# Patient Record
Sex: Male | Born: 1950 | Race: Black or African American | Hispanic: No | Marital: Married | Smoking: Former smoker
Health system: Southern US, Community
[De-identification: ages and names within clinical notes are randomized; demographics above are authoritative.]

## PROBLEM LIST (undated history)

## (undated) DIAGNOSIS — R519 Headache, unspecified: Secondary | ICD-10-CM

## (undated) DIAGNOSIS — J189 Pneumonia, unspecified organism: Secondary | ICD-10-CM

## (undated) DIAGNOSIS — G4730 Sleep apnea, unspecified: Secondary | ICD-10-CM

## (undated) DIAGNOSIS — R972 Elevated prostate specific antigen [PSA]: Secondary | ICD-10-CM

## (undated) DIAGNOSIS — N529 Male erectile dysfunction, unspecified: Secondary | ICD-10-CM

## (undated) DIAGNOSIS — I10 Essential (primary) hypertension: Secondary | ICD-10-CM

## (undated) DIAGNOSIS — N400 Enlarged prostate without lower urinary tract symptoms: Secondary | ICD-10-CM

## (undated) DIAGNOSIS — J181 Lobar pneumonia, unspecified organism: Secondary | ICD-10-CM

## (undated) DIAGNOSIS — R51 Headache: Secondary | ICD-10-CM

## (undated) DIAGNOSIS — Z862 Personal history of diseases of the blood and blood-forming organs and certain disorders involving the immune mechanism: Secondary | ICD-10-CM

## (undated) DIAGNOSIS — T4145XA Adverse effect of unspecified anesthetic, initial encounter: Secondary | ICD-10-CM

## (undated) DIAGNOSIS — E119 Type 2 diabetes mellitus without complications: Secondary | ICD-10-CM

## (undated) DIAGNOSIS — R9389 Abnormal findings on diagnostic imaging of other specified body structures: Principal | ICD-10-CM

## (undated) DIAGNOSIS — T8859XA Other complications of anesthesia, initial encounter: Secondary | ICD-10-CM

## (undated) DIAGNOSIS — K219 Gastro-esophageal reflux disease without esophagitis: Secondary | ICD-10-CM

## (undated) HISTORY — PX: EYE SURGERY: SHX253

## (undated) HISTORY — PX: SHOULDER ARTHROSCOPY: SHX128

## (undated) HISTORY — PX: COLONOSCOPY: SHX174

## (undated) HISTORY — PX: ELBOW SURGERY: SHX618

## (undated) HISTORY — PX: ORTHOPEDIC SURGERY: SHX850

## (undated) HISTORY — PX: PROSTATE BIOPSY: SHX241

## (undated) HISTORY — DX: Pneumonia, unspecified organism: J18.9

## (undated) HISTORY — PX: TONSILLECTOMY: SUR1361

## (undated) HISTORY — DX: Male erectile dysfunction, unspecified: N52.9

## (undated) HISTORY — DX: Lobar pneumonia, unspecified organism: J18.1

## (undated) HISTORY — DX: Abnormal findings on diagnostic imaging of other specified body structures: R93.89

## (undated) HISTORY — PX: WRIST SURGERY: SHX841

## (undated) HISTORY — DX: Personal history of diseases of the blood and blood-forming organs and certain disorders involving the immune mechanism: Z86.2

---

## 2000-11-27 ENCOUNTER — Ambulatory Visit (HOSPITAL_COMMUNITY): Admission: RE | Admit: 2000-11-27 | Discharge: 2000-11-27 | Payer: Self-pay | Admitting: Family Medicine

## 2000-11-27 ENCOUNTER — Encounter: Payer: Self-pay | Admitting: Family Medicine

## 2003-06-06 ENCOUNTER — Ambulatory Visit (HOSPITAL_COMMUNITY): Admission: RE | Admit: 2003-06-06 | Discharge: 2003-06-06 | Payer: Self-pay | Admitting: Orthopedic Surgery

## 2003-06-06 ENCOUNTER — Ambulatory Visit (HOSPITAL_BASED_OUTPATIENT_CLINIC_OR_DEPARTMENT_OTHER): Admission: RE | Admit: 2003-06-06 | Discharge: 2003-06-06 | Payer: Self-pay | Admitting: Orthopedic Surgery

## 2004-05-11 ENCOUNTER — Ambulatory Visit (HOSPITAL_COMMUNITY): Admission: RE | Admit: 2004-05-11 | Discharge: 2004-05-11 | Payer: Self-pay | Admitting: Family Medicine

## 2004-08-27 ENCOUNTER — Ambulatory Visit (HOSPITAL_COMMUNITY): Admission: RE | Admit: 2004-08-27 | Discharge: 2004-08-27 | Payer: Self-pay | Admitting: General Surgery

## 2008-03-20 ENCOUNTER — Ambulatory Visit (HOSPITAL_COMMUNITY): Admission: RE | Admit: 2008-03-20 | Discharge: 2008-03-20 | Payer: Self-pay | Admitting: Orthopedic Surgery

## 2008-03-22 ENCOUNTER — Emergency Department (HOSPITAL_COMMUNITY): Admission: EM | Admit: 2008-03-22 | Discharge: 2008-03-22 | Payer: Self-pay | Admitting: Emergency Medicine

## 2010-06-21 NOTE — Op Note (Signed)
NAME:  Thomas Malone, Thomas Malone                          ACCOUNT NO.:  [id number]   MEDICAL RECORD NO.:  [id number]                   PATIENT TYPE:  AMB   LOCATION:  DSC                                  FACILITY:  [facility]   PHYSICIAN:  Leonides Grills, M.D.                  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   OPERATION PERFORMED:  1. Left subtalar distraction bone block fusion.  2. Left iliac crest bone graft.  3. Left sural nerve neurolysis.  4. Left ankle stress x-rays.   SURGEON:  Leonides Grills, M.D.   ASSISTANT:  Lianne Cure, P.A.   ANESTHESIA:  General endotracheal tube with popliteal block.   ESTIMATED BLOOD LOSS:  Minimal.   TOURNIQUET TIME:  Approximately 1-1/2 hours.   COMPLICATIONS:  None.   DISPOSITION:  Stable to PR.   INDICATIONS FOR PROCEDURE:  The patient is a 59 year old male who sustained  a calcaneus fracture which was treated conservatively.  It went on to  collapse with subtalar joint arthritis with horizontal talus and anterior  ankle impingement.  The patient  has consented for the above procedure.  All  risks which include infection, neurovascular injury, nonunion, malunion,  hardware irritation, hardware failure, persistent pain, worsening pain,  stiffness, arthritis in neighboring joints were all explained, questions  were encouraged and answered.   DESCRIPTION OF PROCEDURE:  The patient was brought to the operating room and  placed in supine position after adequate general endotracheal tube  anesthesia was administered as well as Ancef 1 g IV piggyback.  The left  lower extremity as well as left iliac crest bone graft sites were  prepped  and draped in sterile manner over a proximally placed thigh tourniquet.  The  limb was not gravity exsanguinated.  We initially made a longitudinal  incision over the left iliac crest.  Dissection was carried down through  skin.  Hemostasis was obtained.  Soft tissue was elevated on the inner and  outer table of the graft site.  Two tricortical bone blocks were then  harvested.  Cancellous bone was also harvested as well. Bone wax was then  applied to either side of the graft site.  Hemostasis was obtained.  Deep  tissues were closed with 0 Vicryl.  Subcu was closed with 2-0 Vicryl.  The  skin was closed with 4-0 Monocryl subcuticular stitch.  Steri-Strips were  applied.  The left lower extremity was then gravity exsanguinated.  Tourniquet was elevated to 290 mmHg.  Longitudinal incision midline between  the posterior aspect of the peroneal tendons and the anterolateral aspect of  the Achilles tendon  was then made. Dissection was carried down through skin  and hemostasis was obtained.  Sural nerve was identified and formal  neurolysis was then performed.  This was then retracted out of harm's way  posteriorly.  Hemostasis was obtained.  Posterior capsule was then released  and soft tissue was elevated off the calcaneal tuber on both medial and  lateral aspects of the superior aspect of the calcaneal tuber entering the  subtalar joint.  This was verified under C-arm guidance in the lateral  plane.  Stress x-rays were obtained and showed that we were able to  adequately distract through the subtalar joint using a laminar spreader.  Preoperatively, the patient had calcaneal valgus of about 5, 6 degrees and  it was elected that we would not put a ________ X-fix on.  Once this was  distracted, the subtalar joint was then prepared with a curet and curved  quarter inch Osteotome with a rongeur.  Once this was meticulously debrided  of scar tissue and remaining cartilage, multiple 2 mm  drill holes were  placed on either side of the subtalar joint.  Once this was done, the  cancellous bone was then packed into place.  We then followed this with the  tricortical bone block.  We were able to put one large tricortical bone  block in and we did not place the second block.  Once this was done and  tamped into place, it was relatively tight.  Hindfoot alignment stayed in 5  degrees of valgus.  We then placed two 16 mm threaded 6.5 mm fully threaded  cancellous screws.  This had excellent compression, crossed the arthrodesis  site.  The posterior most screw was in the body, the anterior more screw was  more in the neck of the talus.  The wound was copiously irrigated with  normal saline.  Tourniquet was deflated, hemostasis was obtained.  Screws  were put in through stab wound on the heel as well that was longitudinally  midline at the glabral skin border.  Wounds were closed with 4-0 nylon  suture after the wounds were copiously irrigated with normal saline and  hemostasis was obtained.  Sterile dressing was applied.  Modified Jones  dressing was applied.  The patient was stable to the PR.                                               Leonides Grills, M.D.    PB/MEDQ  D:  06/06/2003  T:  06/07/2003  Job:  [id number]

## 2010-06-21 NOTE — H&P (Signed)
NAMECASMIR, Thomas Malone                ACCOUNT NO.:  [id number]   MEDICAL RECORD NO.:  [id number]          PATIENT TYPE:  AMB   LOCATION:                                FACILITY:  [facility]   PHYSICIAN:  Dalia Heading, M.D.  DATE OF BIRTH:  Oct 19, 1950   DATE OF ADMISSION:  08/27/2004  DATE OF DISCHARGE:  LH                                HISTORY & PHYSICAL   CHIEF COMPLAINT:  Family history of colon carcinoma.   HISTORY OF PRESENT ILLNESS:  The patient is a 60 year old, African-American  male who is referred for endoscopic evaluation.  He needs a colonoscopy for  screening purposes and for family history of colon carcinoma.  Sister was  diagnosed with colon cancer.  No abdominal pain, weight loss, nausea,  vomiting, diarrhea, constipation, melena or hematochezia have been noted.  He had never had a colonoscopy.  There is no history of hemorrhoidal  disease.   PAST MEDICAL HISTORY:  Unremarkable.   PAST SURGICAL HISTORY:  Ankle surgery.   CURRENT MEDICATIONS:  None.   ALLERGIES:  No known drug allergies.   REVIEW OF SYSTEMS:  Noncontributory.   PHYSICAL EXAMINATION:  GENERAL:  Well-developed, well-nourished, black male  in no acute distress.  VITAL SIGNS:  Afebrile, vital signs stable.  LUNGS:  Clear to auscultation with equal breath sounds bilaterally.  HEART:  Regular rate and rhythm without S3, S4 or murmurs.  ABDOMEN:  Soft, nontender, nondistended.  No hepatosplenomegaly or masses  are noted.  RECTAL:  Deferred to the procedure.   IMPRESSION:  Family history of colon carcinoma.   PLAN:  The patient is scheduled for a colonoscopy on August 27, 2004.  The  risks and benefits of the procedure including bleeding and perforation were  fully explained to the patient gaining informed consent.       MAJ/MEDQ  D:  08/20/2004  T:  08/20/2004  Job:  [id number]   cc:   Mila Homer. Sudie Bailey, M.D.  [address] [place], [place] [address]  Fax: [phone]

## 2012-04-16 ENCOUNTER — Emergency Department (HOSPITAL_COMMUNITY)
Admission: EM | Admit: 2012-04-16 | Discharge: 2012-04-16 | Disposition: A | Discharge status: Home / Self Care | Payer: Self-pay | Attending: Emergency Medicine | Admitting: Emergency Medicine

## 2012-04-16 ENCOUNTER — Encounter (HOSPITAL_COMMUNITY): Payer: Self-pay

## 2012-04-16 DIAGNOSIS — R110 Nausea: Secondary | ICD-10-CM | POA: Insufficient documentation

## 2012-04-16 DIAGNOSIS — E869 Volume depletion, unspecified: Secondary | ICD-10-CM | POA: Insufficient documentation

## 2012-04-16 DIAGNOSIS — Z7982 Long term (current) use of aspirin: Secondary | ICD-10-CM | POA: Insufficient documentation

## 2012-04-16 DIAGNOSIS — R197 Diarrhea, unspecified: Secondary | ICD-10-CM | POA: Insufficient documentation

## 2012-04-16 DIAGNOSIS — I10 Essential (primary) hypertension: Secondary | ICD-10-CM | POA: Insufficient documentation

## 2012-04-16 DIAGNOSIS — R42 Dizziness and giddiness: Secondary | ICD-10-CM | POA: Insufficient documentation

## 2012-04-16 DIAGNOSIS — Z79899 Other long term (current) drug therapy: Secondary | ICD-10-CM | POA: Insufficient documentation

## 2012-04-16 DIAGNOSIS — K219 Gastro-esophageal reflux disease without esophagitis: Secondary | ICD-10-CM | POA: Insufficient documentation

## 2012-04-16 HISTORY — DX: Gastro-esophageal reflux disease without esophagitis: K21.9

## 2012-04-16 HISTORY — DX: Essential (primary) hypertension: I10

## 2012-04-16 LAB — CBC
MCH: 27.8 pg (ref 26.0–34.0)
MCV: 79.6 fL (ref 78.0–100.0)
Platelets: 208 10*3/uL (ref 150–400)
RBC: 5.11 MIL/uL (ref 4.22–5.81)

## 2012-04-16 LAB — COMPREHENSIVE METABOLIC PANEL
AST: 20 U/L (ref 0–37)
BUN: 18 mg/dL (ref 6–23)
CO2: 30 mEq/L (ref 19–32)
Calcium: 9.3 mg/dL (ref 8.4–10.5)
Creatinine, Ser: 1.43 mg/dL — ABNORMAL HIGH (ref 0.50–1.35)
GFR calc Af Amer: 60 mL/min — ABNORMAL LOW (ref >90)
GFR calc non Af Amer: 52 mL/min — ABNORMAL LOW (ref >90)
Glucose, Bld: 149 mg/dL — ABNORMAL HIGH (ref 70–99)

## 2012-04-16 LAB — TROPONIN I: Troponin I: <0.3 ng/mL (ref <0.30)

## 2012-04-16 MED ORDER — ONDANSETRON 8 MG PO TBDP: 8.0000 mg | ORAL_TABLET | Freq: Three times a day (TID) | ORAL | Status: DC | PRN

## 2012-04-16 MED ORDER — SODIUM CHLORIDE 0.9 % IV SOLN
1000.0000 mL | INTRAVENOUS | Status: DC
  Administered 2012-04-16: 1000 mL via INTRAVENOUS

## 2012-04-16 MED ORDER — SODIUM CHLORIDE 0.9 % IV SOLN
1000.0000 mL | Freq: Once | INTRAVENOUS | Status: AC
  Administered 2012-04-16: 1000 mL via INTRAVENOUS

## 2012-04-16 MED ORDER — ONDANSETRON HCL 4 MG/2ML IJ SOLN
4.0000 mg | Freq: Once | INTRAMUSCULAR | Status: AC
  Administered 2012-04-16: 4 mg via INTRAVENOUS
  Filled 2012-04-16: qty 2

## 2012-04-16 NOTE — ED Notes (Signed)
Patient walk to restroom with assist, return to room rechecked vitals b/p 145/109. Nurse notified

## 2012-04-16 NOTE — ED Notes (Signed)
Patient lying in bed. No distress. 500 mls remain to infuse from bolus. Will reassess when fluids are completed.

## 2012-04-16 NOTE — ED Notes (Signed)
Pt got up to go to the bathroom and felt dizzy and weak, vomited x 1, states he is feeling better but still feels weak, denies pain

## 2012-04-16 NOTE — ED Notes (Signed)
Patient with no complaints at this time. Respirations even and unlabored. Skin warm/dry. Discharge instructions reviewed with patient at this time. Patient given opportunity to voice concerns/ask questions. IV removed per policy and band-aid applied to site. Patient discharged at this time and left Emergency Department via wheelchair.  

## 2012-04-16 NOTE — ED Provider Notes (Signed)
History     CSN: [id number]  Arrival date & time 04/16/12  0230   First MD Initiated Contact with Patient 04/16/12 0230      Chief Complaint  Patient presents with  . Nausea  . Dizziness    (Consider location/radiation/quality/duration/timing/severity/associated sxs/prior treatment) The history is provided by the patient.   the patient's had nausea and diarrhea for several days.  His had decreased oral intake.  Tonight he went to the bathroom and vomited once and stated this made her feel somewhat better.  He then felt very weak and his wife took his blood pressure and notices systolic blood pressure be 56.  She brought in the emergency room for evaluation.  He denies melena and hematochezia.  No hematemesis.  Chills without documented fever.  Myalgias.  Denies chest pain shortness of breath.  No abdominal pain or tenderness.  Is not on blood thinners.  His symptoms are mild to moderate in severity.  Nothing worsens or improves his symptoms.  He stating he feels somewhat better at this time.  Week ago he had some generalized weakness associated with his nausea.  Never with chest pain or chest pressure.  He was recently started on hydrochlorothiazide one week ago.  His urination has been normal for him  Past Medical History  Diagnosis Date  . Hypertension   . Gastroesophageal reflux     Past Surgical History  Procedure Laterality Date  . Orthopedic surgery      No family history on file.  History  Substance Use Topics  . Smoking status: Never Smoker   . Smokeless tobacco: Not on file  . Alcohol Use: No      Review of Systems  All other systems reviewed and are negative.    Allergies  Voltaren  Home Medications   Current Outpatient Rx  Name  Route  Sig  Dispense  Refill  . aspirin 81 MG tablet   Oral   Take 81 mg by mouth daily.         . benazepril (LOTENSIN) 20 MG tablet   Oral   Take 20 mg by mouth daily.         Marland Kitchen diltiazem (CARDIZEM) 120 MG tablet   Oral   Take 120 mg by mouth 4 (four) times daily.         . hydrochlorothiazide (HYDRODIURIL) 25 MG tablet   Oral   Take 25 mg by mouth daily.         . ranitidine (ZANTAC) 300 MG capsule   Oral   Take 300 mg by mouth every evening.           BP 124/85  Pulse 78  Temp(Src) 98.2 F (36.8 C) (Oral)  Resp 20  Ht 5\' 9"  (1.753 m)  Wt 230 lb (104.327 kg)  BMI 33.95 kg/m2  SpO2 94%  Physical Exam  Nursing note and vitals reviewed. Constitutional: He is oriented to person, place, and time. He appears well-developed and well-nourished.  HENT:  Head: Normocephalic and atraumatic.  Eyes: EOM are normal.  Neck: Normal range of motion.  Cardiovascular: Normal rate, regular rhythm, normal heart sounds and intact distal pulses.   Pulmonary/Chest: Effort normal and breath sounds normal. No respiratory distress.  Abdominal: Soft. He exhibits no distension. There is no tenderness.  Musculoskeletal: Normal range of motion.  Neurological: He is alert and oriented to person, place, and time.  Skin: Skin is warm and dry.  Psychiatric: He has a normal mood and affect.  Judgment normal.    ED Course  Procedures (including critical care time)   Date: 04/16/2012  Rate: 73  Rhythm: normal sinus rhythm  QRS Axis: normal  Intervals: normal  ST/T Wave abnormalities: normal  Conduction Disutrbances: none  Narrative Interpretation:   Old EKG Reviewed: No significant changes noted     Labs Reviewed  COMPREHENSIVE METABOLIC PANEL - Abnormal; Notable for the following:    Glucose, Bld 149 (*)    Creatinine, Ser 1.43 (*)    GFR calc non Af Amer 52 (*)    GFR calc Af Amer 60 (*)    All other components within normal limits  CBC  TROPONIN I   No results found.   1. Nausea   2. Diarrhea   3. Volume depletion       MDM  5:19 AM The patient is feeling better at this time.  Later in the emergency department by difficulty.  Likely volume sedation.  Abdominal exam benign.   Nausea vomiting and diarrhea.  Until the patient's symptoms resolve I will have him hold his hydrochlorothiazide until he is eating and drinking more normally.        Lyanne Co, MD 04/16/12 [phone]

## 2012-04-16 NOTE — ED Notes (Signed)
Patient continues to c/o nausea after he ambulated around department.  Patient is receiving 2nd liter of NS.  Dr. Patria Mane notified and stated for patient to remain for 3rd liter of NS and then reassess for discharge.

## 2012-04-16 NOTE — ED Notes (Signed)
Patient ambulated in hallway with steady gait and without difficulty. Stating he feels better.

## 2014-01-17 ENCOUNTER — Other Ambulatory Visit (HOSPITAL_COMMUNITY): Payer: Self-pay | Admitting: Preventative Medicine

## 2014-01-17 DIAGNOSIS — R9389 Abnormal findings on diagnostic imaging of other specified body structures: Secondary | ICD-10-CM

## 2014-01-23 ENCOUNTER — Ambulatory Visit (HOSPITAL_COMMUNITY)
Admission: RE | Admit: 2014-01-23 | Discharge: 2014-01-23 | Disposition: A | Discharge status: Home / Self Care | Payer: BC Managed Care – PPO | Source: Ambulatory Visit | Attending: Preventative Medicine | Admitting: Preventative Medicine

## 2014-01-23 DIAGNOSIS — R590 Localized enlarged lymph nodes: Secondary | ICD-10-CM | POA: Insufficient documentation

## 2014-01-23 DIAGNOSIS — J189 Pneumonia, unspecified organism: Secondary | ICD-10-CM | POA: Insufficient documentation

## 2014-01-23 DIAGNOSIS — R9389 Abnormal findings on diagnostic imaging of other specified body structures: Secondary | ICD-10-CM

## 2014-01-23 DIAGNOSIS — R918 Other nonspecific abnormal finding of lung field: Secondary | ICD-10-CM | POA: Diagnosis present

## 2014-01-23 HISTORY — DX: Abnormal findings on diagnostic imaging of other specified body structures: R93.89

## 2014-01-23 LAB — POCT I-STAT CREATININE: Creatinine, Ser: 1.5 mg/dL — ABNORMAL HIGH (ref 0.50–1.35)

## 2014-01-23 MED ORDER — IOHEXOL 300 MG/ML  SOLN
80.0000 mL | Freq: Once | INTRAMUSCULAR | Status: AC | PRN
  Administered 2014-01-23: 80 mL via INTRAVENOUS

## 2014-02-09 ENCOUNTER — Encounter: Payer: Self-pay | Admitting: *Deleted

## 2014-02-09 DIAGNOSIS — I10 Essential (primary) hypertension: Secondary | ICD-10-CM | POA: Insufficient documentation

## 2014-02-09 DIAGNOSIS — K219 Gastro-esophageal reflux disease without esophagitis: Secondary | ICD-10-CM | POA: Insufficient documentation

## 2014-02-09 DIAGNOSIS — J181 Lobar pneumonia, unspecified organism: Secondary | ICD-10-CM

## 2014-02-09 DIAGNOSIS — Z862 Personal history of diseases of the blood and blood-forming organs and certain disorders involving the immune mechanism: Secondary | ICD-10-CM | POA: Insufficient documentation

## 2014-02-09 DIAGNOSIS — J189 Pneumonia, unspecified organism: Secondary | ICD-10-CM

## 2014-02-09 DIAGNOSIS — N529 Male erectile dysfunction, unspecified: Secondary | ICD-10-CM | POA: Insufficient documentation

## 2014-02-09 DIAGNOSIS — R9389 Abnormal findings on diagnostic imaging of other specified body structures: Secondary | ICD-10-CM

## 2014-02-10 ENCOUNTER — Encounter: Payer: Self-pay | Admitting: Thoracic Surgery (Cardiothoracic Vascular Surgery)

## 2014-02-10 ENCOUNTER — Institutional Professional Consult (permissible substitution) (INDEPENDENT_AMBULATORY_CARE_PROVIDER_SITE_OTHER): Payer: 59 | Admitting: Thoracic Surgery (Cardiothoracic Vascular Surgery)

## 2014-02-10 VITALS — BP 127/86 | HR 70 | Resp 20 | Ht 69.0 in | Wt 235.0 lb

## 2014-02-10 DIAGNOSIS — R599 Enlarged lymph nodes, unspecified: Secondary | ICD-10-CM

## 2014-02-10 DIAGNOSIS — R590 Localized enlarged lymph nodes: Secondary | ICD-10-CM

## 2014-02-10 NOTE — Progress Notes (Signed)
PCP is Robert Bellow, MD Referring Provider is Alonza Bogus, MD  Chief Complaint  Patient presents with  . Lymphadenopathy    Surgical eval,bilateral hilar and mediastinal lymphadenopathy, Chest CT 01/23/14    HPI: 64 yo man sent for consultation regarding mediastinal and hilar adenopathy.  Thomas Malone is a 64 year old gentleman who is a lifelong nonsmoker. His past medical history is significant for hypertension, sarcoidosis, and a left upper lobe pneumonia in November 2015.  He was diagnosed with sarcoidosis about 25 years ago. He said this was diagnosed bronchoscopically. He was treated with steroids for about 3 months and did not have any further problems with it.  He had about a 3 month history of cough leading up to eating diagnosed with pneumonia in November 2015. When he finally was diagnosed he was having fevers, chills, and night sweats. He was treated with antibiotics and steroids and had rapid improvement. A chest x-ray initially showed left upper lobe pneumonia. He was followed with x-rays in the left upper lobe pneumonia was resolving but on his chest x-ray and early December there was a question of a right hilar mass. He had a CT on 01/23/2014 which showed mediastinal and bilateral hilar adenopathy. There was a resolving left upper lobe pneumonia but no lung mass.  He says he still has an occasional nonproductive cough. He has long-standing wheezing, which has not changed recently. He is not having any fevers chills or night sweats. He lost 25 pounds when he was ill with pneumonia, but has gained most of that back.   Past Medical History  Diagnosis Date  . Erectile dysfunction   . H/O sarcoidosis   . Gastroesophageal reflux   . Hypertension   . Abnormal CT of the chest 01/23/14  . Pneumonia of upper lobe of lung 12/1213    Past Surgical History  Procedure Laterality Date  . Orthopedic surgery      heel surgery  . Tonsillectomy      Family History  Problem  Relation Age of Onset  . Cancer Mother   . Heart disease Father   . Hyperlipidemia Sister   . Diabetes Sister   . Heart disease Sister   . Hyperlipidemia Brother   . Diabetes Brother     Social History History  Substance Use Topics  . Smoking status: Never Smoker   . Smokeless tobacco: Not on file  . Alcohol Use: No    Current Outpatient Prescriptions  Medication Sig Dispense Refill  . aspirin 81 MG tablet Take 81 mg by mouth daily.    . benazepril (LOTENSIN) 20 MG tablet Take 20 mg by mouth daily.    . hydrochlorothiazide (HYDRODIURIL) 25 MG tablet Take 25 mg by mouth daily. TAKES 1/2 TABLET PER DAY (12.5 mg)    . HYDROcodone-acetaminophen (NORCO) 7.5-325 MG per tablet Take 1 tablet by mouth every 6 (six) hours as needed for moderate pain.    . ranitidine (ZANTAC) 300 MG capsule Take 300 mg by mouth every evening.    . tadalafil (CIALIS) 20 MG tablet Take 20 mg by mouth daily as needed for erectile dysfunction.     No current facility-administered medications for this visit.    Allergies  Allergen Reactions  . Voltaren [Diclofenac Sodium]     Topical gel    Review of Systems  Constitutional: Positive for unexpected weight change (lost 25 pounds with pneumonia, has gained most of it back). Negative for fever, chills, diaphoresis, activity change and appetite change.  Respiratory:  Positive for cough (nonproductive) and wheezing. Negative for shortness of breath.   Cardiovascular: Negative for chest pain.  Gastrointestinal:       Reflux  Genitourinary: Positive for frequency.  Musculoskeletal: Positive for arthralgias.  All other systems reviewed and are negative.   BP 127/86 mmHg  Pulse 70  Resp 20  Ht 5\' 9"  (1.753 m)  Wt 235 lb (106.595 kg)  BMI 34.69 kg/m2  SpO2 98% Physical Exam  Constitutional: He is oriented to person, place, and time. He appears well-developed and well-nourished. No distress.  HENT:  Head: Normocephalic and atraumatic.  Eyes: EOM are  normal. Pupils are equal, round, and reactive to light.  Neck: Neck supple. No thyromegaly present.  Cardiovascular: Normal rate, regular rhythm, normal heart sounds and intact distal pulses.  Exam reveals no gallop and no friction rub.   No murmur heard. Pulmonary/Chest: Effort normal and breath sounds normal. He has no wheezes. He has no rales.  Abdominal: Soft. There is no tenderness.  Musculoskeletal: He exhibits no edema.  Lymphadenopathy:    He has no cervical adenopathy.  Neurological: He is alert and oriented to person, place, and time. No cranial nerve deficit.  Skin: Skin is warm and dry.  Vitals reviewed.    Diagnostic Tests: CT CHEST WITH CONTRAST  TECHNIQUE: Multidetector CT imaging of the chest was performed during intravenous contrast administration.  CONTRAST: 160 cc OMNIPAQUE IOHEXOL 300 MG/ML SOLN  COMPARISON: Chest radiography 01/13/2014  FINDINGS: The initial scan showed contrast within the left circulation. There was insufficient contrast within the pulmonary vasculature to accurately evaluate the right hilum. For that reason, the patient was returned and re-scanned with better bolus timing.  There is some persistent infiltrate in the left upper lobe consistent with resolving pneumonia. No evidence of complicating feature. The lungs are otherwise clear. There is no evidence of nodule or mass and any other location. Specifically, no right lower lung lesion.  The patient does have bilateral hilar and mediastinal lymphadenopathy. There are multiple lymph nodes within the right hilum. At the inferior hilum, there is a 16 x 26 mm node. In the superior right hilum, there is a 27 x 22 mm node. Other abnormal nodal tissue is present throughout the hilar regions bilaterally, though less easily measured. Aorta pulmonary window nodes are present, the largest measuring 23 x 13 mm. No enlarged axillary nodes are seen.  No pleural or pericardial fluid.  Scans in the upper abdomen show cyst in the upper pole the right kidney but no other finding of note.  IMPRESSION: Resolving left upper lobe pneumonia. Some infiltrate does persist in that region.  Abnormal nodal tissue in both hilar regions and in the mediastinum, most prominent in the hilum on the right. These nodes could be reactive to inflammatory disease, but the diffuse pattern does raise at least the possibility of lymphoma. One might consider re-evaluation after further treatment of the resolving left upper lobe pneumonia.   Electronically Signed  By: Nelson Chimes M.D.  On: 01/23/2014 14:32   Impression: 64 year old man with a history of sarcoidosis many years ago, who had a recent left upper lobe pneumonia. He has bilateral hilar and mediastinal adenopathy on CT. The hilar adenopathy is a more impressive than the mediastinal adenopathy.  The differential diagnosis includes reactive adenopathy secondary to his recent pneumonia, sarcoidosis, and lymphoma. I long discussion with Thomas Malone and reviewed the films with them. We discussed the differential diagnosis as well as various ways of making a  definitive diagnosis. Most of his adenopathy is hilar. He does have some mediastinal adenopathy but it is not as prominent. Unfortunately, endobronchial ultrasound is very poor at making the diagnosis of sarcoidosis and cannot make a diagnosis of lymphoma. It would be difficult, if not impossible, to reach the majority of his nodes with mediastinoscopy. There also is a possibility that some of this adenopathy is chronic due to his sarcoidosis. A thoracoscopic approach is always possible to biopsy the lymph nodes but is a bigger operation.  My recommendation was that we re-scan his lymph nodes. It's been about 3 weeks since his previous scan. I think a PET/CT would be most helpful as that could tell us if there is an active process in the nodes and also help direct [place] as to  where to biopsy should that be necessary. If there is any reactive component to the adenopathy at should be resolved by that time. Thomas Malone are in agreement with that plan  Plan:  Return in 3 weeks with PET CT

## 2014-02-13 ENCOUNTER — Other Ambulatory Visit: Payer: Self-pay | Admitting: *Deleted

## 2014-02-13 DIAGNOSIS — R590 Localized enlarged lymph nodes: Secondary | ICD-10-CM

## 2014-03-07 ENCOUNTER — Encounter (HOSPITAL_COMMUNITY)
Admission: RE | Admit: 2014-03-07 | Discharge: 2014-03-07 | Disposition: A | Discharge status: Home / Self Care | Payer: 59 | Source: Ambulatory Visit | Attending: Thoracic Surgery (Cardiothoracic Vascular Surgery) | Admitting: Thoracic Surgery (Cardiothoracic Vascular Surgery)

## 2014-03-07 ENCOUNTER — Encounter (HOSPITAL_COMMUNITY): Payer: Self-pay

## 2014-03-07 ENCOUNTER — Ambulatory Visit (INDEPENDENT_AMBULATORY_CARE_PROVIDER_SITE_OTHER): Payer: 59 | Admitting: Thoracic Surgery (Cardiothoracic Vascular Surgery)

## 2014-03-07 ENCOUNTER — Encounter: Payer: Self-pay | Admitting: Thoracic Surgery (Cardiothoracic Vascular Surgery)

## 2014-03-07 VITALS — BP 120/80 | HR 65 | Resp 18 | Ht 69.0 in | Wt 235.0 lb

## 2014-03-07 DIAGNOSIS — R590 Localized enlarged lymph nodes: Secondary | ICD-10-CM

## 2014-03-07 DIAGNOSIS — R599 Enlarged lymph nodes, unspecified: Secondary | ICD-10-CM | POA: Diagnosis not present

## 2014-03-07 LAB — GLUCOSE, CAPILLARY: GLUCOSE-CAPILLARY: 122 mg/dL — AB (ref 70–99)

## 2014-03-07 MED ORDER — FLUDEOXYGLUCOSE F - 18 (FDG) INJECTION
12.4000 | Freq: Once | INTRAVENOUS | Status: AC | PRN
  Administered 2014-03-07: 12.4 via INTRAVENOUS

## 2014-03-07 NOTE — Progress Notes (Signed)
HPI:  64 yo man sent for consultation regarding mediastinal and hilar adenopathy.  Thomas Malone is a 64 year old gentleman who is a lifelong nonsmoker. His past medical history is significant for hypertension, sarcoidosis, and a left upper lobe pneumonia in November 2015.  He was diagnosed with sarcoidosis about 25 years ago. He was treated with steroids for about 3 months and did not have any further issues.  He had a 3 month history of cough beginning in August 2015. He then began having fevers, chills, and night sweats. He was diagnosed with pneumonia in November 2015. He was treated with antibiotics and steroids and had rapid symptomatic improvement. A chest x-ray initially showed left upper lobe pneumonia. He was followed with x-rays and the left upper lobe pneumonia was resolving, but on his chest x-ray in early December there was a question of a right hilar mass. He had a CT on 01/23/2014 which showed mediastinal and bilateral hilar adenopathy. There was a resolving left upper lobe pneumonia but no lung mass.  Since his last visit he has felt fine. He denies, fevers, chills, sweats, shortness of breath, weight loss, change in appetite, and chest pain. He had a PET/CT today.  Past Medical History  Diagnosis Date  . Erectile dysfunction   . H/O sarcoidosis   . Gastroesophageal reflux   . Hypertension   . Abnormal CT of the chest 01/23/14  . Pneumonia of upper lobe of lung 12/1213   Past Surgical History  Procedure Laterality Date  . Orthopedic surgery      heel surgery  . Tonsillectomy      Current Outpatient Prescriptions  Medication Sig Dispense Refill  . aspirin 81 MG tablet Take 81 mg by mouth daily.    . benazepril (LOTENSIN) 20 MG tablet Take 20 mg by mouth daily.    . hydrochlorothiazide (HYDRODIURIL) 25 MG tablet Take 25 mg by mouth daily. TAKES 1/2 TABLET PER DAY (12.5 mg)    . HYDROcodone-acetaminophen (NORCO) 7.5-325 MG per tablet Take 1 tablet by mouth every 6 (six)  hours as needed for moderate pain.    . pantoprazole (PROTONIX) 40 MG tablet Take 40 mg by mouth daily.    . tadalafil (CIALIS) 20 MG tablet Take 20 mg by mouth daily as needed for erectile dysfunction.     No current facility-administered medications for this visit.    Physical Exam BP 120/80 mmHg  Pulse 65  Resp 18  Ht 5\' 9"  (1.753 m)  Wt 235 lb (106.595 kg)  BMI 34.69 kg/m2  SpO51 99%  64 year old man in no acute distress  well-developed well-nourished Alert and oriented 3 with no focal neurologic deficits No cervical or subclavicular adenopathy  lungs clear with equal breath sounds bilaterally  cardiac regular rate and rhythm normal S1 and S2  Diagnostic Tests: CLINICAL DATA: Initial encounter for treatment strategy for staging of lymphoma. Mediastinal adenopathy. History of sarcoidosis.  EXAM: NUCLEAR MEDICINE PET SKULL BASE TO THIGH  TECHNIQUE: 12.4 mCi F-18 FDG was injected intravenously. Full-ring PET imaging was performed from the skull base to thigh after the radiotracer. CT data was obtained and used for attenuation correction and anatomic localization.  FASTING BLOOD GLUCOSE: Value: 120 mg/dl  COMPARISON: 01/23/2014 chest CT  FINDINGS: NECK  No areas of abnormal hypermetabolism.  CHEST  Low level hypermetabolism corresponds to persistent posterior left upper lobe architectural distortion and peribronchovascular nodularity. This measures a S.U.V. max of 1.9, including on image 22 of series 9.  Hypermetabolic thoracic adenopathy. A  left suprahilar hypermetabolic node measures 1.1 cm on the prior diagnostic CT and a S.U.V. max of 5.1 on image 75.  Right infrahilar node measures 1.2 cm and a S.U.V. max of 6.0 on image 85.  Medial AP window node measures 1.0 cm and a S.U.V. max of 5.6 on image 71.  ABDOMEN/PELVIS  No areas of abnormal hypermetabolism.  SKELETON  No abnormal marrow activity.  CT IMAGES PERFORMED FOR  ATTENUATION CORRECTION  Left maxillary sinus mucous retention cyst or polyp. Left and probable right sided mastoid effusion. No cervical adenopathy.  Other chest findings deferred to recent diagnostic CT.  Interpolar right renal cyst. No abdominopelvic adenopathy. Moderate prostatomegaly.  IMPRESSION: 1. Combination of findings, including hypermetabolic persistent left upper lobe architectural distortion and thoracic adenopathy. Favored to represent sarcoidosis. Lymphoma cannot be entirely excluded but is felt less likely. 2. No evidence of extrathoracic hypermetabolism. 3. Incidental findings, including left larger than right mastoid effusions.   Electronically Signed  By: Abigail Miyamoto M.D.  On: 03/07/2014 09:40   Impression:  64 year old gentleman with a pneumonia about 3 months ago. He also has a history of sarcoidosis diagnosed about 25 years ago. He had some mild hilar and mediastinal adenopathy on CT in the setting of a resolving pneumonia. Now 6 weeks later he had a PET/CT for comparison purposes.   I reviewed the CT and PET/CT personally and also reviewed the films with Mr. and Mrs. Malone.  I agree with the radiologist's interpretation.    the PET CT shows continued slow resolution of the left upper lobe infiltrate. There is some mild hypermetabolic activity consistent with residual inflammation in the area. His lymph nodes are essentially unchanged in that interval. There is some hypermetabolism , that is favored to represent sarcoidosis. I think lymphoma is unlikely.   I reviewed the findings and discussed their implications with Thomas Malone.  They do understand that we cannot completely rule out the possibility of lymphoma based on the current scans.   The lymph nodes are not easily accessible. The hilar nodes could be accessed with endobronchial ultrasound, but that is very unlikely to diagnose either sarcoidosis or lymphoma.  In all likelihood he would  require a VATS with a hospital stay and significant recovery period.  Given that he has known documented sarcoidosis, I do not think the risk-benefit profile favors biopsy at this point as there is only a small possibility that this represents lymphoma.  My recommendation to Thomas Malone that we  Repeat his CT scan in about 3 months to see if there is any progression of the lymph nodes which would warrant an intervention. He is agreeable to that plan.  Plan: Return in 3 months with CT of chest   Montez Morita.D.

## 2014-05-29 ENCOUNTER — Other Ambulatory Visit: Payer: Self-pay | Admitting: *Deleted

## 2014-05-29 DIAGNOSIS — R590 Localized enlarged lymph nodes: Secondary | ICD-10-CM

## 2014-05-29 NOTE — H&P (Signed)
  NTS SOAP Note  Vital Signs:  Vitals as of: 4/97/0263: Systolic 785: Diastolic 92: Heart Rate 72: Temp 97.27F: Height 90ft 9.5in: Weight 232Lbs 0 Ounces: BMI 33.77  BMI : 33.77 kg/m2  Subjective: This 64 year old male presents for of need for screening TCS.  Last had a colonoscopy over 10 years ago.  No gi complaints.  No family h/o colon cancer.  Review of Symptoms:  Constitutional:unremarkable   Head:unremarkable Eyes:unremarkable   Nose/Mouth/Throat:unremarkable Cardiovascular:  unremarkable Respiratory:unremarkable Gastrointestinal:  unremarkable   Genitourinary:unremarkable   Musculoskeletal:unremarkable Skin:unremarkable Hematolgic/Lymphatic:unremarkable   hay fever   Past Medical History:  Reviewed  Past Medical History  Medical Problems: HTN Allergies: voltaren Medications: benazepril,  HCTZ,  baby asa   Social History:Reviewed  Social History  Preferred Language: English Race:  Black or African American Ethnicity: Not Hispanic / Latino Age: 64 year Marital Status:  M   Smoking Status: Never smoker reviewed on 05/23/2014 Functional Status reviewed on 05/23/2014 ------------------------------------------------ Bathing: Normal Cooking: Normal Dressing: Normal Driving: Normal Eating: Normal Managing Meds: Normal Oral Care: Normal Shopping: Normal Toileting: Normal Transferring: Normal Walking: Normal Cognitive Status reviewed on 05/23/2014 ------------------------------------------------ Attention: Normal Decision Making: Normal Language: Normal Memory: Normal Motor: Normal Perception: Normal Problem Solving: Normal Visual and Spatial: Normal   Family History:Reviewed  Family Health History Mother, Deceased; Lymphoma;  Father, Deceased; Heart attack (myocardial infarction);     Objective Information: General:Well appearing, well nourished in no distress. Heart:RRR, no murmur Lungs:  CTA bilaterally, no  wheezes, rhonchi, rales.  Breathing unlabored. Abdomen:Soft, NT/ND, no HSM, no masses. deferred to procedure  Assessment:Need for screening TCS  Diagnoses: V76.51  Z12.11 Screening for malignant neoplasm of colon (Encounter for screening for malignant neoplasm of colon)  Procedures: 88502 - OFFICE OUTPATIENT NEW 20 MINUTES    Plan:  Scheduled for TCS on 06/23/14.   Patient Education:Alternative treatments to surgery were discussed with patient (and family).  Risks and benefits  of procedure including bleeding and perforation were fully explained to the patient (and family) who gave informed consent. Patient/family questions were addressed.  Follow-up:Pending Surgery

## 2014-06-13 ENCOUNTER — Ambulatory Visit (INDEPENDENT_AMBULATORY_CARE_PROVIDER_SITE_OTHER): Payer: 59 | Admitting: Thoracic Surgery (Cardiothoracic Vascular Surgery)

## 2014-06-13 ENCOUNTER — Encounter: Payer: Self-pay | Admitting: Thoracic Surgery (Cardiothoracic Vascular Surgery)

## 2014-06-13 ENCOUNTER — Ambulatory Visit
Admission: RE | Admit: 2014-06-13 | Discharge: 2014-06-13 | Disposition: A | Discharge status: Home / Self Care | Payer: 59 | Source: Ambulatory Visit | Attending: Thoracic Surgery (Cardiothoracic Vascular Surgery) | Admitting: Thoracic Surgery (Cardiothoracic Vascular Surgery)

## 2014-06-13 DIAGNOSIS — R599 Enlarged lymph nodes, unspecified: Secondary | ICD-10-CM

## 2014-06-13 DIAGNOSIS — R590 Localized enlarged lymph nodes: Secondary | ICD-10-CM

## 2014-06-13 NOTE — Progress Notes (Signed)
[address]       [place] [address]             [phone]       HPI: Thomas Malone returns today for a scheduled 3 month follow-up visit.  He is a 64 year old gentleman. His past medical history is significant for hypertension, sarcoidosis, and a left upper lobe pneumonia in November 2015. He is a lifelong nonsmoker.  He was diagnosed with sarcoidosis about 25 years ago. He was treated with steroids for about 3 months and did not have any further issues.  He had a 3 month history of cough beginning in August 2015. He then began having fevers, chills, and night sweats. He was diagnosed with pneumonia in November 2015. He was treated with antibiotics and steroids and had rapid symptomatic improvement. A chest x-ray initially showed left upper lobe pneumonia. He was followed with x-rays and the left upper lobe pneumonia was resolving, but on his chest x-ray in early December there was a question of a right hilar mass. He had a CT on 01/23/2014 which showed mediastinal and bilateral hilar adenopathy. There was a resolving left upper lobe pneumonia but no lung mass.  He had a PET/CT in February which showed persistent hilar and mediastinal adenopathy. The nodes were hypermetabolic. The left upper lobe infiltrate was resolving but did still have some metabolic activity. There were no easily accessible nodes for mediastinal endoscopy. I discussed possible VATS for node biopsy, but he was not interested in pursuing that. Ultimately we decided to see him back with a repeat scan in 3 months.  He says that since his last visit he's been feeling well. He denies cough, wheezing, shortness of breath, chest pain, fever, chills, night sweats, change in appetite or weight loss.  Past Medical History  Diagnosis Date  . Erectile dysfunction   . H/O sarcoidosis   . Gastroesophageal reflux   . Hypertension   . Abnormal CT of the chest 01/23/14  . Pneumonia of upper lobe of lung 12/1213        Current Outpatient Prescriptions  Medication Sig Dispense Refill  . aspirin 81 MG tablet Take 81 mg by mouth daily.    . benazepril (LOTENSIN) 20 MG tablet Take 20 mg by mouth daily.    Marland Kitchen glucosamine-chondroitin 500-400 MG tablet Take 2 tablets by mouth daily.    . hydrochlorothiazide (HYDRODIURIL) 25 MG tablet Take 25 mg by mouth daily. TAKES 1/2 TABLET PER DAY (12.5 mg)    . omeprazole (PRILOSEC) 40 MG capsule Take 40 mg by mouth daily.    . tadalafil (CIALIS) 20 MG tablet Take 20 mg by mouth daily as needed for erectile dysfunction.     No current facility-administered medications for this visit.    Physical Exam BP 133/88 mmHg  Pulse 73  Resp 16  Ht 5\' 9"  (1.753 m)  Wt 232 lb (105.235 kg)  BMI 34.24 kg/m2  SpO70 76% 64 year old male in no acute distress Alert and oriented 3 with no focal deficits No cervical or subclavicular adenopathy Lungs clear with equal breath sounds bilaterally, no wheezing Cardiac regular rate and rhythm normal S1 and S2 No peripheral edema  Diagnostic Tests: CT CHEST WITHOUT CONTRAST  TECHNIQUE: Multidetector CT imaging of the chest was performed following the standard protocol without IV contrast.  COMPARISON: CT 01/23/2014, PET-CT 03/07/2014.  FINDINGS: Mediastinum/Nodes: No axillary or supraclavicular lymphadenopathy. There is mild perihilar adenopathy which is difficult to assess on this  noncontrast exam but does not appear changed from comparison. Example 18 mm right infrahilar node not changed from 18 mm (image 30 series 3). Small paratracheal lymph nodes are again noted. No pericardial fluid. Esophagus normal.  Lungs/Pleura: Interval improvement in the bronchiectasis and peripheral reticulation and atelectasis in the left upper lobe (images 18-25 of series 4).  Small subpleural nodule along the left oblique fissure measuring 2 mm (image 30, series 4). Potential small 3 mm subpleural nodule left lower lobe on 43.  Left upper lobe nodule measuring 3 mm on image 25, series 4 is unchanged  Upper abdomen: Limited view of the liver, kidneys, pancreas are unremarkable. Normal adrenal glands.  Musculoskeletal: No aggressive osseous. Degenerate spurring spine.  IMPRESSION: 1. No change in mild bilateral hilar lymphadenopathy. 2. Interval improvement in atelectasis and reticulation in left upper lobe. There is mild band of atelectasis and mild bronchiectasis remaining. 3. Scattered small sub 5 mm parenchymal and subpleural nodules are unchanged and likely related to sarcoidosis.   Electronically Signed  By: Suzy Bouchard M.D.  On: 06/13/2014 11:56   Impression: 64 year old man with a remote history of sarcoidosis who has bilateral hilar adenopathy. He had pneumonia last winter. He had a left upper lobe infiltrate and bilateral hilar adenopathy. There also was some AP window adenopathy. He improved clinically with antibiotics, but had persistent radiographic findings. A PET CT in February showed the adenopathy was unchanged. It was hypermetabolic. There also was some residual metabolic activity in the left upper lobe region as well.  I reviewed his scan today. I also reviewed his side-by-side with the scan from December as well as the PET CT. I reviewed the scans with Mr. and Thomas Malone as well. His lymphadenopathy is unchanged. There has been continued improvement in nearly complete resolution of his left upper lobe infiltrate.  I suspect that his adenopathy is due to persistent subclinical sarcoidosis. Given the absence of progression in 6 months I think lymphoma is very unlikely. I do think we should repeat his scan in about 6 months to make sure that there is no progression.  He does know to call if he develops fever, chills, night sweats, cough, wheezing, or shortness of breath.  Plan:  Return in 6 months with CT of chest  [name], MD [facility] [phone]

## 2014-06-23 ENCOUNTER — Encounter (HOSPITAL_COMMUNITY): Payer: Self-pay | Admitting: *Deleted

## 2014-06-23 ENCOUNTER — Encounter (HOSPITAL_COMMUNITY)
Admission: RE | Disposition: A | Discharge status: Home / Self Care | Payer: Self-pay | Source: Ambulatory Visit | Attending: General Surgery

## 2014-06-23 ENCOUNTER — Ambulatory Visit (HOSPITAL_COMMUNITY)
Admission: RE | Admit: 2014-06-23 | Discharge: 2014-06-23 | Disposition: A | Discharge status: Home / Self Care | Payer: 59 | Source: Ambulatory Visit | Attending: General Surgery | Admitting: General Surgery

## 2014-06-23 DIAGNOSIS — Z7982 Long term (current) use of aspirin: Secondary | ICD-10-CM | POA: Insufficient documentation

## 2014-06-23 DIAGNOSIS — Z1211 Encounter for screening for malignant neoplasm of colon: Secondary | ICD-10-CM | POA: Diagnosis present

## 2014-06-23 DIAGNOSIS — Z79899 Other long term (current) drug therapy: Secondary | ICD-10-CM | POA: Insufficient documentation

## 2014-06-23 DIAGNOSIS — I10 Essential (primary) hypertension: Secondary | ICD-10-CM | POA: Diagnosis not present

## 2014-06-23 DIAGNOSIS — K648 Other hemorrhoids: Secondary | ICD-10-CM | POA: Insufficient documentation

## 2014-06-23 DIAGNOSIS — K644 Residual hemorrhoidal skin tags: Secondary | ICD-10-CM | POA: Diagnosis not present

## 2014-06-23 HISTORY — PX: COLONOSCOPY: SHX5424

## 2014-06-23 SURGERY — COLONOSCOPY
Anesthesia: Moderate Sedation

## 2014-06-23 MED ORDER — MIDAZOLAM HCL 5 MG/5ML IJ SOLN
INTRAMUSCULAR | Status: DC | PRN
  Administered 2014-06-23: 3 mg via INTRAVENOUS

## 2014-06-23 MED ORDER — MEPERIDINE HCL 100 MG/ML IJ SOLN
INTRAMUSCULAR | Status: AC
  Filled 2014-06-23: qty 1

## 2014-06-23 MED ORDER — SODIUM CHLORIDE 0.9 % IV SOLN
INTRAVENOUS | Status: DC
  Administered 2014-06-23: 07:00:00 via INTRAVENOUS

## 2014-06-23 MED ORDER — MIDAZOLAM HCL 5 MG/5ML IJ SOLN
INTRAMUSCULAR | Status: AC
  Filled 2014-06-23: qty 5

## 2014-06-23 MED ORDER — MEPERIDINE HCL 50 MG/ML IJ SOLN
INTRAMUSCULAR | Status: DC | PRN
  Administered 2014-06-23: 50 mg

## 2014-06-23 NOTE — Op Note (Signed)
[facility] [address] [place], [address]   COLONOSCOPY PROCEDURE REPORT     EXAM DATE: Jul 10, 2014  PATIENT NAME:      Thomas Malone, Thomas Malone           MR #:      [id number]  BIRTHDATE:       1950-02-11      VISIT #:     [phone]  ATTENDING:     Aviva Signs, MD     STATUS:     outpatient ASSISTANT:  INDICATIONS:  The patient is a 64 yr old male here for a colonoscopy due to average risk patient for colon cancer. PROCEDURE PERFORMED:     Colonoscopy, screening MEDICATIONS:     Demerol 50 mg IV and Versed 3 mg IV ESTIMATED BLOOD LOSS:     None  CONSENT: The patient understands the risks and benefits of the procedure and understands that these risks include, but are not limited to: sedation, allergic reaction, infection, perforation and/or bleeding. Alternative means of evaluation and treatment include, among others: physical exam, x-rays, and/or surgical intervention. The patient elects to proceed with this endoscopic procedure.  DESCRIPTION OF PROCEDURE: During intra-op preparation period all mechanical & medical equipment was checked for proper function. Hand hygiene and appropriate measures for infection prevention was taken. After the risks, benefits and alternatives of the procedure were thoroughly explained, Informed consent was verified, confirmed and timeout was successfully executed by the treatment team. A digital exam revealed external hemorrhoids. The [id number] ([id number]) endoscope was introduced through the anus and advanced to the cecum, which was identified by both the appendix and ileocecal valve. adequate (Trilyte was used) The instrument was then slowly withdrawn as the colon was fully examined.Estimated blood loss is zero unless otherwise noted in this procedure report.   COLON FINDINGS: A normal appearing cecum, ileocecal valve, and appendiceal orifice were identified.  The ascending, transverse, descending, sigmoid colon, and  rectum appeared unremarkable. Retroflexed views revealed internal hemorrhoids. The scope was then completely withdrawn from the patient and the procedure terminated.  SCOPE WITHDRAWAL TIME: 6    ADVERSE EVENTS:      There were no immediate complications.  IMPRESSIONS:     Normal colonoscopy  RECOMMENDATIONS:     Repeat Colonscopy in 10 years. RECALL:  _____________________________ Aviva Signs, MD eSigned:  Aviva Signs, MD 2014/07/10 7:49 AM   cc:   CPT CODES: ICD CODES:  The ICD and CPT codes recommended by this software are interpretations from the data that the clinical staff has captured with the software.  The verification of the translation of this report to the ICD and CPT codes and modifiers is the sole responsibility of the health care institution and practicing physician where this report was generated.  [employer] will not be held responsible for the validity of the ICD and CPT codes included on this report.  AMA assumes no liability for data contained or not contained herein. [employer] is a Designer, television/film set of the [employer].

## 2014-06-23 NOTE — Interval H&P Note (Signed)
History and Physical Interval Note:  06/23/2014 7:27 AM  Thomas Malone  has presented today for surgery, with the diagnosis of screening  The various methods of treatment have been discussed with the patient and family. After consideration of risks, benefits and other options for treatment, the patient has consented to  Procedure(s): COLONOSCOPY (N/A) as a surgical intervention .  The patient's history has been reviewed, patient examined, no change in status, stable for surgery.  I have reviewed the patient's chart and labs.  Questions were answered to the patient's satisfaction.     Aviva Signs A

## 2014-06-23 NOTE — Discharge Instructions (Signed)

## 2014-06-27 ENCOUNTER — Encounter (HOSPITAL_COMMUNITY): Payer: Self-pay | Admitting: General Surgery

## 2014-11-30 ENCOUNTER — Other Ambulatory Visit: Payer: Self-pay | Admitting: Thoracic Surgery (Cardiothoracic Vascular Surgery)

## 2014-11-30 DIAGNOSIS — R590 Localized enlarged lymph nodes: Secondary | ICD-10-CM

## 2014-12-18 ENCOUNTER — Other Ambulatory Visit: Payer: Self-pay | Admitting: Thoracic Surgery (Cardiothoracic Vascular Surgery)

## 2014-12-18 DIAGNOSIS — R590 Localized enlarged lymph nodes: Secondary | ICD-10-CM

## 2015-01-02 ENCOUNTER — Ambulatory Visit: Payer: 59 | Admitting: Thoracic Surgery (Cardiothoracic Vascular Surgery)

## 2015-01-02 ENCOUNTER — Other Ambulatory Visit: Payer: 59

## 2015-01-25 ENCOUNTER — Other Ambulatory Visit: Payer: Self-pay

## 2015-01-25 ENCOUNTER — Other Ambulatory Visit: Payer: Self-pay | Admitting: Thoracic Surgery (Cardiothoracic Vascular Surgery)

## 2015-01-25 DIAGNOSIS — R590 Localized enlarged lymph nodes: Secondary | ICD-10-CM

## 2015-01-25 LAB — CREATININE, SERUM: CREATININE: 1.61 mg/dL — AB (ref 0.70–1.25)

## 2015-01-30 ENCOUNTER — Ambulatory Visit (INDEPENDENT_AMBULATORY_CARE_PROVIDER_SITE_OTHER): Payer: 59 | Admitting: Thoracic Surgery (Cardiothoracic Vascular Surgery)

## 2015-01-30 ENCOUNTER — Encounter: Payer: Self-pay | Admitting: Thoracic Surgery (Cardiothoracic Vascular Surgery)

## 2015-01-30 ENCOUNTER — Ambulatory Visit
Admission: RE | Admit: 2015-01-30 | Discharge: 2015-01-30 | Disposition: A | Discharge status: Home / Self Care | Payer: 59 | Source: Ambulatory Visit | Attending: Thoracic Surgery (Cardiothoracic Vascular Surgery) | Admitting: Thoracic Surgery (Cardiothoracic Vascular Surgery)

## 2015-01-30 VITALS — BP 130/85 | HR 67 | Resp 16 | Ht 70.0 in | Wt 230.0 lb

## 2015-01-30 DIAGNOSIS — R590 Localized enlarged lymph nodes: Secondary | ICD-10-CM

## 2015-01-30 DIAGNOSIS — R599 Enlarged lymph nodes, unspecified: Secondary | ICD-10-CM

## 2015-01-30 NOTE — Progress Notes (Signed)
[address]       [place] [address]             [phone]       HPI: [name] returns for a 1 year follow-up of his bilateral hilar adenopathy.  He is a 64 year old gentleman with a history of hypertension, sarcoidosis, and a left upper lobe pneumonia in November 2015. He never smoked cigarettes but will occasionally smoke a cigar with his friends. He was never a regular smoker.  He was diagnosed with sarcoidosis about 25 years ago. He was treated with steroids for about 3 months and did not have any further issues.  He had a 3 month history of cough beginning in August 2015. He then began having fevers, chills, and night sweats. He was diagnosed with pneumonia in November 2015. He was treated with antibiotics and steroids and had rapid symptomatic improvement. A chest x-ray initially showed left upper lobe pneumonia. He was followed with x-rays and the left upper lobe pneumonia was resolving, but on his chest x-ray in early December 2015 there was a question of a right hilar mass. He had a CT on 01/23/2014 which showed mediastinal and bilateral hilar adenopathy. There was a resolving left upper lobe pneumonia but no lung mass.  He had a PET/CT in February 2016 which showed persistent hilar and mediastinal adenopathy. The nodes were hypermetabolic. The left upper lobe infiltrate was resolving but did still have some metabolic activity. There were no easily accessible nodes for mediastinoscopy. I discussed possible VATS for node biopsy, but he was not interested in pursuing that. We have been following him with CT scans since then.  I saw him in May and his scan was unchanged and he was feeling well at that time.  He has not had any issues in the interim since his last visit. He denies cough, wheezing, shortness of breath, chest pain, fever, chills, night sweats, change in appetite. He has lost about 5 pounds by changing his diet.  Past Medical History  Diagnosis  Date  . Erectile dysfunction   . H/O sarcoidosis   . Gastroesophageal reflux   . Hypertension   . Abnormal CT of the chest 01/23/14  . Pneumonia of upper lobe of lung 12/1213     Current Outpatient Prescriptions  Medication Sig Dispense Refill  . aspirin EC 81 MG tablet Take 81 mg by mouth daily.    . benazepril (LOTENSIN) 20 MG tablet Take 20 mg by mouth daily.    Marland Kitchen glucosamine-chondroitin 500-400 MG tablet Take 2 tablets by mouth daily.    . hydrochlorothiazide (HYDRODIURIL) 25 MG tablet Take 12.5 mg by mouth daily.      No current facility-administered medications for this visit.    Physical Exam BP 130/85 mmHg  Pulse 67  Resp 16  Ht 5\' 10"  (1.778 m)  Wt 230 lb (104.327 kg)  BMI 33.00 kg/m2  SpO39 3% 64 year old man in no acute distress Alert and oriented 3 with no focal deficits No cervical or supraclavicular adenopathy Cardiac regular rate and rhythm normal S1 and S2 Lungs clear with equal breath sounds bilaterally  Diagnostic Tests: CT CHEST WITHOUT CONTRAST  TECHNIQUE: Multidetector CT imaging of the chest was performed following the standard protocol without IV contrast.  COMPARISON: 06/13/2014, 01/23/2014  FINDINGS: The central airways are patent. There are 2 left upper lobe pulmonary nodules measuring 3 mm each unchanged from the prior exams. The lungs are otherwise clear. There is  no pleural effusion or pneumothorax.  There is a stable enlarged right hilar lymph node measuring 2.1 cm in short axis. There is prominent left hilar nodal soft tissue which is difficult to accurately measure secondary to lack of intravenous contrast. No mediastinal or axillary lymphadenopathy.  Bilateral retroareolar fibroglandular tissue consistent with mild gynecomastia.  The heart size is normal. There is no pericardial effusion. The thoracic aorta is normal in caliber.  Review of bone windows demonstrates no focal lytic or sclerotic lesions.  Limited  non-contrast images of the upper abdomen were obtained. The adrenal glands appear normal. The remainder of the visualized abdominal organs are unremarkable.  IMPRESSION: 1. No acute cardiopulmonary disease. 2. Mild bilateral hilar lymphadenopathy similar in appearance to the prior examinations. Evaluation is somewhat limited secondary to lack of intravenous contrast. 3. Two stable 3 mm left upper lobe pulmonary nodules. If the patient is at high risk for bronchogenic carcinoma, follow-up chest CT at 1 year is recommended. If the patient is at low risk, no follow-up is needed. This recommendation follows the consensus statement: Guidelines for Management of Small Pulmonary Nodules Detected on CT Scans: A Statement from the [place] as published in Radiology 2005; 237:395-400.   Electronically Signed  By: Kathreen Devoid  On: 01/30/2015 13:24  I personally reviewed the CT and concur with the findings noted above  Impression: [name] is a 64 year old gentleman with a history of sarcoidosis who has bilateral hilar lymphadenopathy. This has been stable over the past year. He also has 2 small left upper lobe nodules that are stable as well.  Once again discussed the differential diagnosis. I doubt this is cancer as it has been stable for a year now. By far the most likely etiology is sarcoidosis. He remains asymptomatic from a pulmonary standpoint.  I recommended that we continue to follow him until at least 2 years from her first visit. I recommended we repeat a CT scan in 6 months.  Plan: Return in 6 months with CT chest  I spent 10 minutes with [name] during this visit, greater than 50% was spent in counseling  [name], MD [facility] [phone]

## 2015-05-04 ENCOUNTER — Emergency Department (HOSPITAL_COMMUNITY)
Admission: EM | Admit: 2015-05-04 | Discharge: 2015-05-04 | Disposition: A | Discharge status: Home / Self Care | Payer: BLUE CROSS/BLUE SHIELD | Attending: Emergency Medicine | Admitting: Emergency Medicine

## 2015-05-04 ENCOUNTER — Emergency Department (HOSPITAL_COMMUNITY): Payer: BLUE CROSS/BLUE SHIELD

## 2015-05-04 ENCOUNTER — Encounter (HOSPITAL_COMMUNITY): Payer: Self-pay | Admitting: Vascular Surgery

## 2015-05-04 DIAGNOSIS — Z87891 Personal history of nicotine dependence: Secondary | ICD-10-CM | POA: Insufficient documentation

## 2015-05-04 DIAGNOSIS — R079 Chest pain, unspecified: Secondary | ICD-10-CM | POA: Diagnosis present

## 2015-05-04 DIAGNOSIS — Z87438 Personal history of other diseases of male genital organs: Secondary | ICD-10-CM | POA: Diagnosis not present

## 2015-05-04 DIAGNOSIS — Z79899 Other long term (current) drug therapy: Secondary | ICD-10-CM | POA: Diagnosis not present

## 2015-05-04 DIAGNOSIS — I10 Essential (primary) hypertension: Secondary | ICD-10-CM | POA: Insufficient documentation

## 2015-05-04 DIAGNOSIS — K219 Gastro-esophageal reflux disease without esophagitis: Secondary | ICD-10-CM

## 2015-05-04 DIAGNOSIS — Z7982 Long term (current) use of aspirin: Secondary | ICD-10-CM | POA: Diagnosis not present

## 2015-05-04 DIAGNOSIS — Z8701 Personal history of pneumonia (recurrent): Secondary | ICD-10-CM | POA: Insufficient documentation

## 2015-05-04 HISTORY — DX: Elevated prostate specific antigen (PSA): R97.20

## 2015-05-04 LAB — I-STAT TROPONIN, ED
Troponin i, poc: 0 ng/mL (ref 0.00–0.08)
Troponin i, poc: 0.01 ng/mL (ref 0.00–0.08)

## 2015-05-04 LAB — CBC
HCT: 40.7 % (ref 39.0–52.0)
Hemoglobin: 13.9 g/dL (ref 13.0–17.0)
MCH: 27.5 pg (ref 26.0–34.0)
MCHC: 34.2 g/dL (ref 30.0–36.0)
MCV: 80.6 fL (ref 78.0–100.0)
Platelets: 208 10*3/uL (ref 150–400)
RBC: 5.05 MIL/uL (ref 4.22–5.81)
RDW: 12.9 % (ref 11.5–15.5)
WBC: 9.8 10*3/uL (ref 4.0–10.5)

## 2015-05-04 LAB — BASIC METABOLIC PANEL
Anion gap: 10 (ref 5–15)
BUN: 14 mg/dL (ref 6–20)
CALCIUM: 9.1 mg/dL (ref 8.9–10.3)
CO2: 23 mmol/L (ref 22–32)
CREATININE: 1.64 mg/dL — AB (ref 0.61–1.24)
Chloride: 107 mmol/L (ref 101–111)
GFR calc Af Amer: 50 mL/min — ABNORMAL LOW (ref >60)
GFR, EST NON AFRICAN AMERICAN: 43 mL/min — AB (ref >60)
GLUCOSE: 180 mg/dL — AB (ref 65–99)
Potassium: 3.8 mmol/L (ref 3.5–5.1)
Sodium: 140 mmol/L (ref 135–145)

## 2015-05-04 MED ORDER — HYDROMORPHONE HCL 1 MG/ML IJ SOLN
1.0000 mg | Freq: Once | INTRAMUSCULAR | Status: AC
  Administered 2015-05-04: 1 mg via INTRAVENOUS
  Filled 2015-05-04: qty 1

## 2015-05-04 MED ORDER — ONDANSETRON HCL 4 MG/2ML IJ SOLN
4.0000 mg | Freq: Once | INTRAMUSCULAR | Status: AC
  Administered 2015-05-04: 4 mg via INTRAVENOUS
  Filled 2015-05-04: qty 2

## 2015-05-04 MED ORDER — OMEPRAZOLE 20 MG PO CPDR: 20.0000 mg | DELAYED_RELEASE_CAPSULE | Freq: Every day | ORAL | Status: DC

## 2015-05-04 NOTE — ED Provider Notes (Signed)
CSN: [id number]     Arrival date & time 05/04/15  1139 History   First MD Initiated Contact with Patient 05/04/15 1157     Chief Complaint  Patient presents with  . Chest Pain      HPI Pt here via EMS from urology office- Pt was having a prostate biopsy. When he was laid flat, he began experiencing CP, SOB, and nausea that subsided when he sat up. Pt also had syncopal episode in the ambulance, EMS suspects vagal because his heart rate also dropped, Past Medical History  Diagnosis Date  . Erectile dysfunction   . H/O sarcoidosis   . Gastroesophageal reflux   . Hypertension   . Abnormal CT of the chest 01/23/14  . Pneumonia of upper lobe of lung 12/1213  . Elevated PSA    Past Surgical History  Procedure Laterality Date  . Orthopedic surgery      heel surgery  . Tonsillectomy    . Colonoscopy    . Colonoscopy N/A 06/23/2014    Procedure: COLONOSCOPY;  Surgeon: Aviva Signs Md, MD;  Location: [facility];  Service: Gastroenterology;  Laterality: N/A;  . Prostate biopsy     Family History  Problem Relation Age of Onset  . Cancer Mother   . Heart disease Father   . Hyperlipidemia Sister   . Diabetes Sister   . Heart disease Sister   . Hyperlipidemia Brother   . Diabetes Brother    Social History  Substance Use Topics  . Smoking status: Former Smoker    Types: Cigars  . Smokeless tobacco: None  . Alcohol Use: No    Review of Systems  All other systems reviewed and are negative.     Allergies  Voltaren  Home Medications   Prior to Admission medications   Medication Sig Start Date End Date Taking? Authorizing Provider  aspirin EC 81 MG tablet Take 81 mg by mouth daily.    Historical Provider, MD  benazepril (LOTENSIN) 20 MG tablet Take 20 mg by mouth daily.    Historical Provider, MD  glucosamine-chondroitin 500-400 MG tablet Take 2 tablets by mouth daily.    Historical Provider, MD  hydrochlorothiazide (HYDRODIURIL) 25 MG tablet Take 12.5 mg by mouth daily.      Historical Provider, MD  omeprazole (PRILOSEC) 20 MG capsule Take 1 capsule (20 mg total) by mouth daily. 05/04/15   Leonard Schwartz, MD   BP 116/73 mmHg  Pulse 67  Temp(Src) 97.8 F (36.6 C) (Oral)  Resp 17  SpO2 97% Physical Exam Physical Exam  Nursing note and vitals reviewed. Constitutional: He is oriented to person, place, and time. He appears well-developed and well-nourished. No distress.  HENT:  Head: Normocephalic and atraumatic.  Eyes: Pupils are equal, round, and reactive to light.  Neck: Normal range of motion.  Cardiovascular: Normal rate and intact distal pulses.   Pulmonary/Chest: No respiratory distress.  Abdominal: Normal appearance. He exhibits no distension.  Musculoskeletal: Normal range of motion.  Neurological: He is alert and oriented to person, place, and time. No cranial nerve deficit.  Skin: Skin is warm and dry. No rash noted.  Psychiatric: He has a normal mood and affect. His behavior is normal.   ED Course  Procedures (including critical care time) Labs Review Labs Reviewed  BASIC METABOLIC PANEL - Abnormal; Notable for the following:    Glucose, Bld 180 (*)    Creatinine, Ser 1.64 (*)    GFR calc non Af Amer 43 (*)  GFR calc Af Amer 50 (*)    All other components within normal limits  CBC  I-STAT TROPOININ, ED  I-STAT TROPOININ, ED    Imaging Review Dg Chest 2 View  05/04/2015  CLINICAL DATA:  Chest pain EXAM: CHEST  2 VIEW COMPARISON:  Chest CT 01/30/2015 FINDINGS: Heart and mediastinal contours are within normal limits. No focal opacities or effusions. No acute bony abnormality. Degenerative changes in the thoracic spine and shoulders. IMPRESSION: No active cardiopulmonary disease. Electronically Signed   By: Rolm Baptise M.D.   On: 05/04/2015 12:42   I have personally reviewed and evaluated these images and lab results as part of my medical decision-making.   EKG Interpretation   Date/Time:  Friday May 04 2015 11:45:36  EDT Ventricular Rate:  60 PR Interval:  176 QRS Duration: 100 QT Interval:  412 QTC Calculation: 412 R Axis:   10 Text Interpretation:  Sinus rhythm No significant change since last  tracing Confirmed by Anais Koenen  MD, Raenell Mensing ([id number]) on 05/04/2015 12:22:14 PM      MDM   Final diagnoses:  Gastroesophageal reflux disease without esophagitis        Leonard Schwartz, MD 05/04/15 1547

## 2015-05-04 NOTE — ED Notes (Signed)
Pt here via EMS from urology office- Pt was having a prostate biopsy. When he was laid flat, he began experiencing CP, SOB, and nausea that subsided when he sat up. Pt also had syncopal episode in the ambulance, EMS suspects vagal because his heart rate also dropped, Pt receivec 324 ASA and 4mg  zofran.

## 2015-05-04 NOTE — Discharge Instructions (Signed)
Gastroesophageal Reflux Disease, Adult Normally, food travels down the esophagus and stays in the stomach to be digested. If a person has gastroesophageal reflux disease (GERD), food and stomach acid move back up into the esophagus. When this happens, the esophagus becomes sore and swollen (inflamed). Over time, GERD can make small holes (ulcers) in the lining of the esophagus. HOME CARE Diet  Follow a diet as told by your doctor. You may need to avoid foods and drinks such as:  Coffee and tea (with or without caffeine).  Drinks that contain alcohol.  Energy drinks and sports drinks.  Carbonated drinks or sodas.  Chocolate and cocoa.  Peppermint and mint flavorings.  Garlic and onions.  Horseradish.  Spicy and acidic foods, such as peppers, chili powder, curry powder, vinegar, hot sauces, and BBQ sauce.  Citrus fruit juices and citrus fruits, such as oranges, lemons, and limes.  Tomato-based foods, such as red sauce, chili, salsa, and pizza with red sauce.  Fried and fatty foods, such as donuts, french fries, potato chips, and high-fat dressings.  High-fat meats, such as hot dogs, rib eye steak, sausage, ham, and bacon.  High-fat dairy items, such as whole milk, butter, and cream cheese.  Eat small meals often. Avoid eating large meals.  Avoid drinking large amounts of liquid with your meals.  Avoid eating meals during the 2-3 hours before bedtime.  Avoid lying down right after you eat.  Do not exercise right after you eat. General Instructions  Pay attention to any changes in your symptoms.  Take over-the-counter and prescription medicines only as told by your doctor. Do not take aspirin, ibuprofen, or other NSAIDs unless your doctor says it is okay.  Do not use any tobacco products, including cigarettes, chewing tobacco, and e-cigarettes. If you need help quitting, ask your doctor.  Wear loose clothes. Do not wear anything tight around your waist.  Raise  (elevate) the head of your bed about 6 inches (15 cm).  Try to lower your stress. If you need help doing this, ask your doctor.  If you are overweight, lose an amount of weight that is healthy for you. Ask your doctor about a safe weight loss goal.  Keep all follow-up visits as told by your doctor. This is important. GET HELP IF:  You have new symptoms.  You lose weight and you do not know why it is happening.  You have trouble swallowing, or it hurts to swallow.  You have wheezing or a cough that keeps happening.  Your symptoms do not get better with treatment.  You have a hoarse voice. GET HELP RIGHT AWAY IF:  You have pain in your arms, neck, jaw, teeth, or back.  You feel sweaty, dizzy, or light-headed.  You have chest pain or shortness of breath.  You throw up (vomit) and your throw up looks like blood or coffee grounds.  You pass out (faint).  Your poop (stool) is bloody or black.  You cannot swallow, drink, or eat.   This information is not intended to replace advice given to you by your health care provider. Make sure you discuss any questions you have with your health care provider.   Document Released: 07/09/2007 Document Revised: 10/11/2014 Document Reviewed: 05/17/2014 Elsevier Interactive Patient Education 2016 [place] for Gastroesophageal Reflux Disease, Adult When you have gastroesophageal reflux disease (GERD), the foods you eat and your eating habits are very important. Choosing the right foods can help ease your discomfort.  WHAT GUIDELINES  DO I NEED TO FOLLOW?   Choose fruits, vegetables, whole grains, and low-fat dairy products.   Choose low-fat meat, fish, and poultry.  Limit fats such as oils, salad dressings, butter, nuts, and avocado.   Keep a food diary. This helps you identify foods that cause symptoms.   Avoid foods that cause symptoms. These may be different for everyone.   Eat small meals often instead of 3  large meals a day.   Eat your meals slowly, in a place where you are relaxed.   Limit fried foods.   Cook foods using methods other than frying.   Avoid drinking alcohol.   Avoid drinking large amounts of liquids with your meals.   Avoid bending over or lying down until 2-3 hours after eating.  WHAT FOODS ARE NOT RECOMMENDED?  These are some foods and drinks that may make your symptoms worse: Vegetables Tomatoes. Tomato juice. Tomato and spaghetti sauce. Chili peppers. Onion and garlic. Horseradish. Fruits Oranges, grapefruit, and lemon (fruit and juice). Meats High-fat meats, fish, and poultry. This includes hot dogs, ribs, ham, sausage, salami, and bacon. Dairy Whole milk and chocolate milk. Sour cream. Cream. Butter. Ice cream. Cream cheese.  Drinks Coffee and tea. Bubbly (carbonated) drinks or energy drinks. Condiments Hot sauce. Barbecue sauce.  Sweets/Desserts Chocolate and cocoa. Donuts. Peppermint and spearmint. Fats and Oils High-fat foods. This includes [place] fries and potato chips. Other Vinegar. Strong spices. This includes black pepper, white pepper, red pepper, cayenne, curry powder, cloves, ginger, and chili powder. The items listed above may not be a complete list of foods and drinks to avoid. Contact your dietitian for more information.   This information is not intended to replace advice given to you by your health care provider. Make sure you discuss any questions you have with your health care provider.   Document Released: 07/22/2011 Document Revised: 02/10/2014 Document Reviewed: 11/24/2012 Elsevier Interactive Patient Education [employer].

## 2015-07-05 ENCOUNTER — Other Ambulatory Visit: Payer: Self-pay | Admitting: Thoracic Surgery (Cardiothoracic Vascular Surgery)

## 2015-07-05 DIAGNOSIS — R591 Generalized enlarged lymph nodes: Secondary | ICD-10-CM

## 2015-07-05 DIAGNOSIS — R599 Enlarged lymph nodes, unspecified: Secondary | ICD-10-CM

## 2015-07-23 ENCOUNTER — Ambulatory Visit
Admission: RE | Admit: 2015-07-23 | Discharge: 2015-07-23 | Disposition: A | Discharge status: Home / Self Care | Payer: BLUE CROSS/BLUE SHIELD | Source: Ambulatory Visit | Attending: Thoracic Surgery (Cardiothoracic Vascular Surgery) | Admitting: Thoracic Surgery (Cardiothoracic Vascular Surgery)

## 2015-07-23 ENCOUNTER — Encounter: Payer: Self-pay | Admitting: Thoracic Surgery (Cardiothoracic Vascular Surgery)

## 2015-07-23 ENCOUNTER — Ambulatory Visit (INDEPENDENT_AMBULATORY_CARE_PROVIDER_SITE_OTHER): Payer: BLUE CROSS/BLUE SHIELD | Admitting: Thoracic Surgery (Cardiothoracic Vascular Surgery)

## 2015-07-23 VITALS — BP 139/92 | HR 64 | Resp 20 | Ht 70.0 in | Wt 246.0 lb

## 2015-07-23 DIAGNOSIS — R599 Enlarged lymph nodes, unspecified: Secondary | ICD-10-CM

## 2015-07-23 DIAGNOSIS — R590 Localized enlarged lymph nodes: Secondary | ICD-10-CM

## 2015-07-23 DIAGNOSIS — R591 Generalized enlarged lymph nodes: Secondary | ICD-10-CM

## 2015-07-23 NOTE — Progress Notes (Signed)
[address]       [place] [address]             [phone]       HPI: Thomas Malone returns today for a scheduled 6 month follow up visit.  He is a 65 year old gentleman with a history of hypertension, sarcoidosis, and a left upper lobe pneumonia in November 2015. He never smoked cigarettes but will occasionally smoke a cigar with his friends. He was never a regular smoker.  He was diagnosed with sarcoidosis about 25 years ago. He was treated with steroids for about 3 months and did not have any further issues.  He presented with cough, fevers, chills, and night sweats and was diagnosed with pneumonia in November 2015. He was treated with antibiotics and steroids and had rapid symptomatic improvement. A chest x-ray initially showed left upper lobe pneumonia. He was followed with x-rays and the left upper lobe pneumonia was resolving, but on his chest x-ray in early December 2015 there was a question of a right hilar mass. He had a CT on 01/23/2014 which showed mediastinal and bilateral hilar adenopathy. There was a resolving left upper lobe pneumonia but no lung mass.  He had a PET/CT in February 2016 which showed persistent hilar and mediastinal adenopathy. The nodes were hypermetabolic. The left upper lobe infiltrate was resolving but did still have some metabolic activity. There were no easily accessible nodes for mediastinoscopy. I discussed possible VATS for node biopsy, but he was not interested in pursuing that. We have been following him with CT scans since then.  Most recently I saw him in the office in December 2016 for a 1 year follow-up.   He has been feeling well. He has no regular cough. He denies hemoptysis. He denies fevers, chills, sweats, wheezing, and weight loss. He does get short of breath with heavy exertion, but that has not changed over time.  Past Medical History  Diagnosis Date  . Erectile dysfunction   . H/O sarcoidosis   . Gastroesophageal  reflux   . Hypertension   . Abnormal CT of the chest 01/23/14  . Pneumonia of upper lobe of lung 12/1213  . Elevated PSA       Current Outpatient Prescriptions  Medication Sig Dispense Refill  . aspirin EC 81 MG tablet Take 81 mg by mouth daily.    . benazepril (LOTENSIN) 20 MG tablet Take 20 mg by mouth daily.    Marland Kitchen glucosamine-chondroitin 500-400 MG tablet Take 2 tablets by mouth daily.    . hydrochlorothiazide (HYDRODIURIL) 25 MG tablet Take 12.5 mg by mouth daily.     Marland Kitchen omeprazole (PRILOSEC) 20 MG capsule Take 1 capsule (20 mg total) by mouth daily. 30 capsule 0   No current facility-administered medications for this visit.    Physical Exam BP 139/92 mmHg  Pulse 64  Resp 20  Ht 5\' 10"  (1.778 m)  Wt 246 lb (111.585 kg)  BMI 35.30 kg/m2  SpO66 43% 65 year old man in no acute distress Alert and oriented 3 with no focal deficits No cervical or supraclavicular adenopathy Lungs clear with equal breath sounds bilaterally, no rales or wheezing Cardiac regular rate and rhythm normal S1 and S2 no rubs or gallops  Diagnostic Tests: CT CHEST WITHOUT CONTRAST  TECHNIQUE: Multidetector CT imaging of the chest was performed following the standard protocol without IV contrast.  COMPARISON: Chest x-ray dated 05/04/2015 and chest CT scans dated 01/30/2015 and 01/23/2014  FINDINGS: Mediastinum/Lymph  Nodes: The slightly prominent mediastinal and right hilar lymph node mass are unchanged since 2015. No new adenopathy. Heart size is normal. Moderate calcification in the proximal left anterior descending and circumflex coronary arteries, progressed since 2015.  Lungs/Pleura: The tiny 3 mm nodules in the left upper lobe was well is a tiny area of pleural scarring and a 3 mm nodule peripherally in the right upper lobe on image 31 of series 4,all unchanged since 2015. Slight scarring at the left upper lobe is stable. No new pulmonary nodules.  Upper abdomen: No acute findings.  Slight enlargement of right upper pole renal cyst measuring 4.7 cm.  Musculoskeletal: No significant abnormality.  IMPRESSION: No significant abnormalities. Stable hilar and mediastinal adenopathy since 2015. Stable small pulmonary nodules. No further followup is recommended.   Electronically Signed  By: [name] M.D.  On: 07/23/2015 15:16       I personally reviewed the CT and compared with previous. No interval change  Impression: 65 year old man with a past history of sarcoidosis who was found to have bilateral hilar adenopathy back in December 2015. The nodes were hypermetabolic on PET. He refused biopsy at that time and we have followed him radiographically since then. Those nodes remain stable at 6 months one year and now 18 months.  He has not had any new respiratory symptoms in the interim since his last visit.  His blood pressure is on the high side today. He is on medication for that. He will follow-up with his primary.  Plan:  Return in 6 months with CT chest for his 2 year follow-up visit.  [name], MD   [facility] [phone]

## 2015-12-21 ENCOUNTER — Other Ambulatory Visit: Payer: Self-pay | Admitting: Thoracic Surgery (Cardiothoracic Vascular Surgery)

## 2015-12-21 DIAGNOSIS — R9389 Abnormal findings on diagnostic imaging of other specified body structures: Secondary | ICD-10-CM

## 2016-01-24 ENCOUNTER — Other Ambulatory Visit: Payer: Self-pay | Admitting: Thoracic Surgery (Cardiothoracic Vascular Surgery)

## 2016-01-29 ENCOUNTER — Encounter: Payer: Self-pay | Admitting: Thoracic Surgery (Cardiothoracic Vascular Surgery)

## 2016-01-29 ENCOUNTER — Ambulatory Visit (INDEPENDENT_AMBULATORY_CARE_PROVIDER_SITE_OTHER): Payer: BLUE CROSS/BLUE SHIELD | Admitting: Thoracic Surgery (Cardiothoracic Vascular Surgery)

## 2016-01-29 ENCOUNTER — Ambulatory Visit
Admission: RE | Admit: 2016-01-29 | Discharge: 2016-01-29 | Disposition: A | Discharge status: Home / Self Care | Payer: BLUE CROSS/BLUE SHIELD | Source: Ambulatory Visit | Attending: Thoracic Surgery (Cardiothoracic Vascular Surgery) | Admitting: Thoracic Surgery (Cardiothoracic Vascular Surgery)

## 2016-01-29 VITALS — BP 123/87 | HR 67 | Resp 20 | Ht 70.0 in | Wt 246.0 lb

## 2016-01-29 DIAGNOSIS — R9389 Abnormal findings on diagnostic imaging of other specified body structures: Secondary | ICD-10-CM

## 2016-01-29 DIAGNOSIS — R590 Localized enlarged lymph nodes: Secondary | ICD-10-CM | POA: Diagnosis not present

## 2016-01-29 NOTE — Progress Notes (Signed)
Thomas Malone is a 65 y.o. male patient with a past medical history of hypertension, sarcoidosis, and left upper lobe pneumonia in 2015.   1. Hilar adenopathy   2. Mediastinal lymphadenopathy    Past Medical History:  Diagnosis Date  . Abnormal CT of the chest 01/23/14  . Elevated PSA   . Erectile dysfunction   . Gastroesophageal reflux   . H/O sarcoidosis   . Hypertension   . Pneumonia of upper lobe of lung ([name]) 12/1213     Allergies  Allergen Reactions  . Levaquin [Levofloxacin In D5w] Anaphylaxis  . Lidocaine Anaphylaxis  . Voltaren [Diclofenac Sodium]     Topical gel    Blood pressure 123/87, pulse 67, resp. rate 20, height 5\' 10"  (1.778 m), weight 246 lb (111.6 kg), SpO2 97 %.  Subjective   Objective  Gen: Cor: Pulm: Abd: Ext:  CT scan of the chest w/o contrast  CLINICAL DATA:  Follow up pulmonary nodules and lymphadenopathy. History of sarcoidosis.  EXAM: CT CHEST WITHOUT CONTRAST  TECHNIQUE: Multidetector CT imaging of the chest was performed following the standard protocol without IV contrast.  COMPARISON:  CT 01/30/2015 and 07/23/2015.  FINDINGS: Cardiovascular: Left anterior descending coronary artery calcifications. No acute vascular findings are seen on noncontrast imaging. The heart size is normal. There is no pericardial effusion.  Mediastinum/Nodes: Asymmetric prominence the right hilum and small mediastinal lymph nodes appear unchanged. Hilar assessment is limited by the lack of intravenous contrast, although the hilar contours appear unchanged.There is no axillary lymphadenopathy. The thyroid gland, trachea and esophagus demonstrate no significant findings.  Lungs/Pleura: There is no pleural effusion. Stable linear scarring in the left upper lobe. There are scattered small pulmonary nodules which are unchanged, including 3 mm left upper lobe nodules (images 19, 42 and 47) and a 3 mm right middle lobe nodule (image 72). No new  or enlarging nodules identified.  Upper abdomen: The visualized upper abdomen appears stable. There is a cyst in the upper pole the right kidney and tiny nonobstructing right renal calculi.  Musculoskeletal/Chest wall: There is no chest wall mass or suspicious osseous finding.  IMPRESSION: 1. Stable chest CT demonstrating no acute or worrisome findings. 2. Asymmetric prominence of the right hilum and small mediastinal lymph nodes are stable. 3. Stable small pulmonary nodules bilaterally. 4. Coronary artery atherosclerosis.   Electronically Signed   By: [name] M.D.   On: 01/29/2016 14:41   Assessment & Plan  Dr. Roxan Hockey saw this patient. Please refer to his note.   Thomas Malone 01/29/2016

## 2016-01-29 NOTE — Progress Notes (Signed)
[address]       [place] [address]             [phone]    HPI: Thomas Malone returns for his 2 year follow-up visit  Mr. Mahood is a 65 year old gentleman with a history of sarcoidosis. He was diagnosed with sarcoidosis about 25 years ago. He was treated with steroids for about 3 months and did not have any further issues.  He presented with cough, fevers, chills, and night sweats and was diagnosed with pneumonia in November 2015. He was treated with antibiotics and steroids and had rapid symptomatic improvement. A chest x-ray initially showed left upper lobe pneumonia. He was followed with x-rays and the left upper lobe pneumonia was resolving, but on his chest x-ray in early December 2015 there was a question of a right hilar mass. He had a CT on 01/23/2014 which showed mediastinal and bilateral hilar adenopathy. There was a resolving left upper lobe pneumonia but no lung mass.  He had a PET/CT in February 2016 which showed persistent hilar and mediastinal adenopathy. The nodes were hypermetabolic. The left upper lobe infiltrate was resolving but did still have some metabolic activity. There were no easily accessible nodes for mediastinoscopy. I discussed possible VATS for node biopsy, but he was not interested in pursuing that. We have been following him with CT scans since then.  I saw him again in December 2016 and the CT findings were stable. His most recent office visit was in June 2017. Again the CT scan showed no change.  He feels well currently. He says about a month ago he was feeling very poorly. He felt very fatigued and weak. He had a spider bite on his face that became infected. He was diagnosed with diabetes. He was started on metformin 500 mg daily. Since then his sugars have been markedly better and he feels much better as well. He denies any cough or shortness of breath. He also denies wheezing. He has lost about 40 pounds.  Past Medical History:    Diagnosis Date  . Abnormal CT of the chest 01/23/14  . Elevated PSA   . Erectile dysfunction   . Gastroesophageal reflux   . H/O sarcoidosis   . Hypertension   . Pneumonia of upper lobe of lung ([facility]) 12/1213    Current Outpatient Prescriptions  Medication Sig Dispense Refill  . aspirin EC 81 MG tablet Take 81 mg by mouth daily.    . benazepril (LOTENSIN) 20 MG tablet Take 20 mg by mouth daily.    Marland Kitchen glucosamine-chondroitin 500-400 MG tablet Take 2 tablets by mouth daily.    . hydrochlorothiazide (HYDRODIURIL) 25 MG tablet Take 12.5 mg by mouth daily.     . metFORMIN (GLUCOPHAGE-XR) 500 MG 24 hr tablet Take 500 mg by mouth daily with breakfast.   1  . omeprazole (PRILOSEC) 20 MG capsule Take 1 capsule (20 mg total) by mouth daily. 30 capsule 0   No current facility-administered medications for this visit.     Physical Exam BP 123/87   Pulse 67   Resp 20   Ht 5\' 10"  (1.778 m)   Wt 246 lb (111.6 kg)   SpO2 97% Comment: RA  BMI 35.104 kg/m  65 year old man in no acute distress Alert and oriented 3 with no focal deficits No cervical or supraclavicular adenopathy Cardiac regular rate and rhythm normal S1 and S2 Lungs clear with equal breath sounds bilaterally  Diagnostic Tests: CT  CHEST WITHOUT CONTRAST  TECHNIQUE: Multidetector CT imaging of the chest was performed following the standard protocol without IV contrast.  COMPARISON:  CT 01/30/2015 and 07/23/2015.  FINDINGS: Cardiovascular: Left anterior descending coronary artery calcifications. No acute vascular findings are seen on noncontrast imaging. The heart size is normal. There is no pericardial effusion.  Mediastinum/Nodes: Asymmetric prominence the right hilum and small mediastinal lymph nodes appear unchanged. Hilar assessment is limited by the lack of intravenous contrast, although the hilar contours appear unchanged.There is no axillary lymphadenopathy. The thyroid gland, trachea and esophagus  demonstrate no significant findings.  Lungs/Pleura: There is no pleural effusion. Stable linear scarring in the left upper lobe. There are scattered small pulmonary nodules which are unchanged, including 3 mm left upper lobe nodules (images 19, 42 and 47) and a 3 mm right middle lobe nodule (image 72). No new or enlarging nodules identified.  Upper abdomen: The visualized upper abdomen appears stable. There is a cyst in the upper pole the right kidney and tiny nonobstructing right renal calculi.  Musculoskeletal/Chest wall: There is no chest wall mass or suspicious osseous finding.  IMPRESSION: 1. Stable chest CT demonstrating no acute or worrisome findings. 2. Asymmetric prominence of the right hilum and small mediastinal lymph nodes are stable. 3. Stable small pulmonary nodules bilaterally. 4. Coronary artery atherosclerosis.   Electronically Signed   By: [name] M.D.   On: 01/29/2016 14:41 I personally reviewed the CT chest and concur with the findings noted above.  Impression: 65 year old man with a history of sarcoidosis who has bilateral hilar adenopathy right greater than left. This was first noted 2 years ago on a CT scan after he was being treated for pneumonia. His CT today is unchanged and there is been no significant change over a two-year period, so I think a malignancy can be safely ruled out. This almost certainly is a manifestation of his sarcoidosis. He currently is asymptomatic from a respiratory standpoint, so there is no indication for steroids. This particular gentleman light of his recent diagnosis of diabetes. Just be completely safe to have him return in 1 year with a repeat CT of the chest. He knows to call if he develops chest pain or shortness of breath.  Plan: Return in 1 year with CT chest  [name], MD [facility] [phone]

## 2016-08-08 DIAGNOSIS — S61411A Laceration without foreign body of right hand, initial encounter: Secondary | ICD-10-CM | POA: Diagnosis not present

## 2016-08-08 DIAGNOSIS — E119 Type 2 diabetes mellitus without complications: Secondary | ICD-10-CM | POA: Diagnosis not present

## 2016-09-19 DIAGNOSIS — E1165 Type 2 diabetes mellitus with hyperglycemia: Secondary | ICD-10-CM | POA: Diagnosis not present

## 2016-09-19 DIAGNOSIS — R972 Elevated prostate specific antigen [PSA]: Secondary | ICD-10-CM | POA: Diagnosis not present

## 2016-09-19 DIAGNOSIS — N183 Chronic kidney disease, stage 3 (moderate): Secondary | ICD-10-CM | POA: Diagnosis not present

## 2016-09-19 DIAGNOSIS — I10 Essential (primary) hypertension: Secondary | ICD-10-CM | POA: Diagnosis not present

## 2016-11-15 ENCOUNTER — Emergency Department (HOSPITAL_COMMUNITY): Payer: Worker's Compensation

## 2016-11-15 ENCOUNTER — Encounter (HOSPITAL_COMMUNITY): Payer: Self-pay | Admitting: Emergency Medicine

## 2016-11-15 ENCOUNTER — Emergency Department (HOSPITAL_COMMUNITY)
Admission: EM | Admit: 2016-11-15 | Discharge: 2016-11-15 | Disposition: A | Discharge status: Home / Self Care | Payer: Worker's Compensation | Attending: Emergency Medicine | Admitting: Emergency Medicine

## 2016-11-15 DIAGNOSIS — W208XXA Other cause of strike by thrown, projected or falling object, initial encounter: Secondary | ICD-10-CM | POA: Insufficient documentation

## 2016-11-15 DIAGNOSIS — Z7984 Long term (current) use of oral hypoglycemic drugs: Secondary | ICD-10-CM | POA: Insufficient documentation

## 2016-11-15 DIAGNOSIS — Y9289 Other specified places as the place of occurrence of the external cause: Secondary | ICD-10-CM | POA: Insufficient documentation

## 2016-11-15 DIAGNOSIS — I10 Essential (primary) hypertension: Secondary | ICD-10-CM | POA: Diagnosis not present

## 2016-11-15 DIAGNOSIS — S20221A Contusion of right back wall of thorax, initial encounter: Secondary | ICD-10-CM | POA: Insufficient documentation

## 2016-11-15 DIAGNOSIS — R202 Paresthesia of skin: Secondary | ICD-10-CM | POA: Diagnosis not present

## 2016-11-15 DIAGNOSIS — E119 Type 2 diabetes mellitus without complications: Secondary | ICD-10-CM | POA: Diagnosis not present

## 2016-11-15 DIAGNOSIS — Y990 Civilian activity done for income or pay: Secondary | ICD-10-CM | POA: Diagnosis not present

## 2016-11-15 DIAGNOSIS — Y9389 Activity, other specified: Secondary | ICD-10-CM | POA: Diagnosis not present

## 2016-11-15 DIAGNOSIS — Z87891 Personal history of nicotine dependence: Secondary | ICD-10-CM | POA: Insufficient documentation

## 2016-11-15 DIAGNOSIS — S3992XA Unspecified injury of lower back, initial encounter: Secondary | ICD-10-CM | POA: Diagnosis present

## 2016-11-15 DIAGNOSIS — Z7982 Long term (current) use of aspirin: Secondary | ICD-10-CM | POA: Diagnosis not present

## 2016-11-15 DIAGNOSIS — Z79899 Other long term (current) drug therapy: Secondary | ICD-10-CM | POA: Insufficient documentation

## 2016-11-15 HISTORY — DX: Type 2 diabetes mellitus without complications: E11.9

## 2016-11-15 LAB — URINALYSIS, ROUTINE W REFLEX MICROSCOPIC
Bilirubin Urine: NEGATIVE
Glucose, UA: NEGATIVE mg/dL
Hgb urine dipstick: NEGATIVE
Ketones, ur: NEGATIVE mg/dL
Leukocytes, UA: NEGATIVE
Nitrite: NEGATIVE
PROTEIN: NEGATIVE mg/dL
Specific Gravity, Urine: 1.009 (ref 1.005–1.030)
pH: 7 (ref 5.0–8.0)

## 2016-11-15 MED ORDER — PREDNISONE 20 MG PO TABS: 40.0000 mg | ORAL_TABLET | Freq: Every day | ORAL | 0 refills | Status: DC

## 2016-11-15 NOTE — ED Provider Notes (Signed)
[facility] DEPT Provider Note   CSN: [id number] Arrival date & time: 11/15/16  0524     History   Chief Complaint Chief Complaint  Patient presents with  . Back Pain    HPI Thomas Malone is a 66 y.o. male.  HPI  66 year old male with hypertension diabetes and sarcoidosis, presents approximately 4 days after a 1 pound wing nut fell from approximately 25 feet landing on his right mid back. He states that it knocked the breath out of him, he stumbled out of the area where he was working and collapsed to the ground, it took them approximately 25 minutes to get up and get moving after that point. He denies head injury and initially did not have any arm pain. Over the last several days he has developed a gradual onset of numbness in his right arm, he states that it is the entire right arm but more so around the small finger and less on the thumb. He has no weakness of the arm at all and has no other neurologic symptoms in his face arms or legs. His gait has been normal, he has no shortness of breath but states that when he tries to take a deep breath he has some discomfort in his back. He had initially gone to urgent care, no abnormal findings were found and he was sent home. He wanted to be rechecked because of his ongoing arm symptoms.  Past Medical History:  Diagnosis Date  . Abnormal CT of the chest 01/23/14  . Diabetes mellitus without complication ([place])   . Elevated PSA   . Erectile dysfunction   . Gastroesophageal reflux   . H/O sarcoidosis   . Hypertension   . Pneumonia of upper lobe of lung ([place]) 12/1213    Patient Active Problem List   Diagnosis Date Noted  . Hilar adenopathy 06/13/2014  . H/O sarcoidosis   . Gastroesophageal reflux   . Hypertension   . Erectile dysfunction   . Pneumonia of upper lobe of lung ([place])   . Abnormal CT of the chest 01/23/2014    Past Surgical History:  Procedure Laterality Date  . COLONOSCOPY    . COLONOSCOPY N/A 06/23/2014   Procedure: COLONOSCOPY;  Surgeon: Aviva Signs Md, MD;  Location: [facility];  Service: Gastroenterology;  Laterality: N/A;  . ORTHOPEDIC SURGERY     heel surgery  . PROSTATE BIOPSY    . TONSILLECTOMY         Home Medications    Prior to Admission medications   Medication Sig Start Date End Date Taking? Authorizing Provider  aspirin EC 81 MG tablet Take 81 mg by mouth daily.    [provider]  benazepril (LOTENSIN) 20 MG tablet Take 20 mg by mouth daily.    [provider]  glucosamine-chondroitin 500-400 MG tablet Take 2 tablets by mouth daily.    [provider]  hydrochlorothiazide (HYDRODIURIL) 25 MG tablet Take 12.5 mg by mouth daily.     [provider]  metFORMIN (GLUCOPHAGE-XR) 500 MG 24 hr tablet Take 500 mg by mouth daily with breakfast.  01/26/16   [provider]  omeprazole (PRILOSEC) 20 MG capsule Take 1 capsule (20 mg total) by mouth daily. 05/04/15   Leonard Schwartz, MD  predniSONE (DELTASONE) 20 MG tablet Take 2 tablets (40 mg total) by mouth daily. 11/15/16   Noemi Chapel, MD    Family History Family History  Problem Relation Age of Onset  . Cancer Mother   .  Heart disease Father   . Hyperlipidemia Sister   . Diabetes Sister   . Heart disease Sister   . Hyperlipidemia Brother   . Diabetes Brother     Social History Social History  Substance Use Topics  . Smoking status: Former Smoker    Types: Cigars  . Smokeless tobacco: Never Used  . Alcohol use No     Allergies   Levaquin [levofloxacin in d5w]; Lidocaine; and Voltaren [diclofenac sodium]   Review of Systems Review of Systems  All other systems reviewed and are negative.    Physical Exam Updated Vital Signs BP (!) 162/101 Comment: taken twice for validation  Pulse 63   Temp 97.7 F (36.5 C) (Oral)   Resp 16   Ht 5\' 10"  (1.778 m)   Wt 104.3 kg (230 lb)   SpO2 97%   BMI 33.00 kg/m   Physical Exam  Constitutional: He appears  well-developed and well-nourished. No distress.  HENT:  Head: Normocephalic and atraumatic.  Mouth/Throat: Oropharynx is clear and moist. No oropharyngeal exudate.  Eyes: Pupils are equal, round, and reactive to light. Conjunctivae and EOM are normal. Right eye exhibits no discharge. Left eye exhibits no discharge. No scleral icterus.  Neck: Normal range of motion. Neck supple. No JVD present. No thyromegaly present.  Slightly restricted range of motion of the neck which the patient states is chronic  Cardiovascular: Normal rate, regular rhythm, normal heart sounds and intact distal pulses.  Exam reveals no gallop and no friction rub.   No murmur heard. Pulmonary/Chest: Effort normal and breath sounds normal. No respiratory distress. He has no wheezes. He has no rales.  Abdominal: Soft. Bowel sounds are normal. He exhibits no distension and no mass. There is no tenderness.  Musculoskeletal: Normal range of motion. He exhibits tenderness ( Subtle fading bruise to the right mid back, no other areas of tenderness, no subcutaneous emphysema or crepitance, no tenderness over the spine). He exhibits no edema.  No tenderness over the cervical thoracic or lumbar spines  Lymphadenopathy:    He has no cervical adenopathy.  Neurological: He is alert. Coordination normal.  Normal strength in all 4 extremities at the major muscle groups, normal grips, his sensation is slightly decreased to the right upper extremity over the fourth and fifth fingers, he denies any change in sensation over the thumb second and third fingers, the sensory deficit extends slightly up the forearm on the ulnar surface, there is no decreased sensation above the elbow or over the clavicles. He has normal strength in his lower extremities, cranial nerves III through XII are normal, coordination is normal. Radial median and ulnar nerves abnormal function with strength of the right upper extremity  Skin: Skin is warm and dry. No rash noted.  No erythema.  Psychiatric: He has a normal mood and affect. His behavior is normal.  Nursing note and vitals reviewed.    ED Treatments / Results  Labs (all labs ordered are listed, but only abnormal results are displayed) Labs Reviewed  URINALYSIS, ROUTINE W REFLEX MICROSCOPIC - Abnormal; Notable for the following:       Result Value   Color, Urine STRAW (*)    All other components within normal limits     Radiology Dg Chest 2 View  Result Date: 11/15/2016 CLINICAL DATA:  Back pain. EXAM: CHEST  2 VIEW COMPARISON:  Chest x-ray dated May 04, 2015. FINDINGS: Stable cardiomediastinal silhouette. Normal pulmonary vascularity. Slight prominence of the bilateral hila, corresponding to known hilar  lymphadenopathy, is grossly unchanged. No focal consolidation, pleural effusion, or pneumothorax. No acute osseous abnormality. IMPRESSION: 1.  No active cardiopulmonary disease. 2. Unchanged hilar lymphadenopathy, consistent with history of sarcoidosis. Electronically Signed   By: Titus Dubin M.D.   On: 11/15/2016 07:20   Dg Cervical Spine Complete  Result Date: 11/15/2016 CLINICAL DATA:  Back contusion at work.  Initial encounter. EXAM: CERVICAL SPINE - COMPLETE 4+ VIEW COMPARISON:  None. FINDINGS: No evidence of cervical spine fracture or traumatic malalignment. No prevertebral thickening or opaque foreign body. Incidental ossification along the nuchal ligament. Spondylosis and generalized facet spurring. IMPRESSION: No acute finding. Electronically Signed   By: Monte Fantasia M.D.   On: 11/15/2016 07:18    Procedures Procedures (including critical care time)  Medications Ordered in ED Medications - No data to display   Initial Impression / Assessment and Plan / ED Course  I have reviewed the triage vital signs and the nursing notes.  Pertinent labs & imaging results that were available during my care of the patient were reviewed by me and considered in my medical decision making  (see chart for details).        X-ray of the cervical spine shows no acute findings, urinalysis has been sent to look for hematuria, chest x-ray shows no cardiopulmonary disease, unchanged sarcoid findings in the chest. The patient was given reassurance that his exam is consistent with what looks like a peripheral neuropathy or neuropraxia, he will follow-up with neurology. The patient expresses understanding. He appears well and has no other focal neurologic deficits.  UA without blood  xrays neg.  Final Clinical Impressions(s) / ED Diagnoses   Final diagnoses:  Contusion of right back wall of thorax, initial encounter  Paresthesias in right hand    New Prescriptions New Prescriptions   PREDNISONE (DELTASONE) 20 MG TABLET    Take 2 tablets (40 mg total) by mouth daily.     Noemi Chapel, MD 11/15/16 [phone]

## 2016-11-15 NOTE — ED Triage Notes (Signed)
Pt states that he was at work when someone at work dropped a nut onto his back from about 96ft. Pt stated the nut weighed about 1.5lb.

## 2016-11-15 NOTE — ED Notes (Signed)
Pt states was at work & had a large wing nut fell hitting him in the back. Nut fell about 25'. Pt seen where he was sent. Pt states back not improving & right arm & hand swollen & tingling.

## 2016-11-15 NOTE — ED Notes (Signed)
ED Provider at bedside. 

## 2016-11-15 NOTE — Discharge Instructions (Signed)
Please call the office of Dr. Merlene Laughter for a follow up appointment this week  ER for any weakness of the arms or legs or any increasing numbness, slurred speech or difficulty with vision, or balance

## 2016-11-15 NOTE — ED Provider Notes (Addendum)
MSE was initiated and I personally evaluated the patient and placed orders (if any) at  6:38 AM on November 15, 2016.  Patient presents with right paraspinal thoracic back pain since October 9. 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 He was seen at employee health and told he had a back contusion. He comes in today because he has persistent pain not responsive to Tylenol. Also feels he is having some swelling and numbness in his right arm. Denies any neck or head injury.  Patient is in no distress. No midline C or T spine pain. Patient has equal breath sounds. There is a small 2 cm faint ecchymosis to right paraspinal thoracic musculature Equal radial pulses and grip strengths. Equal upper extremity strength. Minimal appreciable swelling in R hand. Equal gross sensation in bilateral arms Patient seems to favor his right shoulder and keeps it raised without realizing it  We'll obtain urinalysis and x-rays. Patient declines pain medication at this time.  The patient appears stable so that the remainder of the MSE may be completed by another provider.   Ezequiel Essex, MD 11/15/16 [id number]    Ezequiel Essex, MD 11/15/16 [phone]

## 2017-01-02 ENCOUNTER — Other Ambulatory Visit: Payer: Self-pay | Admitting: Thoracic Surgery (Cardiothoracic Vascular Surgery)

## 2017-01-02 DIAGNOSIS — R590 Localized enlarged lymph nodes: Secondary | ICD-10-CM

## 2017-01-20 ENCOUNTER — Inpatient Hospital Stay: Admission: RE | Admit: 2017-01-20 | Payer: Self-pay | Source: Ambulatory Visit

## 2017-01-20 ENCOUNTER — Ambulatory Visit: Payer: Self-pay | Admitting: Thoracic Surgery (Cardiothoracic Vascular Surgery)

## 2017-02-04 ENCOUNTER — Ambulatory Visit
Admission: RE | Admit: 2017-02-04 | Discharge: 2017-02-04 | Disposition: A | Discharge status: Home / Self Care | Payer: PPO | Source: Ambulatory Visit | Attending: Thoracic Surgery (Cardiothoracic Vascular Surgery) | Admitting: Thoracic Surgery (Cardiothoracic Vascular Surgery)

## 2017-02-04 DIAGNOSIS — R590 Localized enlarged lymph nodes: Secondary | ICD-10-CM

## 2017-02-04 DIAGNOSIS — R918 Other nonspecific abnormal finding of lung field: Secondary | ICD-10-CM | POA: Diagnosis not present

## 2017-02-10 ENCOUNTER — Encounter: Payer: Self-pay | Admitting: Thoracic Surgery (Cardiothoracic Vascular Surgery)

## 2017-02-10 ENCOUNTER — Ambulatory Visit: Payer: Self-pay | Admitting: Thoracic Surgery (Cardiothoracic Vascular Surgery)

## 2017-02-10 ENCOUNTER — Ambulatory Visit: Payer: PPO | Admitting: Thoracic Surgery (Cardiothoracic Vascular Surgery)

## 2017-02-10 ENCOUNTER — Other Ambulatory Visit: Payer: Self-pay

## 2017-02-10 VITALS — BP 134/95 | HR 65 | Resp 18 | Ht 70.0 in | Wt 245.0 lb

## 2017-02-10 DIAGNOSIS — R590 Localized enlarged lymph nodes: Secondary | ICD-10-CM | POA: Diagnosis not present

## 2017-02-10 NOTE — Progress Notes (Signed)
[address]       [place] [address]             [phone]    HPI: Mr. No returns for a scheduled follow-up visit  She feels a 67 year old gentleman with a history of sarcoidosis, hypertension, type 2 diabetes without complication, and pneumonia.  He was treated for sarcoidosis about 25 years ago and had been in remission ever since.  In November 2015 he presented with cough, fevers, chills, and night sweats.  He was treated with antibiotics and steroids and his symptoms rapidly improved.  In December 2015 a chest x-ray showed right hilar fullness.  His CT showed mediastinal and bilateral hilar adenopathy.  There were some small lung nodules.  A PET/CT in February 2016 showed persistent adenopathy.  There was metabolic activity in the nodes.  We discussed possible biopsy but he was not interested in pursuing that.  We have been following him with CTs since then.  We followed him every 6 months initially.  Most recently I saw him in December 2017.  The findings were stable.  In the interim since his last visit he has not had any respiratory issues.  He was injured when a large wing nut fell from 30 feet and hit him in the back.  He is having a lot of pain from that.  Denies fevers, chills, night sweats, cough, wheezing, shortness of breath.   Past Medical History:  Diagnosis Date  . Abnormal CT of the chest 01/23/14  . Diabetes mellitus without complication ([place])   . Elevated PSA   . Erectile dysfunction   . Gastroesophageal reflux   . H/O sarcoidosis   . Hypertension   . Pneumonia of upper lobe of lung ([place]) 12/1213    Current Outpatient Medications  Medication Sig Dispense Refill  . aspirin EC 81 MG tablet Take 81 mg by mouth daily.    . benazepril (LOTENSIN) 20 MG tablet Take 20 mg by mouth daily.    Marland Kitchen glucosamine-chondroitin 500-400 MG tablet Take 2 tablets by mouth daily.    . hydrochlorothiazide (HYDRODIURIL) 25 MG tablet Take 12.5 mg by mouth daily.      . metFORMIN (GLUCOPHAGE-XR) 500 MG 24 hr tablet Take 500 mg by mouth daily with breakfast.   1  . omeprazole (PRILOSEC) 20 MG capsule Take 1 capsule (20 mg total) by mouth daily. 30 capsule 0  . predniSONE (DELTASONE) 20 MG tablet Take 2 tablets (40 mg total) by mouth daily. 10 tablet 0   No current facility-administered medications for this visit.     Physical Exam BP (!) 134/95 (BP Location: Right Arm, Patient Position: Sitting, Cuff Size: Large)   Pulse 65   Resp 18   Ht 5\' 10"  (1.778 m)   Wt 245 lb (111.1 kg)   SpO2 98% Comment: RA  BMI 35.15 kg/m  Well-appearing 67 year old man in no acute distress Alert and oriented x3 with no focal deficits No cervical or subclavicular adenopathy Lungs clear with equal breath sounds bilaterally Cardiac regular rate and rhythm normal S1 and S2  Diagnostic Tests: CT CHEST WITHOUT CONTRAST  TECHNIQUE: Multidetector CT imaging of the chest was performed following the standard protocol without IV contrast.  COMPARISON:  01/29/2016 chest CT.  FINDINGS: Cardiovascular: Normal heart size. No significant pericardial fluid/thickening. Three-vessel coronary atherosclerosis. Normal course and caliber of the thoracic aorta. Main pulmonary artery diameter 3.1 cm, stable, top-normal.  Mediastinum/Nodes: No discrete thyroid nodules. Unremarkable esophagus.  No axillary adenopathy. Stable mild right paratracheal adenopathy measuring up to 1.0 cm (series 2/ image 54). Stable mildly enlarged 1.0 cm subcarinal node (series 2/ image 67). Stable mildly enlarged 1.0 cm AP window node (series 2/ image 54). Stable mild-to-moderate bilateral hilar adenopathy, poorly delineated on these noncontrast images.  Lungs/Pleura: No pneumothorax. No pleural effusion. No acute consolidative airspace disease or lung masses. Stable left upper and left lower lobe parenchymal bands compatible with postinfectious/ postinflammatory scarring. Scattered tiny solid  pulmonary nodules in the right middle lobe and bilateral upper lobes, all unchanged. No new significant pulmonary nodules. No significant bronchiectasis.  Upper abdomen: Partially visualized simple appearing 4.8 cm upper right renal cysts. Nonobstructing 2 mm upper right renal stone, stable.  Musculoskeletal: No aggressive appearing focal osseous lesions. Mild thoracic spondylosis.  IMPRESSION: 1. Stable mediastinal and bilateral hilar adenopathy, compatible with sarcoidosis. 2. Stable scattered tiny upper lung predominant pulmonary nodules, considered benign, probably due to sarcoidosis. No new or progressive pulmonary disease. 3. Three-vessel coronary atherosclerosis. 4. Nonobstructing punctate upper right renal stone.   Electronically Signed   By: Ilona Sorrel M.D.   On: 02/04/2017 10:15 I personally reviewed the CT images and concur with the findings noted above  Impression: Mr. Szymborski is a 67 year old man with a history of sarcoidosis who has mediastinal and bilateral hilar adenopathy that has been stable for about 3 years now.  I do not think any further workup is needed at this time.  Lung nodules-multiple scattered tiny pulmonary nodules that are stable over 3 years.  Consistent with benign etiology.  Likely sarcoidosis  Plan: I do not think any further follow-up CT is needed for this issue.  I will be happy to see Mr. Ivie back again anytime in the future if I can be of any further assistance with his care  [name], MD [facility] [phone]

## 2017-02-17 ENCOUNTER — Emergency Department (HOSPITAL_COMMUNITY): Payer: Worker's Compensation

## 2017-02-17 ENCOUNTER — Emergency Department (HOSPITAL_COMMUNITY)
Admission: EM | Admit: 2017-02-17 | Discharge: 2017-02-17 | Disposition: A | Discharge status: Home / Self Care | Payer: Worker's Compensation | Attending: Emergency Medicine | Admitting: Emergency Medicine

## 2017-02-17 ENCOUNTER — Encounter (HOSPITAL_COMMUNITY): Payer: Self-pay | Admitting: *Deleted

## 2017-02-17 ENCOUNTER — Other Ambulatory Visit: Payer: Self-pay

## 2017-02-17 DIAGNOSIS — I10 Essential (primary) hypertension: Secondary | ICD-10-CM | POA: Insufficient documentation

## 2017-02-17 DIAGNOSIS — Z79899 Other long term (current) drug therapy: Secondary | ICD-10-CM | POA: Diagnosis not present

## 2017-02-17 DIAGNOSIS — Z7982 Long term (current) use of aspirin: Secondary | ICD-10-CM | POA: Diagnosis not present

## 2017-02-17 DIAGNOSIS — Z87891 Personal history of nicotine dependence: Secondary | ICD-10-CM | POA: Diagnosis not present

## 2017-02-17 DIAGNOSIS — E119 Type 2 diabetes mellitus without complications: Secondary | ICD-10-CM | POA: Diagnosis not present

## 2017-02-17 DIAGNOSIS — G8929 Other chronic pain: Secondary | ICD-10-CM

## 2017-02-17 DIAGNOSIS — R51 Headache: Secondary | ICD-10-CM | POA: Diagnosis not present

## 2017-02-17 DIAGNOSIS — H9313 Tinnitus, bilateral: Secondary | ICD-10-CM | POA: Insufficient documentation

## 2017-02-17 DIAGNOSIS — Z7984 Long term (current) use of oral hypoglycemic drugs: Secondary | ICD-10-CM | POA: Insufficient documentation

## 2017-02-17 DIAGNOSIS — R531 Weakness: Secondary | ICD-10-CM | POA: Diagnosis not present

## 2017-02-17 DIAGNOSIS — R200 Anesthesia of skin: Secondary | ICD-10-CM | POA: Diagnosis not present

## 2017-02-17 MED ORDER — METOCLOPRAMIDE HCL 5 MG/ML IJ SOLN
10.0000 mg | Freq: Once | INTRAMUSCULAR | Status: AC
  Administered 2017-02-17: 10 mg via INTRAVENOUS
  Filled 2017-02-17: qty 2

## 2017-02-17 MED ORDER — SODIUM CHLORIDE 0.9 % IV BOLUS (SEPSIS)
1000.0000 mL | Freq: Once | INTRAVENOUS | Status: AC
  Administered 2017-02-17: 1000 mL via INTRAVENOUS

## 2017-02-17 MED ORDER — DIPHENHYDRAMINE HCL 50 MG/ML IJ SOLN
25.0000 mg | Freq: Once | INTRAMUSCULAR | Status: AC
  Administered 2017-02-17: 25 mg via INTRAVENOUS
  Filled 2017-02-17: qty 1

## 2017-02-17 NOTE — ED Triage Notes (Signed)
Pt c/o headache and "ringing in the ears" x 1 month and is having some right arm pain

## 2017-02-17 NOTE — Discharge Instructions (Signed)
Call Dr Deeann Saint office to have him evaluate the ringing in your ears. Sometimes "white" noise such as a fan or a radio/TV with the volume on low will help you not notice the ringing and help you sleep. Keep your appointments with Dr Maxie Better. Either see your primary care doctor or Dr Merlene Laughter a neurologist if you continue to have headaches.

## 2017-02-17 NOTE — ED Provider Notes (Signed)
[facility] Provider Note   CSN: [id number] Arrival date & time: 02/17/17  0011  Recheck 05:40 AM   History   Chief Complaint Chief Complaint  Patient presents with  . Headache    HPI Thomas Malone is a 67 y.o. male.  HPI patient reports a 1-1/2 pound weight and not fell about 30 feet and hit him in the back of the neck while at work in October.  He has been treated by Dr. Maxie Better in [place].  He has been getting physical therapy twice a week and is waiting to get approval for getting spinal injections.  He reports that when this happened he did not fall or hit his head however they feel he suffered from whiplash from the injury.  He was started on naproxen and Neurontin and was on them for about 3 weeks when they stopped them about a month ago.  He states he started getting headaches and ringing in his ears which they thought may have been from the medication.  However he states it is not improved and is actually getting worse.  He states he hears a high-pitched ringing in both ears that is constant.  He also states he has a frontal and top of his head headache that he describes as pressure and achy.  However it does get worse at times at which time he states that she has "painful".  He states nothing makes the headache worse, nothing makes it feel better.  He denies nausea or vomiting.  He does complain of some numbness and weakness in his right upper extremity from his shoulder to his hand however that is related to the neck injury that he is already being treated for.  Patient is right-handed.  He states he is noticing he is getting loss of grip strength in his right hand.   PCP Lemmie Evens, MD Orthopedics Dr Maxie Better   Past Medical History:  Diagnosis Date  . Abnormal CT of the chest 01/23/14  . Diabetes mellitus without complication ([place])   . Elevated PSA   . Erectile dysfunction   . Gastroesophageal reflux   . H/O sarcoidosis   . Hypertension   .  Pneumonia of upper lobe of lung ([place]) 12/1213    Patient Active Problem List   Diagnosis Date Noted  . Hilar adenopathy 06/13/2014  . H/O sarcoidosis   . Gastroesophageal reflux   . Hypertension   . Erectile dysfunction   . Pneumonia of upper lobe of lung ([place])   . Abnormal CT of the chest 01/23/2014    Past Surgical History:  Procedure Laterality Date  . COLONOSCOPY    . COLONOSCOPY N/A 06/23/2014   Procedure: COLONOSCOPY;  Surgeon: Aviva Signs Md, MD;  Location: [facility];  Service: Gastroenterology;  Laterality: N/A;  . ORTHOPEDIC SURGERY     heel surgery  . PROSTATE BIOPSY    . TONSILLECTOMY         Home Medications    Prior to Admission medications   Medication Sig Start Date End Date Taking? Authorizing Provider  aspirin EC 81 MG tablet Take 81 mg by mouth daily.    [provider]  benazepril (LOTENSIN) 20 MG tablet Take 20 mg by mouth daily.    [provider]  glucosamine-chondroitin 500-400 MG tablet Take 2 tablets by mouth daily.    [provider]  hydrochlorothiazide (HYDRODIURIL) 25 MG tablet Take 12.5 mg by mouth daily.     [provider]  metFORMIN (GLUCOPHAGE-XR) 500 MG 24 hr tablet Take 500 mg by mouth daily with breakfast.  01/26/16   [provider]  omeprazole (PRILOSEC) 20 MG capsule Take 1 capsule (20 mg total) by mouth daily. 05/04/15   Leonard Schwartz, MD  predniSONE (DELTASONE) 20 MG tablet Take 2 tablets (40 mg total) by mouth daily. 11/15/16   Noemi Chapel, MD    Family History Family History  Problem Relation Age of Onset  . Cancer Mother   . Heart disease Father   . Hyperlipidemia Sister   . Diabetes Sister   . Heart disease Sister   . Hyperlipidemia Brother   . Diabetes Brother     Social History Social History   Tobacco Use  . Smoking status: Former Smoker    Types: Cigars  . Smokeless tobacco: Never Used  Substance Use Topics  . Alcohol use: No  . Drug use: No  lives with  spouse   Allergies   Levaquin [levofloxacin in d5w]; Lidocaine; and Voltaren [diclofenac sodium]   Review of Systems Review of Systems  All other systems reviewed and are negative.    Physical Exam Updated Vital Signs BP (!) 132/92 (BP Location: Left Arm)   Pulse 67   Temp 97.7 F (36.5 C) (Oral)   Resp 18   Ht 5\' 10"  (1.778 m)   Wt 111.1 kg (245 lb)   SpO2 96%   BMI 35.15 kg/m   Physical Exam  Constitutional: He is oriented to person, place, and time. He appears well-developed and well-nourished.  Non-toxic appearance. He does not appear ill. No distress.  HENT:  Head: Normocephalic and atraumatic.  Right Ear: External ear normal.  Left Ear: External ear normal.  Nose: Nose normal. No mucosal edema or rhinorrhea.  Mouth/Throat: Oropharynx is clear and moist and mucous membranes are normal. No dental abscesses or uvula swelling.  Eyes: Conjunctivae and EOM are normal. Pupils are equal, round, and reactive to light.  Neck: Full passive range of motion without pain.  Patient has diffuse tenderness of his posterior cervical spine and over his trapezius muscles.  Cardiovascular: Normal rate, regular rhythm and normal heart sounds. Exam reveals no gallop and no friction rub.  No murmur heard. Pulmonary/Chest: Effort normal and breath sounds normal. No respiratory distress. He has no wheezes. He has no rhonchi. He has no rales. He exhibits no tenderness and no crepitus.  Abdominal: Normal appearance. There is no rebound.  Musculoskeletal: Normal range of motion. He exhibits no edema or tenderness.  Moves all extremities well.   Neurological: He is alert and oriented to person, place, and time. He has normal strength. No cranial nerve deficit.  Skin: Skin is warm, dry and intact. No rash noted. No erythema. No pallor.  Psychiatric: He has a normal mood and affect. His speech is normal and behavior is normal. His mood appears not anxious.  Nursing note and vitals  reviewed.    ED Treatments / Results  Labs (all labs ordered are listed, but only abnormal results are displayed) Labs Reviewed - No data to display  EKG  EKG Interpretation  Date/Time:  Tuesday February 17 2017 04:21:38 EST Ventricular Rate:  59 PR Interval:    QRS Duration: 108 QT Interval:  427 QTC Calculation: 423 R Axis:   -24 Text Interpretation:  Sinus rhythm Borderline left axis deviation RSR' in V1 or V2, probably normal variant No significant change since last tracing 04 May 2015 Confirmed by Rolland Porter [phone]) on 02/17/2017 5:32:01 AM  Radiology Ct Head Wo Contrast  Result Date: 02/17/2017 CLINICAL DATA:  14-year-old male with headache. EXAM: CT HEAD WITHOUT CONTRAST TECHNIQUE: Contiguous axial images were obtained from the base of the skull through the vertex without intravenous contrast. COMPARISON:  None. FINDINGS: Brain: No evidence of acute infarction, hemorrhage, hydrocephalus, extra-axial collection or mass lesion/mass effect. Vascular: No hyperdense vessel or unexpected calcification. Skull: Normal. Negative for fracture or focal lesion. Sinuses/Orbits: Mild mucoperiosteal thickening of paranasal sinuses. Bilateral mastoid effusions, left greater right. Other: None IMPRESSION: Normal noncontrast CT of the brain. Electronically Signed   By: Anner Crete M.D.   On: 02/17/2017 06:52    Procedures Procedures (including critical care time)  Medications Ordered in ED Medications  sodium chloride 0.9 % bolus 1,000 mL (0 mLs Intravenous Stopped 02/17/17 0739)  metoCLOPramide (REGLAN) injection 10 mg (10 mg Intravenous Given 02/17/17 0622)  diphenhydrAMINE (BENADRYL) injection 25 mg (25 mg Intravenous Given 02/17/17 0622)     Initial Impression / Assessment and Plan / ED Course  I have reviewed the triage vital signs and the nursing notes.  Pertinent labs & imaging results that were available during my care of the patient were reviewed by me and considered in  my medical decision making (see chart for details).     Patient was given a migraine cocktail and CT of the head was done.  Recheck at 7 AM patient is sound asleep.  Wife states he has not slept a long time.  I am unable to get him to awaken, he awakens briefly however I am not convinced he is fully awake.  He is only mumbling when he responds verbally.  We had already discussed that he would need to follow-up with ENT about the ringing in his ears.  There is some concern for Mnire's disease however that would need to be evaluated by the ENT.  Recheck at 8 AM patient is sitting on the side of the stretcher at the bedside.  He states his headaches better.  However he seems very sleepy.  He feels ready to go home.  Wife states she has help to help her get him into the house.  Final Clinical Impressions(s) / ED Diagnoses   Final diagnoses:  Chronic nonintractable headache, unspecified headache type  Tinnitus of both ears    ED Discharge Orders    None      Plan discharge  Rolland Porter, MD, Barbette Or, MD 02/17/17 [phone]

## 2017-03-20 DIAGNOSIS — I10 Essential (primary) hypertension: Secondary | ICD-10-CM | POA: Diagnosis not present

## 2017-03-20 DIAGNOSIS — E6609 Other obesity due to excess calories: Secondary | ICD-10-CM | POA: Diagnosis not present

## 2017-03-20 DIAGNOSIS — N183 Chronic kidney disease, stage 3 (moderate): Secondary | ICD-10-CM | POA: Diagnosis not present

## 2017-03-20 DIAGNOSIS — E1165 Type 2 diabetes mellitus with hyperglycemia: Secondary | ICD-10-CM | POA: Diagnosis not present

## 2017-03-24 DIAGNOSIS — N529 Male erectile dysfunction, unspecified: Secondary | ICD-10-CM | POA: Diagnosis not present

## 2017-03-24 DIAGNOSIS — E1165 Type 2 diabetes mellitus with hyperglycemia: Secondary | ICD-10-CM | POA: Diagnosis not present

## 2017-03-24 DIAGNOSIS — E6609 Other obesity due to excess calories: Secondary | ICD-10-CM | POA: Diagnosis not present

## 2017-03-24 DIAGNOSIS — I10 Essential (primary) hypertension: Secondary | ICD-10-CM | POA: Diagnosis not present

## 2017-04-29 DIAGNOSIS — N183 Chronic kidney disease, stage 3 (moderate): Secondary | ICD-10-CM | POA: Diagnosis not present

## 2017-04-29 DIAGNOSIS — M542 Cervicalgia: Secondary | ICD-10-CM | POA: Diagnosis not present

## 2017-04-29 DIAGNOSIS — I10 Essential (primary) hypertension: Secondary | ICD-10-CM | POA: Diagnosis not present

## 2017-04-29 DIAGNOSIS — D869 Sarcoidosis, unspecified: Secondary | ICD-10-CM | POA: Diagnosis not present

## 2017-04-30 DIAGNOSIS — H9313 Tinnitus, bilateral: Secondary | ICD-10-CM | POA: Diagnosis not present

## 2017-04-30 DIAGNOSIS — H903 Sensorineural hearing loss, bilateral: Secondary | ICD-10-CM | POA: Diagnosis not present

## 2017-05-12 DIAGNOSIS — I10 Essential (primary) hypertension: Secondary | ICD-10-CM | POA: Diagnosis not present

## 2017-05-12 DIAGNOSIS — N183 Chronic kidney disease, stage 3 (moderate): Secondary | ICD-10-CM | POA: Diagnosis not present

## 2017-05-14 DIAGNOSIS — I10 Essential (primary) hypertension: Secondary | ICD-10-CM | POA: Diagnosis not present

## 2017-07-24 DIAGNOSIS — I10 Essential (primary) hypertension: Secondary | ICD-10-CM | POA: Diagnosis not present

## 2017-07-24 DIAGNOSIS — R7989 Other specified abnormal findings of blood chemistry: Secondary | ICD-10-CM | POA: Diagnosis not present

## 2017-08-10 DIAGNOSIS — I10 Essential (primary) hypertension: Secondary | ICD-10-CM | POA: Diagnosis not present

## 2017-08-10 DIAGNOSIS — N401 Enlarged prostate with lower urinary tract symptoms: Secondary | ICD-10-CM | POA: Diagnosis not present

## 2017-08-10 DIAGNOSIS — E1165 Type 2 diabetes mellitus with hyperglycemia: Secondary | ICD-10-CM | POA: Diagnosis not present

## 2017-08-10 DIAGNOSIS — R7989 Other specified abnormal findings of blood chemistry: Secondary | ICD-10-CM | POA: Diagnosis not present

## 2017-08-12 DIAGNOSIS — N401 Enlarged prostate with lower urinary tract symptoms: Secondary | ICD-10-CM | POA: Diagnosis not present

## 2017-08-12 DIAGNOSIS — R7989 Other specified abnormal findings of blood chemistry: Secondary | ICD-10-CM | POA: Diagnosis not present

## 2017-08-12 DIAGNOSIS — I10 Essential (primary) hypertension: Secondary | ICD-10-CM | POA: Diagnosis not present

## 2017-08-12 DIAGNOSIS — E1165 Type 2 diabetes mellitus with hyperglycemia: Secondary | ICD-10-CM | POA: Diagnosis not present

## 2017-08-27 ENCOUNTER — Other Ambulatory Visit (HOSPITAL_COMMUNITY): Payer: Self-pay | Admitting: Neurosurgery

## 2017-08-27 DIAGNOSIS — M4722 Other spondylosis with radiculopathy, cervical region: Secondary | ICD-10-CM | POA: Diagnosis not present

## 2017-08-27 DIAGNOSIS — M25511 Pain in right shoulder: Secondary | ICD-10-CM

## 2017-08-27 DIAGNOSIS — G5602 Carpal tunnel syndrome, left upper limb: Secondary | ICD-10-CM | POA: Diagnosis not present

## 2017-08-27 DIAGNOSIS — M25551 Pain in right hip: Secondary | ICD-10-CM

## 2017-08-27 DIAGNOSIS — Z6835 Body mass index (BMI) 35.0-35.9, adult: Secondary | ICD-10-CM | POA: Diagnosis not present

## 2017-08-27 DIAGNOSIS — G5601 Carpal tunnel syndrome, right upper limb: Secondary | ICD-10-CM | POA: Diagnosis not present

## 2017-09-08 ENCOUNTER — Ambulatory Visit (HOSPITAL_COMMUNITY)
Admission: RE | Admit: 2017-09-08 | Discharge: 2017-09-08 | Disposition: A | Discharge status: Home / Self Care | Payer: PPO | Source: Ambulatory Visit | Attending: Neurosurgery | Admitting: Neurosurgery

## 2017-09-08 DIAGNOSIS — M47812 Spondylosis without myelopathy or radiculopathy, cervical region: Secondary | ICD-10-CM | POA: Diagnosis not present

## 2017-09-08 DIAGNOSIS — M75101 Unspecified rotator cuff tear or rupture of right shoulder, not specified as traumatic: Secondary | ICD-10-CM | POA: Insufficient documentation

## 2017-09-08 DIAGNOSIS — M4722 Other spondylosis with radiculopathy, cervical region: Secondary | ICD-10-CM | POA: Insufficient documentation

## 2017-09-08 DIAGNOSIS — M25511 Pain in right shoulder: Secondary | ICD-10-CM | POA: Insufficient documentation

## 2017-09-08 DIAGNOSIS — M50223 Other cervical disc displacement at C6-C7 level: Secondary | ICD-10-CM | POA: Diagnosis not present

## 2017-09-10 DIAGNOSIS — Z6834 Body mass index (BMI) 34.0-34.9, adult: Secondary | ICD-10-CM | POA: Diagnosis not present

## 2017-09-10 DIAGNOSIS — G5602 Carpal tunnel syndrome, left upper limb: Secondary | ICD-10-CM | POA: Diagnosis not present

## 2017-09-10 DIAGNOSIS — G5601 Carpal tunnel syndrome, right upper limb: Secondary | ICD-10-CM | POA: Diagnosis not present

## 2017-09-10 DIAGNOSIS — G5623 Lesion of ulnar nerve, bilateral upper limbs: Secondary | ICD-10-CM | POA: Diagnosis not present

## 2017-09-14 DIAGNOSIS — Z6834 Body mass index (BMI) 34.0-34.9, adult: Secondary | ICD-10-CM | POA: Diagnosis not present

## 2017-09-14 DIAGNOSIS — M4722 Other spondylosis with radiculopathy, cervical region: Secondary | ICD-10-CM | POA: Diagnosis not present

## 2017-09-15 ENCOUNTER — Other Ambulatory Visit: Payer: Self-pay | Admitting: Neurosurgery

## 2017-09-18 DIAGNOSIS — K210 Gastro-esophageal reflux disease with esophagitis: Secondary | ICD-10-CM | POA: Diagnosis not present

## 2017-09-18 DIAGNOSIS — M79621 Pain in right upper arm: Secondary | ICD-10-CM | POA: Diagnosis not present

## 2017-09-18 DIAGNOSIS — E1165 Type 2 diabetes mellitus with hyperglycemia: Secondary | ICD-10-CM | POA: Diagnosis not present

## 2017-09-18 DIAGNOSIS — I10 Essential (primary) hypertension: Secondary | ICD-10-CM | POA: Diagnosis not present

## 2017-09-30 NOTE — Pre-Procedure Instructions (Signed)
[place]  09/30/2017      [facility], [place] - [id number] [place] [address] Phone: [phone] Fax: [phone]    Your procedure is scheduled on Friday September 6th.  Report to [facility] Admitting at 1100 A.M.  Call this number if you have problems the morning of surgery:  [phone]   Remember:  Do not eat or drink after midnight.      Take these medicines the morning of surgery with A SIP OF WATER   Uroxatral  Omeprazole  Tramadol (if needed)   7 days prior to surgery STOP taking any Aspirin(unless otherwise instructed by your surgeon), Aleve, Naproxen, Ibuprofen, Motrin, Advil, Goody's, BC's, all herbal medications, fish oil, and all vitamins   WHAT DO I DO ABOUT MY DIABETES MEDICATION?   Marland Kitchen Do not take oral diabetes medicines (pills) the morning of surgery.   How to Manage Your Diabetes Before and After Surgery  Why is it important to control my blood sugar before and after surgery? . Improving blood sugar levels before and after surgery helps healing and can limit problems. . A way of improving blood sugar control is eating a healthy diet by: o  Eating less sugar and carbohydrates o  Increasing activity/exercise o  Talking with your doctor about reaching your blood sugar goals . High blood sugars (greater than 180 mg/dL) can raise your risk of infections and slow your recovery, so you will need to focus on controlling your diabetes during the weeks before surgery. . Make sure that the doctor who takes care of your diabetes knows about your planned surgery including the date and location.  How do I manage my blood sugar before surgery? . Check your blood sugar at least 4 times a day, starting 2 days before surgery, to make sure that the level is not too high or low. o Check your blood sugar the morning of your surgery when you wake up and every 2 hours until you get to the Short Stay  unit. . If your blood sugar is less than 70 mg/dL, you will need to treat for low blood sugar: o Do not take insulin. o Treat a low blood sugar (less than 70 mg/dL) with  cup of clear juice (cranberry or apple), 4 glucose tablets, OR glucose gel. o Recheck blood sugar in 15 minutes after treatment (to make sure it is greater than 70 mg/dL). If your blood sugar is not greater than 70 mg/dL on recheck, call [phone] for further instructions. . Report your blood sugar to the short stay nurse when you get to Short Stay.  . If you are admitted to the hospital after surgery: o Your blood sugar will be checked by the staff and you will probably be given insulin after surgery (instead of oral diabetes medicines) to make sure you have good blood sugar levels. o The goal for blood sugar control after surgery is 80-180 mg/dL      Do not wear jewelry  Do not wear lotions, powders, or colognes, or deodorant.  Do not shave 48 hours prior to surgery.  Men may shave face and neck.  Do not bring valuables to the hospital.  [facility] is not responsible for any belongings or valuables.  Contacts, dentures or bridgework may not be worn into surgery.  Leave your suitcase in the car.  After surgery it may be brought to your room.  For patients  admitted to the hospital, discharge time will be determined by your treatment team.  Patients discharged the day of surgery will not be allowed to drive home.    [facility]- Preparing For Surgery  Before surgery, you can play an important role. Because skin is not sterile, your skin needs to be as free of germs as possible. You can reduce the number of germs on your skin by washing with CHG (chlorahexidine gluconate) Soap before surgery.  CHG is an antiseptic cleaner which kills germs and bonds with the skin to continue killing germs even after washing.    Oral Hygiene is also important to reduce your risk of infection.  Remember - BRUSH YOUR TEETH THE MORNING  OF SURGERY WITH YOUR REGULAR TOOTHPASTE  Please do not use if you have an allergy to CHG or antibacterial soaps. If your skin becomes reddened/irritated stop using the CHG.  Do not shave (including legs and underarms) for at least 48 hours prior to first CHG shower. It is OK to shave your face.  Please follow these instructions carefully.   1. Shower the NIGHT BEFORE SURGERY and the MORNING OF SURGERY with CHG.   2. If you chose to wash your hair, wash your hair first as usual with your normal shampoo.  3. After you shampoo, rinse your hair and body thoroughly to remove the shampoo.  4. Use CHG as you would any other liquid soap. You can apply CHG directly to the skin and wash gently with a scrungie or a clean washcloth.   5. Apply the CHG Soap to your body ONLY FROM THE NECK DOWN.  Do not use on open wounds or open sores. Avoid contact with your eyes, ears, mouth and genitals (private parts). Wash Face and genitals (private parts)  with your normal soap.  6. Wash thoroughly, paying special attention to the area where your surgery will be performed.  7. Thoroughly rinse your body with warm water from the neck down.  8. DO NOT shower/wash with your normal soap after using and rinsing off the CHG Soap.  9. Pat yourself dry with a CLEAN TOWEL.  10. Wear CLEAN PAJAMAS to bed the night before surgery, wear comfortable clothes the morning of surgery  11. Place CLEAN SHEETS on your bed the night of your first shower and DO NOT SLEEP WITH PETS.    Day of Surgery:  Do not apply any deodorants/lotions.  Please wear clean clothes to the hospital/surgery center.   Remember to brush your teeth WITH YOUR REGULAR TOOTHPASTE.    Please read over the following fact sheets that you were given. Coughing and Deep Breathing, MRSA Information and Surgical Site Infection Prevention

## 2017-10-01 ENCOUNTER — Other Ambulatory Visit: Payer: Self-pay

## 2017-10-01 ENCOUNTER — Encounter (HOSPITAL_COMMUNITY): Payer: Self-pay | Admitting: Physician Assistant

## 2017-10-01 ENCOUNTER — Encounter (HOSPITAL_COMMUNITY)
Admission: RE | Admit: 2017-10-01 | Discharge: 2017-10-01 | Disposition: A | Discharge status: Home / Self Care | Payer: PPO | Source: Ambulatory Visit | Attending: Neurosurgery | Admitting: Neurosurgery

## 2017-10-01 ENCOUNTER — Encounter (HOSPITAL_COMMUNITY): Payer: Self-pay

## 2017-10-01 DIAGNOSIS — M4802 Spinal stenosis, cervical region: Secondary | ICD-10-CM | POA: Insufficient documentation

## 2017-10-01 DIAGNOSIS — E119 Type 2 diabetes mellitus without complications: Secondary | ICD-10-CM | POA: Diagnosis not present

## 2017-10-01 DIAGNOSIS — I10 Essential (primary) hypertension: Secondary | ICD-10-CM | POA: Insufficient documentation

## 2017-10-01 DIAGNOSIS — Z7984 Long term (current) use of oral hypoglycemic drugs: Secondary | ICD-10-CM | POA: Diagnosis not present

## 2017-10-01 DIAGNOSIS — K219 Gastro-esophageal reflux disease without esophagitis: Secondary | ICD-10-CM | POA: Diagnosis not present

## 2017-10-01 DIAGNOSIS — Z01812 Encounter for preprocedural laboratory examination: Secondary | ICD-10-CM | POA: Diagnosis not present

## 2017-10-01 DIAGNOSIS — Z7982 Long term (current) use of aspirin: Secondary | ICD-10-CM | POA: Insufficient documentation

## 2017-10-01 DIAGNOSIS — Z79899 Other long term (current) drug therapy: Secondary | ICD-10-CM | POA: Insufficient documentation

## 2017-10-01 DIAGNOSIS — Z87891 Personal history of nicotine dependence: Secondary | ICD-10-CM | POA: Insufficient documentation

## 2017-10-01 DIAGNOSIS — Z79891 Long term (current) use of opiate analgesic: Secondary | ICD-10-CM | POA: Insufficient documentation

## 2017-10-01 HISTORY — DX: Adverse effect of unspecified anesthetic, initial encounter: T41.45XA

## 2017-10-01 HISTORY — DX: Other complications of anesthesia, initial encounter: T88.59XA

## 2017-10-01 HISTORY — DX: Headache: R51

## 2017-10-01 HISTORY — DX: Headache, unspecified: R51.9

## 2017-10-01 LAB — BASIC METABOLIC PANEL
Anion gap: 8 (ref 5–15)
BUN: 18 mg/dL (ref 8–23)
CHLORIDE: 107 mmol/L (ref 98–111)
CO2: 24 mmol/L (ref 22–32)
CREATININE: 1.8 mg/dL — AB (ref 0.61–1.24)
Calcium: 9.5 mg/dL (ref 8.9–10.3)
GFR calc Af Amer: 44 mL/min — ABNORMAL LOW (ref >60)
GFR calc non Af Amer: 38 mL/min — ABNORMAL LOW (ref >60)
Glucose, Bld: 186 mg/dL — ABNORMAL HIGH (ref 70–99)
POTASSIUM: 4 mmol/L (ref 3.5–5.1)
SODIUM: 139 mmol/L (ref 135–145)

## 2017-10-01 LAB — CBC
HCT: 41.1 % (ref 39.0–52.0)
HEMOGLOBIN: 13.2 g/dL (ref 13.0–17.0)
MCH: 27.4 pg (ref 26.0–34.0)
MCHC: 32.1 g/dL (ref 30.0–36.0)
MCV: 85.3 fL (ref 78.0–100.0)
Platelets: 223 10*3/uL (ref 150–400)
RBC: 4.82 MIL/uL (ref 4.22–5.81)
RDW: 12.2 % (ref 11.5–15.5)
WBC: 5.1 10*3/uL (ref 4.0–10.5)

## 2017-10-01 LAB — HEMOGLOBIN A1C
Hgb A1c MFr Bld: 7.7 % — ABNORMAL HIGH (ref 4.8–5.6)
Mean Plasma Glucose: 174.29 mg/dL

## 2017-10-01 LAB — GLUCOSE, CAPILLARY: GLUCOSE-CAPILLARY: 215 mg/dL — AB (ref 70–99)

## 2017-10-01 LAB — TYPE AND SCREEN
ABO/RH(D): A POS
ANTIBODY SCREEN: NEGATIVE

## 2017-10-01 LAB — SURGICAL PCR SCREEN
MRSA, PCR: NEGATIVE
Staphylococcus aureus: NEGATIVE

## 2017-10-01 LAB — ABO/RH: ABO/RH(D): A POS

## 2017-10-01 NOTE — Progress Notes (Addendum)
PCP - Lemmie Evens MD  EKG -  02/17/17 Stress Test -  > 20 years   Fasting Blood Sugar -  105 Checks Blood Sugar __1___ times a day  Aspirin Instructions: last dose taken today. Will not taken anymore until after surgery  Anesthesia review: yes, lab work  Patient denies shortness of breath, fever, cough and chest pain at PAT appointment   Patient verbalized understanding of instructions that were given to them at the PAT appointment. Patient was also instructed that they will need to review over the PAT instructions again at home before surgery.

## 2017-10-01 NOTE — Progress Notes (Signed)
Pt creatinine came back 1.80. Nikki at Dr. Lacy Duverney office notified. Chart sent to anesthesia for review

## 2017-10-02 NOTE — Progress Notes (Addendum)
Anesthesia Chart Review:  Case:  [id number] Date/Time:  10/09/17 1236   Procedure:  Cervical 4-5 Cervical 5-6 Cervical 6-7 Anterior cervical decompression/discectomy/fusion (N/A ) - Cervical 4-5 Cervical 5-6 Cervical 6-7 Anterior cervical decompression/discectomy/fusion   Anesthesia type:  General   Pre-op diagnosis:  Cervical stenosis   Location:  [facility] OR ROOM 20 / [facility]   Surgeon:  Ashok Pall, MD      DISCUSSION: 67 yo male former smoker for above procedure. Pertinent hx includes GERD, HTN, DMII, HA, Sarcoidosis, Renal insufficiency, "Hard to wake up" from anesthesia.  Elevated creatinine 1.80 on preop labs. Review of previous labs shows baseline Cr ~1.6. Last OV note from pt's PCP Carlis Abbott, NP dated 09/18/2017 lists CKDIII followed since 2016. Upcoming cervical fusion also discussed at that visit. Creatinine, Ser (mg/dL)  Date Value  10/01/2017 1.80 (H)  05/04/2015 1.64 (H)  01/23/2014 1.50 (H)   Pt follows with Dr. Roxan Hockey for monitoring of sarcoidosis and mediastinal/bilateral hilar adenopathy. He was last seen 02/10/2017 and at that time he was stable, no further follow-up CT recommended, advised to f/u PRN.  Anticipate he can proceed with surgery as planned barring acute status change.  VS: BP 134/82   Pulse 63   Temp 36.6 C (Oral)   Resp 20   Ht 5\' 10"  (1.778 m)   Wt 108.2 kg   SpO2 99%   BMI 34.21 kg/m   PROVIDERS: Lemmie Evens, MD is PCP last seen in office by Carlis Abbott, NP 09/18/2017  LABS: Elevated creatinine 1.80. Results faxed to PCP with request for input. (all labs ordered are listed, but only abnormal results are displayed)  Labs Reviewed  GLUCOSE, CAPILLARY - Abnormal; Notable for the following components:      Result Value   Glucose-Capillary 215 (*)    All other components within normal limits  BASIC METABOLIC PANEL - Abnormal; Notable for the following components:   Glucose, Bld 186 (*)    Creatinine, Ser 1.80 (*)    GFR calc non Af  Amer 38 (*)    GFR calc Af Amer 44 (*)    All other components within normal limits  HEMOGLOBIN A1C - Abnormal; Notable for the following components:   Hgb A1c MFr Bld 7.7 (*)    All other components within normal limits  SURGICAL PCR SCREEN  CBC  TYPE AND SCREEN  ABO/RH     IMAGES: CT Chest 02/04/2017: IMPRESSION: 1. Stable mediastinal and bilateral hilar adenopathy, compatible with sarcoidosis. 2. Stable scattered tiny upper lung predominant pulmonary nodules, considered benign, probably due to sarcoidosis. No new or progressive pulmonary disease. 3. Three-vessel coronary atherosclerosis. 4. Nonobstructing punctate upper right renal stone.  EKG: 02/17/2017: Sinus rhythm. Borderline LAD.  CV: N/A  Past Medical History:  Diagnosis Date  . Abnormal CT of the chest 01/23/14  . Complication of anesthesia    hard to wake up   . Diabetes mellitus without complication ([place])   . Elevated PSA   . Erectile dysfunction   . Gastroesophageal reflux   . H/O sarcoidosis   . Headache    7-8 months from gabapentin and naproxen  . Hypertension   . Pneumonia of upper lobe of lung ([place]) 12/1213    Past Surgical History:  Procedure Laterality Date  . COLONOSCOPY    . COLONOSCOPY N/A 06/23/2014   Procedure: COLONOSCOPY;  Surgeon: Aviva Signs Md, MD;  Location: [facility];  Service: Gastroenterology;  Laterality: N/A;  . EYE SURGERY Bilateral  lasic  . ORTHOPEDIC SURGERY     heel surgery  . PROSTATE BIOPSY    . TONSILLECTOMY      MEDICATIONS: . alfuzosin (UROXATRAL) 10 MG 24 hr tablet  . aspirin EC 81 MG tablet  . benazepril (LOTENSIN) 20 MG tablet  . Glucosamine-Chondroit-Vit C-Mn (GLUCOSAMINE CHONDR 1500 COMPLX) CAPS  . glucosamine-chondroitin 500-400 MG tablet  . hydrochlorothiazide (HYDRODIURIL) 25 MG tablet  . metFORMIN (GLUCOPHAGE-XR) 500 MG 24 hr tablet  . Multiple Vitamins-Minerals (MULTIVITAMIN ADULTS 50+ PO)  . omeprazole (PRILOSEC) 40 MG capsule  . traMADol  (ULTRAM) 50 MG tablet   No current facility-administered medications for this encounter.     Wynonia Musty [facility] Short Stay Center/Anesthesiology Phone [phone] 10/02/2017 4:05 PM

## 2017-10-09 ENCOUNTER — Inpatient Hospital Stay (HOSPITAL_COMMUNITY): Admission: RE | Admit: 2017-10-09 | Payer: PPO | Source: Ambulatory Visit | Admitting: Neurosurgery

## 2017-10-09 ENCOUNTER — Encounter (HOSPITAL_COMMUNITY): Admission: RE | Payer: Self-pay | Source: Ambulatory Visit

## 2017-10-09 SURGERY — ANTERIOR CERVICAL DECOMPRESSION/DISCECTOMY FUSION 3 LEVELS
Anesthesia: General

## 2017-11-17 DIAGNOSIS — R5383 Other fatigue: Secondary | ICD-10-CM | POA: Diagnosis not present

## 2017-11-17 DIAGNOSIS — E1165 Type 2 diabetes mellitus with hyperglycemia: Secondary | ICD-10-CM | POA: Diagnosis not present

## 2017-11-24 ENCOUNTER — Other Ambulatory Visit (HOSPITAL_COMMUNITY): Payer: Self-pay | Admitting: Nurse Practitioner

## 2017-11-24 ENCOUNTER — Ambulatory Visit (HOSPITAL_COMMUNITY)
Admission: RE | Admit: 2017-11-24 | Discharge: 2017-11-24 | Disposition: A | Discharge status: Home / Self Care | Payer: PPO | Source: Ambulatory Visit | Attending: Nurse Practitioner | Admitting: Nurse Practitioner

## 2017-11-24 DIAGNOSIS — R071 Chest pain on breathing: Secondary | ICD-10-CM | POA: Diagnosis not present

## 2017-11-24 DIAGNOSIS — N183 Chronic kidney disease, stage 3 (moderate): Secondary | ICD-10-CM | POA: Diagnosis not present

## 2017-11-24 DIAGNOSIS — E1165 Type 2 diabetes mellitus with hyperglycemia: Secondary | ICD-10-CM | POA: Diagnosis not present

## 2017-11-24 DIAGNOSIS — R0781 Pleurodynia: Secondary | ICD-10-CM | POA: Diagnosis not present

## 2017-11-24 DIAGNOSIS — R079 Chest pain, unspecified: Secondary | ICD-10-CM | POA: Diagnosis not present

## 2017-12-01 DIAGNOSIS — R079 Chest pain, unspecified: Secondary | ICD-10-CM | POA: Diagnosis not present

## 2018-01-15 DIAGNOSIS — K210 Gastro-esophageal reflux disease with esophagitis: Secondary | ICD-10-CM | POA: Diagnosis not present

## 2018-01-15 DIAGNOSIS — Z23 Encounter for immunization: Secondary | ICD-10-CM | POA: Diagnosis not present

## 2018-01-15 DIAGNOSIS — R202 Paresthesia of skin: Secondary | ICD-10-CM | POA: Diagnosis not present

## 2018-01-15 DIAGNOSIS — I10 Essential (primary) hypertension: Secondary | ICD-10-CM | POA: Diagnosis not present

## 2018-01-15 DIAGNOSIS — E1165 Type 2 diabetes mellitus with hyperglycemia: Secondary | ICD-10-CM | POA: Diagnosis not present

## 2018-01-19 DIAGNOSIS — G5601 Carpal tunnel syndrome, right upper limb: Secondary | ICD-10-CM | POA: Diagnosis not present

## 2018-01-19 DIAGNOSIS — M25511 Pain in right shoulder: Secondary | ICD-10-CM | POA: Diagnosis not present

## 2018-01-19 DIAGNOSIS — M75101 Unspecified rotator cuff tear or rupture of right shoulder, not specified as traumatic: Secondary | ICD-10-CM | POA: Diagnosis not present

## 2018-01-19 DIAGNOSIS — M5010 Cervical disc disorder with radiculopathy, unspecified cervical region: Secondary | ICD-10-CM | POA: Diagnosis not present

## 2018-01-19 DIAGNOSIS — G5621 Lesion of ulnar nerve, right upper limb: Secondary | ICD-10-CM | POA: Diagnosis not present

## 2018-01-20 DIAGNOSIS — N529 Male erectile dysfunction, unspecified: Secondary | ICD-10-CM | POA: Diagnosis not present

## 2018-01-20 DIAGNOSIS — F4321 Adjustment disorder with depressed mood: Secondary | ICD-10-CM | POA: Diagnosis not present

## 2018-01-20 DIAGNOSIS — M5010 Cervical disc disorder with radiculopathy, unspecified cervical region: Secondary | ICD-10-CM | POA: Diagnosis not present

## 2018-02-23 DIAGNOSIS — N183 Chronic kidney disease, stage 3 (moderate): Secondary | ICD-10-CM | POA: Diagnosis not present

## 2018-02-23 DIAGNOSIS — I10 Essential (primary) hypertension: Secondary | ICD-10-CM | POA: Diagnosis not present

## 2018-02-23 DIAGNOSIS — E1165 Type 2 diabetes mellitus with hyperglycemia: Secondary | ICD-10-CM | POA: Diagnosis not present

## 2018-02-23 DIAGNOSIS — K210 Gastro-esophageal reflux disease with esophagitis: Secondary | ICD-10-CM | POA: Diagnosis not present

## 2018-03-01 ENCOUNTER — Other Ambulatory Visit: Payer: Self-pay | Admitting: Radiology

## 2018-03-01 ENCOUNTER — Other Ambulatory Visit (HOSPITAL_COMMUNITY): Payer: Self-pay | Admitting: Neurosurgery

## 2018-03-01 ENCOUNTER — Other Ambulatory Visit: Payer: Self-pay | Admitting: Neurosurgery

## 2018-03-01 DIAGNOSIS — M4722 Other spondylosis with radiculopathy, cervical region: Secondary | ICD-10-CM

## 2018-03-08 ENCOUNTER — Ambulatory Visit (HOSPITAL_COMMUNITY)
Admission: RE | Admit: 2018-03-08 | Discharge: 2018-03-08 | Disposition: A | Discharge status: Home / Self Care | Payer: PPO | Source: Ambulatory Visit | Attending: Neurosurgery | Admitting: Neurosurgery

## 2018-03-08 ENCOUNTER — Ambulatory Visit (HOSPITAL_COMMUNITY)
Admission: RE | Admit: 2018-03-08 | Discharge: 2018-03-08 | Disposition: A | Discharge status: Home / Self Care | Payer: Worker's Compensation | Source: Ambulatory Visit | Attending: Neurosurgery | Admitting: Neurosurgery

## 2018-03-08 DIAGNOSIS — M48061 Spinal stenosis, lumbar region without neurogenic claudication: Secondary | ICD-10-CM | POA: Diagnosis not present

## 2018-03-08 DIAGNOSIS — M4722 Other spondylosis with radiculopathy, cervical region: Secondary | ICD-10-CM | POA: Insufficient documentation

## 2018-03-08 DIAGNOSIS — M4802 Spinal stenosis, cervical region: Secondary | ICD-10-CM | POA: Insufficient documentation

## 2018-03-08 LAB — GLUCOSE, CAPILLARY: Glucose-Capillary: 131 mg/dL — ABNORMAL HIGH (ref 70–99)

## 2018-03-08 MED ORDER — CHLOROPROCAINE HCL 1 % IJ SOLN
10.0000 mL | Freq: Once | INTRAMUSCULAR | Status: AC
  Administered 2018-03-08: 1 mL
  Filled 2018-03-08: qty 30

## 2018-03-08 MED ORDER — CHLOROPROCAINE HCL 1 % IJ SOLN
30.0000 mL | Freq: Once | INTRAMUSCULAR | Status: DC
  Filled 2018-03-08: qty 30

## 2018-03-08 MED ORDER — IOPAMIDOL (ISOVUE-M 300) INJECTION 61%
15.0000 mL | Freq: Once | INTRAMUSCULAR | Status: AC | PRN
  Administered 2018-03-08: 15 mL via INTRATHECAL

## 2018-03-08 MED ORDER — CHLOROPROCAINE HCL 1 % IJ SOLN
8.0000 mL | Freq: Once | INTRAMUSCULAR | Status: DC
  Filled 2018-03-08: qty 30

## 2018-03-08 MED ORDER — DIAZEPAM 5 MG PO TABS: 10.0000 mg | ORAL_TABLET | Freq: Once | ORAL | Status: DC

## 2018-03-08 NOTE — Discharge Instructions (Signed)
Hold Metformin 48 hours  Myelogram, Care After  These instructions give you information about caring for yourself after your procedure. Your doctor may also give you more specific instructions. Call your doctor if you have any problems or questions after your procedure. Follow these instructions at home:  Drink enough fluid to keep your pee (urine) clear or pale yellow.  Rest as told by your doctor.  Lie flat with your head slightly raised (elevated).  Do not bend, lift, or do any hard activities for 24-48 hours or as told by your doctor.  Take over-the-counter and prescription medicines only as told by your doctor.  Take care of and remove your bandage (dressing) as told by your doctor.  Bathe or shower as told by your doctor. Contact a health care provider if:  You have a fever.  You have a headache that lasts longer than 24 hours.  You feel sick to your stomach (nauseous).  You throw up (vomit).  Your neck is stiff.  Your legs feel numb.  You cannot pee.  You cannot poop (have a bowel movement).  You have a rash.  You are itchy or sneezing. Get help right away if:  You have new symptoms or your symptoms get worse.  You have a seizure.  You have trouble breathing. This information is not intended to replace advice given to you by your health care provider. Make sure you discuss any questions you have with your health care provider. Document Released: 10/30/2007 Document Revised: 09/20/2015 Document Reviewed: 11/02/2014 Elsevier Interactive Patient Education  2019 [employer].

## 2018-03-08 NOTE — Procedures (Signed)
Cervical myelogram performed via puncture at L3-4, injecting 10 mL of Isovue-M 300. Good subarachnoid opacification. CT pending. No immediate complication.

## 2018-03-17 DIAGNOSIS — N529 Male erectile dysfunction, unspecified: Secondary | ICD-10-CM | POA: Diagnosis not present

## 2018-03-17 DIAGNOSIS — R7989 Other specified abnormal findings of blood chemistry: Secondary | ICD-10-CM | POA: Diagnosis not present

## 2018-04-02 DIAGNOSIS — N529 Male erectile dysfunction, unspecified: Secondary | ICD-10-CM | POA: Diagnosis not present

## 2018-05-28 HISTORY — PX: ANTERIOR CERVICAL DECOMP/DISCECTOMY FUSION: SHX1161

## 2018-05-30 ENCOUNTER — Emergency Department (HOSPITAL_COMMUNITY): Payer: Medicare Other

## 2018-05-30 ENCOUNTER — Inpatient Hospital Stay (HOSPITAL_COMMUNITY)
Admission: EM | Admit: 2018-05-30 | Discharge: 2018-06-01 | DRG: 921 | Disposition: A | Discharge status: Home Health | Payer: Medicare Other | Attending: Family Medicine | Admitting: Family Medicine

## 2018-05-30 ENCOUNTER — Other Ambulatory Visit: Payer: Self-pay

## 2018-05-30 ENCOUNTER — Encounter (HOSPITAL_COMMUNITY): Payer: Self-pay | Admitting: Emergency Medicine

## 2018-05-30 DIAGNOSIS — R221 Localized swelling, mass and lump, neck: Secondary | ICD-10-CM | POA: Diagnosis present

## 2018-05-30 DIAGNOSIS — Z79899 Other long term (current) drug therapy: Secondary | ICD-10-CM

## 2018-05-30 DIAGNOSIS — Z7984 Long term (current) use of oral hypoglycemic drugs: Secondary | ICD-10-CM

## 2018-05-30 DIAGNOSIS — R1310 Dysphagia, unspecified: Secondary | ICD-10-CM

## 2018-05-30 DIAGNOSIS — Z981 Arthrodesis status: Secondary | ICD-10-CM

## 2018-05-30 DIAGNOSIS — N529 Male erectile dysfunction, unspecified: Secondary | ICD-10-CM | POA: Diagnosis present

## 2018-05-30 DIAGNOSIS — K219 Gastro-esophageal reflux disease without esophagitis: Secondary | ICD-10-CM | POA: Diagnosis present

## 2018-05-30 DIAGNOSIS — I129 Hypertensive chronic kidney disease with stage 1 through stage 4 chronic kidney disease, or unspecified chronic kidney disease: Secondary | ICD-10-CM | POA: Diagnosis present

## 2018-05-30 DIAGNOSIS — Z8349 Family history of other endocrine, nutritional and metabolic diseases: Secondary | ICD-10-CM

## 2018-05-30 DIAGNOSIS — R531 Weakness: Secondary | ICD-10-CM | POA: Diagnosis present

## 2018-05-30 DIAGNOSIS — N400 Enlarged prostate without lower urinary tract symptoms: Secondary | ICD-10-CM | POA: Diagnosis present

## 2018-05-30 DIAGNOSIS — E1122 Type 2 diabetes mellitus with diabetic chronic kidney disease: Secondary | ICD-10-CM | POA: Diagnosis present

## 2018-05-30 DIAGNOSIS — D869 Sarcoidosis, unspecified: Secondary | ICD-10-CM | POA: Diagnosis present

## 2018-05-30 DIAGNOSIS — Z87891 Personal history of nicotine dependence: Secondary | ICD-10-CM

## 2018-05-30 DIAGNOSIS — R066 Hiccough: Secondary | ICD-10-CM | POA: Diagnosis present

## 2018-05-30 DIAGNOSIS — Z888 Allergy status to other drugs, medicaments and biological substances status: Secondary | ICD-10-CM

## 2018-05-30 DIAGNOSIS — M4722 Other spondylosis with radiculopathy, cervical region: Secondary | ICD-10-CM | POA: Diagnosis present

## 2018-05-30 DIAGNOSIS — J95863 Postprocedural seroma of a respiratory system organ or structure following other procedure: Secondary | ICD-10-CM | POA: Diagnosis not present

## 2018-05-30 DIAGNOSIS — Z8701 Personal history of pneumonia (recurrent): Secondary | ICD-10-CM

## 2018-05-30 DIAGNOSIS — R51 Headache: Secondary | ICD-10-CM | POA: Diagnosis present

## 2018-05-30 DIAGNOSIS — R1313 Dysphagia, pharyngeal phase: Secondary | ICD-10-CM | POA: Diagnosis present

## 2018-05-30 DIAGNOSIS — K91873 Postprocedural seroma of a digestive system organ or structure following other procedure: Secondary | ICD-10-CM | POA: Diagnosis present

## 2018-05-30 DIAGNOSIS — N183 Chronic kidney disease, stage 3 (moderate): Secondary | ICD-10-CM | POA: Diagnosis present

## 2018-05-30 DIAGNOSIS — Z23 Encounter for immunization: Secondary | ICD-10-CM

## 2018-05-30 DIAGNOSIS — N1831 Chronic kidney disease, stage 3a: Secondary | ICD-10-CM | POA: Diagnosis present

## 2018-05-30 DIAGNOSIS — N184 Chronic kidney disease, stage 4 (severe): Secondary | ICD-10-CM | POA: Diagnosis present

## 2018-05-30 DIAGNOSIS — I10 Essential (primary) hypertension: Secondary | ICD-10-CM | POA: Diagnosis present

## 2018-05-30 DIAGNOSIS — R972 Elevated prostate specific antigen [PSA]: Secondary | ICD-10-CM | POA: Diagnosis present

## 2018-05-30 DIAGNOSIS — Z8249 Family history of ischemic heart disease and other diseases of the circulatory system: Secondary | ICD-10-CM

## 2018-05-30 DIAGNOSIS — Z833 Family history of diabetes mellitus: Secondary | ICD-10-CM

## 2018-05-30 DIAGNOSIS — G839 Paralytic syndrome, unspecified: Secondary | ICD-10-CM | POA: Diagnosis present

## 2018-05-30 HISTORY — DX: Benign prostatic hyperplasia without lower urinary tract symptoms: N40.0

## 2018-05-30 LAB — CBC WITH DIFFERENTIAL/PLATELET
Abs Immature Granulocytes: 0.06 10*3/uL (ref 0.00–0.07)
Basophils Absolute: 0 10*3/uL (ref 0.0–0.1)
Basophils Relative: 0 %
Eosinophils Absolute: 0 10*3/uL (ref 0.0–0.5)
Eosinophils Relative: 0 %
HCT: 39.2 % (ref 39.0–52.0)
Hemoglobin: 13.6 g/dL (ref 13.0–17.0)
Immature Granulocytes: 1 %
Lymphocytes Relative: 7 %
Lymphs Abs: 0.8 10*3/uL (ref 0.7–4.0)
MCH: 28.1 pg (ref 26.0–34.0)
MCHC: 34.7 g/dL (ref 30.0–36.0)
MCV: 81 fL (ref 80.0–100.0)
Monocytes Absolute: 0.9 10*3/uL (ref 0.1–1.0)
Monocytes Relative: 7 %
Neutro Abs: 10.9 10*3/uL — ABNORMAL HIGH (ref 1.7–7.7)
Neutrophils Relative %: 85 %
Platelets: 230 10*3/uL (ref 150–400)
RBC: 4.84 MIL/uL (ref 4.22–5.81)
RDW: 12.4 % (ref 11.5–15.5)
WBC: 12.6 10*3/uL — ABNORMAL HIGH (ref 4.0–10.5)
nRBC: 0 % (ref 0.0–0.2)

## 2018-05-30 LAB — COMPREHENSIVE METABOLIC PANEL
ALT: 14 U/L (ref 0–44)
AST: 24 U/L (ref 15–41)
Albumin: 3.6 g/dL (ref 3.5–5.0)
Alkaline Phosphatase: 64 U/L (ref 38–126)
Anion gap: 11 (ref 5–15)
BUN: 14 mg/dL (ref 8–23)
CO2: 27 mmol/L (ref 22–32)
Calcium: 9.2 mg/dL (ref 8.9–10.3)
Chloride: 97 mmol/L — ABNORMAL LOW (ref 98–111)
Creatinine, Ser: 1.76 mg/dL — ABNORMAL HIGH (ref 0.61–1.24)
GFR calc Af Amer: 46 mL/min — ABNORMAL LOW (ref >60)
GFR calc non Af Amer: 39 mL/min — ABNORMAL LOW (ref >60)
Glucose, Bld: 215 mg/dL — ABNORMAL HIGH (ref 70–99)
Potassium: 4.2 mmol/L (ref 3.5–5.1)
Sodium: 135 mmol/L (ref 135–145)
Total Bilirubin: 1.3 mg/dL — ABNORMAL HIGH (ref 0.3–1.2)
Total Protein: 7.4 g/dL (ref 6.5–8.1)

## 2018-05-30 LAB — TROPONIN I: Troponin I: <0.03 ng/mL (ref <0.03)

## 2018-05-30 LAB — D-DIMER, QUANTITATIVE: D-Dimer, Quant: 0.99 ug/mL-FEU — ABNORMAL HIGH (ref 0.00–0.50)

## 2018-05-30 MED ORDER — PANTOPRAZOLE SODIUM 40 MG IV SOLR
40.0000 mg | Freq: Every day | INTRAVENOUS | Status: DC
  Administered 2018-05-31: 23:00:00 40 mg via INTRAVENOUS
  Filled 2018-05-30: qty 40

## 2018-05-30 MED ORDER — SODIUM CHLORIDE 0.9 % IV SOLN
INTRAVENOUS | Status: DC
  Administered 2018-05-31: 01:00:00 via INTRAVENOUS

## 2018-05-30 MED ORDER — ONDANSETRON HCL 4 MG/2ML IJ SOLN
4.0000 mg | Freq: Once | INTRAMUSCULAR | Status: AC
  Administered 2018-05-30: 4 mg via INTRAVENOUS
  Filled 2018-05-30: qty 2

## 2018-05-30 MED ORDER — ONDANSETRON HCL 4 MG/2ML IJ SOLN: 4.0000 mg | Freq: Four times a day (QID) | INTRAMUSCULAR | Status: DC | PRN

## 2018-05-30 MED ORDER — MORPHINE SULFATE (PF) 4 MG/ML IV SOLN
4.0000 mg | Freq: Once | INTRAVENOUS | Status: AC
  Administered 2018-05-30: 19:00:00 4 mg via INTRAVENOUS
  Filled 2018-05-30: qty 1

## 2018-05-30 MED ORDER — SODIUM CHLORIDE 0.9 % IV BOLUS
500.0000 mL | Freq: Once | INTRAVENOUS | Status: AC
  Administered 2018-05-30: 19:00:00 500 mL via INTRAVENOUS

## 2018-05-30 MED ORDER — DEXAMETHASONE SODIUM PHOSPHATE 10 MG/ML IJ SOLN
10.0000 mg | Freq: Once | INTRAMUSCULAR | Status: AC
  Administered 2018-05-30: 10 mg via INTRAVENOUS
  Filled 2018-05-30: qty 1

## 2018-05-30 MED ORDER — HYDROMORPHONE HCL 1 MG/ML IJ SOLN
0.5000 mg | INTRAMUSCULAR | Status: DC | PRN
  Administered 2018-05-31 – 2018-06-01 (×4): 0.5 mg via INTRAVENOUS
  Filled 2018-05-30 (×4): qty 1

## 2018-05-30 MED ORDER — HYDRALAZINE HCL 20 MG/ML IJ SOLN
10.0000 mg | Freq: Three times a day (TID) | INTRAMUSCULAR | Status: DC
  Administered 2018-05-31 – 2018-06-01 (×5): 10 mg via INTRAVENOUS
  Filled 2018-05-30 (×5): qty 1

## 2018-05-30 MED ORDER — IOHEXOL 350 MG/ML SOLN
100.0000 mL | Freq: Once | INTRAVENOUS | Status: AC | PRN
  Administered 2018-05-30: 100 mL via INTRAVENOUS

## 2018-05-30 NOTE — ED Notes (Signed)
Informed Mallory - RN of pt's BP.

## 2018-05-30 NOTE — ED Notes (Signed)
Pt given orange juice to drink ?

## 2018-05-30 NOTE — ED Notes (Signed)
According to the pt, due to recent surgery his right arm has limited range of motion. According to the surgeon this is to be expected and range of motion will gradually return. PA notified

## 2018-05-30 NOTE — Progress Notes (Signed)
Discussed with patient earlier today via telephone, who is now in the ED. Today he started having some trouble swallowing, but denies any shortness of breath or change in his quality or volume of his voice. CT C-sp reviewed, post-op fluid collection present, likely a seroma given that he is POD2 and denies any swallowing difficulties on POD0 or POD1. Recommend admission to a telemetry bed with continuous pulse ox with intravenous dexamethasone 10mg  x1 followed by 1O1 W96E. If he develops any respirator symptoms, will take to the OR and evacuate the seroma, otherwise his dysphagia should improve with steroids while keeping him hydrated with IVF. Please call with any questions or concerns.  Judith Part, MD

## 2018-05-30 NOTE — ED Provider Notes (Signed)
[facility] EMERGENCY DEPARTMENT Provider Note   CSN: [id number] Arrival date & time: 05/30/18  1832  History   Chief Complaint Neck swelling  HPI ONIEL MELESKI is a 68 y.o. male with past medical history significant for diabetes (A1C 7.7), HTN, sarcoid, cervical spondylosis with radiculopathy s/p anterior cervical surgery who presents for evaluation of neck swelling and pain.  Patient states he sees Dr. Christella Noa with [place] [facility]. States he had surgery on Friday, 2 days PTA.  He has developed swelling to the anterior portion of his neck.  States he talked with Dr. Lacy Duverney office yesterday and they sentiment prednisone.  Patient states he took his first dose at 11 AM this morning.  Patient states he was able to eat scrambled eggs, however it was difficult to "get down."  Patient states he feels like he has to cough as he feels like he gets mucus stuck in the back of his throat.  He feels like he has not been able to try additional solid food as he is afraid this will get stuck in his throat.  Denies drooling, dysphasia or trismus.  Patient states he has not tried p.o. liquids.  Patient states he called Dr. Lacy Duverney office and was told to proceed to the emergency department for evaluation.  There was concern for possible dehydration.  Patient states he is also had upper chest wall pain since the surgery.  His overall pain a 7/10.  Pain does not radiate.  Described as a nagging sensation.  States he has been taking the pain medication which was prescribed to him after surgery, however he does not know the name of this.  Last took this morning.  Pain nonexertional.  No associated lightheadedness, dizziness, diaphoresis, SOB.  Denies prior cardiac or pulmonary disorders.  Denies fever, chills, nausea, vomiting, neck stiffness, neck rigidity, abdominal pain, diarrhea, dysuria.  Denies aggravating or alleviating factors.  Denies redness, warmth or drainage from his incision site.  History obtained from patient.  No interpreter was used.   PCP- [place] HPI  Past Medical History:  Diagnosis Date  . Abnormal CT of the chest 01/23/14  . Complication of anesthesia    hard to wake up   . Diabetes mellitus without complication ([place])   . Elevated PSA   . Erectile dysfunction   . Gastroesophageal reflux   . H/O sarcoidosis   . Headache    7-8 months from gabapentin and naproxen  . Hypertension   . Pneumonia of upper lobe of lung ([place]) 12/1213    Patient Active Problem List   Diagnosis Date Noted  . Post-operative wound abscess 05/30/2018  . Hilar adenopathy 06/13/2014  . H/O sarcoidosis   . Gastroesophageal reflux   . Hypertension   . Erectile dysfunction   . Pneumonia of upper lobe of lung ([place])   . Abnormal CT of the chest 01/23/2014    Past Surgical History:  Procedure Laterality Date  . COLONOSCOPY    . COLONOSCOPY N/A 06/23/2014   Procedure: COLONOSCOPY;  Surgeon: Aviva Signs Md, MD;  Location: [facility] ENDO SUITE;  Service: Gastroenterology;  Laterality: N/A;  . EYE SURGERY Bilateral    lasic  . ORTHOPEDIC SURGERY     heel surgery  . PROSTATE BIOPSY    . TONSILLECTOMY          Home Medications    Prior to Admission medications   Medication Sig Start Date End Date Taking? Authorizing Provider  alfuzosin (UROXATRAL) 10 MG 24  hr tablet Take 10 mg by mouth daily with breakfast.   Yes [provider]  aspirin EC 81 MG tablet Take 81 mg by mouth daily.   Yes [provider]  benazepril (LOTENSIN) 20 MG tablet Take 20 mg by mouth daily.   Yes [provider]  dexamethasone (DECADRON) 4 MG tablet Take 4 mg by mouth 2 (two) times daily with a meal.   Yes [provider]  Glucosamine-Chondroit-Vit C-Mn (GLUCOSAMINE CHONDR 1500 COMPLX) CAPS Take 2 capsules by mouth daily.   Yes [provider]  hydrochlorothiazide (HYDRODIURIL) 25 MG tablet Take 25 mg by mouth daily.    Yes [provider]  HYDROcodone-acetaminophen (NORCO/VICODIN) 5-325 MG tablet Take 1 tablet by mouth every 6 (six) hours as needed for moderate pain.   Yes [provider]  metFORMIN (GLUCOPHAGE-XR) 500 MG 24 hr tablet Take 500 mg by mouth 2 (two) times daily.  01/26/16  Yes [provider]  Multiple Vitamins-Minerals (MULTIVITAMIN ADULTS 50+ PO) Take 1 tablet by mouth daily.   Yes [provider]  omeprazole (PRILOSEC) 40 MG capsule Take 40 mg by mouth daily.   Yes [provider]  tiZANidine (ZANAFLEX) 4 MG tablet Take 4 mg by mouth every 8 (eight) hours as needed for muscle spasms.   Yes [provider]    Family History Family History  Problem Relation Age of Onset  . Cancer Mother   . Heart disease Father   . Hyperlipidemia Sister   . Diabetes Sister   . Heart disease Sister   . Hyperlipidemia Brother   . Diabetes Brother     Social History Social History   Tobacco Use  . Smoking status: Former Smoker    Types: Cigars  . Smokeless tobacco: Never Used  Substance Use Topics  . Alcohol use: No  . Drug use: No     Allergies   Diclofenac sodium; Levaquin [levofloxacin in d5w]; and Lidocaine   Review of Systems Review of Systems  Constitutional: Negative.   HENT: Positive for sore throat and trouble swallowing. Negative for congestion, dental problem, drooling, facial swelling, postnasal drip, rhinorrhea, sinus pressure, sinus pain, tinnitus and voice change.   Respiratory: Negative.   Cardiovascular: Positive for chest pain. Negative for palpitations and leg swelling.  Genitourinary: Negative.   Musculoskeletal: Positive for neck pain. Negative for neck stiffness.       Neck swelling  Skin: Negative.   Neurological: Negative.   All other systems reviewed and are negative.    Physical Exam Updated Vital Signs BP (!) 132/92   Pulse 82   Temp 99.1 F (37.3 C) (Rectal)   Resp 18   Ht 5\' 10"  (1.778 m)   Wt 108.9 kg    SpO2 91%   BMI 34.44 kg/m   Physical Exam Vitals signs and nursing note reviewed.  Constitutional:      General: He is not in acute distress.    Appearance: He is well-developed. He is not diaphoretic.  HENT:     Head: Normocephalic and atraumatic.     Nose: Nose normal.     Mouth/Throat:     Comments: Mucous membranes moist.  Posterior oropharynx clear. Tonsils not visualized. No drooling, dysphasia or trismus.  No pooling of secretions. Eyes:     Pupils: Pupils are equal, round, and reactive to light.  Neck:     Musculoskeletal: Full passive range of motion without pain, normal range of motion and neck supple.  Trachea: Trachea and phonation normal.      Comments: Surgical scar to left anterior neck without erythema, warmth.  No drainage.  Mildly tender to palpation. Cardiovascular:     Rate and Rhythm: Normal rate and regular rhythm.  Pulmonary:     Effort: Pulmonary effort is normal. No respiratory distress.     Comments: Clear to auscultation by without wheeze, rhonchi or rales.  No stridor.  Speaks in full sentences without difficulty. Abdominal:     General: There is no distension.     Palpations: Abdomen is soft.     Comments: Soft, Nontender without rebound or guarding.  Musculoskeletal: Normal range of motion.     Comments: Moves all 4 extremities without difficulty.  No lower extremity edema, erythema, ecchymosis or warmth.  Nontender bilateral calves.  Lymphadenopathy:     Cervical: No cervical adenopathy.  Skin:    General: Skin is warm and dry.     Comments: Brisk capillary refill.  Surgical scar to left anterior neck without signs of infection.  Neurological:     Mental Status: He is alert.     Comments: No facial droop.  Cranial nerves II through XII grossly intact Patient ambulatory in department without difficulty.      ED Treatments / Results  Labs (all labs ordered are listed, but only abnormal results are displayed) Labs Reviewed  CBC WITH  DIFFERENTIAL/PLATELET - Abnormal; Notable for the following components:      Result Value   WBC 12.6 (*)    Neutro Abs 10.9 (*)    All other components within normal limits  COMPREHENSIVE METABOLIC PANEL - Abnormal; Notable for the following components:   Chloride 97 (*)    Glucose, Bld 215 (*)    Creatinine, Ser 1.76 (*)    Total Bilirubin 1.3 (*)    GFR calc non Af Amer 39 (*)    GFR calc Af Amer 46 (*)    All other components within normal limits  D-DIMER, QUANTITATIVE (NOT AT [facility]) - Abnormal; Notable for the following components:   D-Dimer, Quant 0.99 (*)    All other components within normal limits  TROPONIN I    EKG EKG Interpretation  Date/Time:  Sunday May 30 2018 18:44:51 EDT Ventricular Rate:  98 PR Interval:    QRS Duration: 103 QT Interval:  341 QTC Calculation: 436 R Axis:   -43 Text Interpretation:  Sinus rhythm Nonspecific T wave abnormality Confirmed by Lajean Saver [phone]) on 05/30/2018 6:50:57 PM   Radiology Ct Soft Tissue Neck W Contrast  Result Date: 05/30/2018 CLINICAL DATA:  Initial evaluation for acute neck swelling, difficulty swallowing. Recent neck surgery. EXAM: CT NECK WITH CONTRAST TECHNIQUE: Multidetector CT imaging of the neck was performed using the standard protocol following the bolus administration of intravenous contrast. CONTRAST:  183mL OMNIPAQUE IOHEXOL 350 MG/ML SOLN COMPARISON:  Prior CT from 03/08/2018. FINDINGS: Pharynx and larynx: Oral cavity within normal limits without discrete mass or loculated collection. No acute abnormality about the dentition. Oropharynx and nasopharynx within normal limits. Epiglottis normal. Vallecula clear. Layering effusion seen within the retropharyngeal space with associated prevertebral edema, extending from the level of C1-2 inferiorly to approximately C7. Findings likely postoperative in nature, as the patient appears to be status post recent ACDF at C4-C7. Stranding and edema extends laterally to  partially involve the carotid spaces as well. Superimposed partially loculated collection anterior to the ACDF measures approximately 1.7 x 4.6 x 3.3 cm (series 12, image 90). Collection extends anteriorly  into the left neck along the surgical approach. Few small foci of gas noted within this collection as well as within the left neck, likely postoperative. Associated mucosal edema within the hypopharynx. Mass effect on the supraglottic airway which is narrowed and attenuated, measuring 3 mm in AP diameter at its most narrow point. Piriform sinuses are largely effaced. Glottis is largely closed and not well assessed. Subglottic airway bowed to the right but widely patent and clear. Salivary glands: Salivary glands including the parotid and submandibular glands are within normal limits. Hazy postsurgical stranding partially surrounds the left submandibular gland within the left submandibular space. Thyroid: Thyroid within normal limits. Lymph nodes: No pathologically enlarged lymph nodes identified within the neck. Vascular: Normal intravascular enhancement seen throughout the neck. Limited intracranial: Unremarkable. Visualized orbits: Unremarkable. Mastoids and visualized paranasal sinuses: Scattered mucoperiosteal thickening seen throughout the ethmoidal air cells, sphenoid sinuses, and maxillary sinuses. Few scattered superimposed small retention cyst noted. No air-fluid levels to suggest acute sinusitis. Moderate bilateral mastoid effusions. Middle ear cavities remain pneumatized. Skeleton: Sequelae of recent ACDF at C4 through C7. Hardware appears well positioned and intact. No adverse features. No acute osseous abnormality. No discrete lytic or blastic osseous lesions. Upper chest: Better evaluated on concomitant CT of the chest. Other: None. IMPRESSION: 1. Postoperative changes from recent ACDF at C4-C7. Associated retropharyngeal effusion with prevertebral edema compatible with postoperative changes.  Superimposed partially loculated collection measuring up to 4.6 cm anterior to the ACDF with extension into the left neck along the surgical approach, likely reflecting postoperative seroma, although superimposed infection could be considered in the correct clinical setting. Secondary mass effect on the supraglottic airway which is narrowed and attenuated but remains patent at this time. Close clinical monitoring to insure adequate airway patency recommended. 2. Moderate bilateral mastoid effusions, indeterminate. Correlation with physical exam and symptomatology recommended. Electronically Signed   By: Jeannine Boga M.D.   On: 05/30/2018 21:29   Ct Angio Chest Pe W/cm &/or Wo Cm  Result Date: 05/30/2018 CLINICAL DATA:  Recent neck surgery shortness of breath positive D-dimer EXAM: CT ANGIOGRAPHY CHEST WITH CONTRAST TECHNIQUE: Multidetector CT imaging of the chest was performed using the standard protocol during bolus administration of intravenous contrast. Multiplanar CT image reconstructions and MIPs were obtained to evaluate the vascular anatomy. CONTRAST:  127mL OMNIPAQUE IOHEXOL 350 MG/ML SOLN COMPARISON:  CT 02/04/2017 FINDINGS: Cardiovascular: Suboptimal opacification of subsegmental branch vessels in the lower lobes. No acute central pulmonary embolus is seen. Nonaneurysmal aorta. Heart size within normal limits. No pericardial effusion Mediastinum/Nodes: Midline trachea. No thyroid mass. Edema within the anterior neck soft tissues at the level of the thyroid with few scattered gas bubbles. Esophagus slightly air distended. Mild mediastinal and hilar nodes. Lungs/Pleura: No acute consolidation or effusion.  No pneumothorax. Upper Abdomen: Punctate nonobstructing stone upper pole right kidney. 5 cm hypodense mass mid pole right kidney, likely a cyst. Musculoskeletal: No acute or suspicious abnormality. Degenerative osteophytes of the spine. Review of the MIP images confirms the above findings.  IMPRESSION: 1. Suboptimal opacification of subsegmental branch vessels in the lower lobes limits evaluation. No acute central pulmonary embolus is visualized. 2. Mild edema within the anterior neck soft tissues at the level of thyroid, which may be secondary to history of recent surgery 3. Clear lung fields 4. Scattered mediastinal and hilar lymph nodes, patient has history of sarcoidosis 5. Punctate nonobstructing stone upper pole right kidney Electronically Signed   By: Donavan Foil M.D.   On: 05/30/2018 21:25  Procedures Procedures (including critical care time)  Medications Ordered in ED Medications  HYDROmorphone (DILAUDID) injection 0.5 mg (has no administration in time range)  0.9 %  sodium chloride infusion (has no administration in time range)  hydrALAZINE (APRESOLINE) injection 10 mg (has no administration in time range)  pantoprazole (PROTONIX) injection 40 mg (has no administration in time range)  ondansetron (ZOFRAN) injection 4 mg (has no administration in time range)  sodium chloride 0.9 % bolus 500 mL (0 mLs Intravenous Stopped 05/30/18 2307)  morphine 4 MG/ML injection 4 mg (4 mg Intravenous Given 05/30/18 1920)  ondansetron (ZOFRAN) injection 4 mg (4 mg Intravenous Given 05/30/18 1919)  dexamethasone (DECADRON) injection 10 mg (10 mg Intravenous Given 05/30/18 2022)  iohexol (OMNIPAQUE) 350 MG/ML injection 100 mL (100 mLs Intravenous Contrast Given 05/30/18 2040)   Initial Impression / Assessment and Plan / ED Course  I have reviewed the triage vital signs and the nursing notes.  Pertinent labs & imaging results that were available during my care of the patient were reviewed by me and considered in my medical decision making (see chart for details).  68 year old male peers otherwise well presents for evaluation of neck swelling.  Recent anterior approach cervical spine surgery on Friday, 2 days PTA.  Developed swelling to the surgical site as well as difficulty swallowing  yesterday.  Prescribed prednisone by Dr. Cyndy Freeze, [place] [facility]. Was able to eat eggs this morning, however has had increased difficulty with swallowing since.  Feels like he has mucus stuck in the back of his throat and he has to cough it up. No drooling, dysphasia or trismus. No neck stiffness or rigidity. He denies warmth, redness or drainage from his surgical site.  Will obtain labs, imaging and reevaluate.  Likely post-op swelling.  Low suspicion for sepsis or bacteremia.  Airway without stridor.  Lungs clear without wheeze, rhonchi or rales.  No phonation changes.  No signs of acute respiratory distress or airway compromise.  Chest pain however not exertional nature, no shortness of breath, radiation of pain, diaphoresis, nausea vomiting or lightheadedness.  Heart score-3 (Age, Risk factors) Wells criteria Moderate risk. (Tachycardia and recent surgery) Will ddimer  CBC with mild leukocytosis at 24.4, metabolic panel with creatinine 1.76 at baseline, glycemia, consistent with history of diabetes, troponin negative, d-dimer elevated, CT scan negative for PE.  CT soft tissue neck with partially loculated collection likely reflecting postop seroma although cannot rule out superimposed infection.  There is secondary mass-effect on supraglottic airway however airway remains patent.  No stridor or difficulty breathing on exam.  He has no tachycardia, tachypnea or hypoxia.  He is afebrile.  I have low suspicion for infectious process, bacteremia or sepsis. Will consult with Neurosurgery and reevaluate.  2145: Consulted with Dr. Venetia Constable, Neurosurgery. Recommends medicine admission, 10 of Decadron and he will evaluate in morning.  Consulted with Dr. Maudie Mercury with Family Medicine teaching service who agrees to evaluate patient for admission. Patient with patent airway and is hemodynamically stable at this time. Tolerating PO intake with small sips in ED.     Final Clinical Impressions(s) / ED Diagnoses    Final diagnoses:  Postoperative seroma involving respiratory system after non-respiratory system procedure    ED Discharge Orders    None       Henderly, Britni A, PA-C 05/30/18 2358    Lajean Saver, MD 05/31/18 [phone]

## 2018-05-30 NOTE — ED Notes (Signed)
Patient transported to CT 

## 2018-05-30 NOTE — ED Notes (Signed)
ED TO INPATIENT HANDOFF REPORT  ED Nurse Name and Phone #: Anderson Malta RN  S Name/Age/Gender Denese Killings Kempker 68 y.o. male Room/Bed: 028C/028C  Code Status   Code Status: Prior  Home/SNF/Other Home Patient oriented to: self, place, time and situation Is this baseline? Yes   Triage Complete: Triage complete  Chief Complaint neck swelling  Triage Note Pt had recent neck surgery on Friday. Pt here for neck swelling. Pt states he is having trouble swallowing.    Allergies Allergies  Allergen Reactions  . Diclofenac Sodium Anaphylaxis    Topical gel Topical gel  . Levaquin [Levofloxacin In D5w] Anaphylaxis  . Lidocaine Anaphylaxis    Level of Care/Admitting Diagnosis ED Disposition    ED Disposition Condition [facility] Hospital Area: [place] [[id number]]  Level of Care: Telemetry Cardiac [103]  Covid Evaluation: N/A  Diagnosis: Post-operative wound abscess [347425]  Admitting Physician: Wilber Oliphant [[id number]]  Attending Physician: MCDIARMID, TODD D [[id number]]  PT Class (Do Not Modify): Observation [104]  PT Acc Code (Do Not Modify): Observation [10022]       B Medical/Surgery History Past Medical History:  Diagnosis Date  . Abnormal CT of the chest 01/23/14  . Complication of anesthesia    hard to wake up   . Diabetes mellitus without complication ([place])   . Elevated PSA   . Erectile dysfunction   . Gastroesophageal reflux   . H/O sarcoidosis   . Headache    7-8 months from gabapentin and naproxen  . Hypertension   . Pneumonia of upper lobe of lung ([place]) 12/1213   Past Surgical History:  Procedure Laterality Date  . COLONOSCOPY    . COLONOSCOPY N/A 06/23/2014   Procedure: COLONOSCOPY;  Surgeon: Aviva Signs Md, MD;  Location: [facility];  Service: Gastroenterology;  Laterality: N/A;  . EYE SURGERY Bilateral    lasic  . ORTHOPEDIC SURGERY     heel surgery  . PROSTATE BIOPSY    . TONSILLECTOMY       A IV  Location/Drains/Wounds Patient Lines/Drains/Airways Status   Active Line/Drains/Airways    Name:   Placement date:   Placement time:   Site:   Days:   Peripheral IV 05/30/18 Left Antecubital   05/30/18    1919    Antecubital   less than 1          Intake/Output Last 24 hours No intake or output data in the 24 hours ending 05/30/18 2335  Labs/Imaging Results for orders placed or performed during the hospital encounter of 05/30/18 (from the past 48 hour(s))  D-dimer, quantitative (not at [facility])     Status: Abnormal   Collection Time: 05/30/18  7:08 PM  Result Value Ref Range   D-Dimer, Quant 0.99 (H) 0.00 - 0.50 ug/mL-FEU    Comment: (NOTE) At the manufacturer cut-off of 0.50 ug/mL FEU, this assay has been documented to exclude PE with a sensitivity and negative predictive value of 97 to 99%.  At this time, this assay has not been approved by the FDA to exclude DVT/VTE. Results should be correlated with clinical presentation. Performed at [facility], [place] [address] [place], [place] [address]   CBC with Differential     Status: Abnormal   Collection Time: 05/30/18  7:30 PM  Result Value Ref Range   WBC 12.6 (H) 4.0 - 10.5 K/uL   RBC 4.84 4.22 - 5.81 MIL/uL   Hemoglobin 13.6 13.0 - 17.0 g/dL  HCT 39.2 39.0 - 52.0 %   MCV 81.0 80.0 - 100.0 fL   MCH 28.1 26.0 - 34.0 pg   MCHC 34.7 30.0 - 36.0 g/dL   RDW 12.4 11.5 - 15.5 %   Platelets 230 150 - 400 K/uL   nRBC 0.0 0.0 - 0.2 %   Neutrophils Relative % 85 %   Neutro Abs 10.9 (H) 1.7 - 7.7 K/uL   Lymphocytes Relative 7 %   Lymphs Abs 0.8 0.7 - 4.0 K/uL   Monocytes Relative 7 %   Monocytes Absolute 0.9 0.1 - 1.0 K/uL   Eosinophils Relative 0 %   Eosinophils Absolute 0.0 0.0 - 0.5 K/uL   Basophils Relative 0 %   Basophils Absolute 0.0 0.0 - 0.1 K/uL   Immature Granulocytes 1 %   Abs Immature Granulocytes 0.06 0.00 - 0.07 K/uL    Comment: Performed at [facility] [address] [place], [place] [address]   Comprehensive metabolic panel     Status: Abnormal   Collection Time: 05/30/18  7:30 PM  Result Value Ref Range   Sodium 135 135 - 145 mmol/L   Potassium 4.2 3.5 - 5.1 mmol/L   Chloride 97 (L) 98 - 111 mmol/L   CO2 27 22 - 32 mmol/L   Glucose, Bld 215 (H) 70 - 99 mg/dL   BUN 14 8 - 23 mg/dL   Creatinine, Ser 1.76 (H) 0.61 - 1.24 mg/dL   Calcium 9.2 8.9 - 10.3 mg/dL   Total Protein 7.4 6.5 - 8.1 g/dL   Albumin 3.6 3.5 - 5.0 g/dL   AST 24 15 - 41 U/L   ALT 14 0 - 44 U/L   Alkaline Phosphatase 64 38 - 126 U/L   Total Bilirubin 1.3 (H) 0.3 - 1.2 mg/dL   GFR calc non Af Amer 39 (L) >60 mL/min   GFR calc Af Amer 46 (L) >60 mL/min   Anion gap 11 5 - 15    Comment: Performed at [facility] [address] [place], [place] [address]  Troponin I - ONCE - STAT     Status: None   Collection Time: 05/30/18  7:30 PM  Result Value Ref Range   Troponin I <0.03 <0.03 ng/mL    Comment: Performed at [facility] [address], [place],  [address]   Ct Soft Tissue Neck W Contrast  Result Date: 05/30/2018 CLINICAL DATA:  Initial evaluation for acute neck swelling, difficulty swallowing. Recent neck surgery. EXAM: CT NECK WITH CONTRAST TECHNIQUE: Multidetector CT imaging of the neck was performed using the standard protocol following the bolus administration of intravenous contrast. CONTRAST:  177mL OMNIPAQUE IOHEXOL 350 MG/ML SOLN COMPARISON:  Prior CT from 03/08/2018. FINDINGS: Pharynx and larynx: Oral cavity within normal limits without discrete mass or loculated collection. No acute abnormality about the dentition. Oropharynx and nasopharynx within normal limits. Epiglottis normal. Vallecula clear. Layering effusion seen within the retropharyngeal space with associated prevertebral edema, extending from the level of C1-2 inferiorly to approximately C7. Findings likely postoperative in nature, as the patient appears to be status post recent ACDF at C4-C7. Stranding and edema  extends laterally to partially involve the carotid spaces as well. Superimposed partially loculated collection anterior to the ACDF measures approximately 1.7 x 4.6 x 3.3 cm (series 12, image 90). Collection extends anteriorly into the left neck along the surgical approach. Few small foci of gas noted within this collection as well as within the left neck,  likely postoperative. Associated mucosal edema within the hypopharynx. Mass effect on the supraglottic airway which is narrowed and attenuated, measuring 3 mm in AP diameter at its most narrow point. Piriform sinuses are largely effaced. Glottis is largely closed and not well assessed. Subglottic airway bowed to the right but widely patent and clear. Salivary glands: Salivary glands including the parotid and submandibular glands are within normal limits. Hazy postsurgical stranding partially surrounds the left submandibular gland within the left submandibular space. Thyroid: Thyroid within normal limits. Lymph nodes: No pathologically enlarged lymph nodes identified within the neck. Vascular: Normal intravascular enhancement seen throughout the neck. Limited intracranial: Unremarkable. Visualized orbits: Unremarkable. Mastoids and visualized paranasal sinuses: Scattered mucoperiosteal thickening seen throughout the ethmoidal air cells, sphenoid sinuses, and maxillary sinuses. Few scattered superimposed small retention cyst noted. No air-fluid levels to suggest acute sinusitis. Moderate bilateral mastoid effusions. Middle ear cavities remain pneumatized. Skeleton: Sequelae of recent ACDF at C4 through C7. Hardware appears well positioned and intact. No adverse features. No acute osseous abnormality. No discrete lytic or blastic osseous lesions. Upper chest: Better evaluated on concomitant CT of the chest. Other: None. IMPRESSION: 1. Postoperative changes from recent ACDF at C4-C7. Associated retropharyngeal effusion with prevertebral edema compatible with  postoperative changes. Superimposed partially loculated collection measuring up to 4.6 cm anterior to the ACDF with extension into the left neck along the surgical approach, likely reflecting postoperative seroma, although superimposed infection could be considered in the correct clinical setting. Secondary mass effect on the supraglottic airway which is narrowed and attenuated but remains patent at this time. Close clinical monitoring to insure adequate airway patency recommended. 2. Moderate bilateral mastoid effusions, indeterminate. Correlation with physical exam and symptomatology recommended. Electronically Signed   By: Jeannine Boga M.D.   On: 05/30/2018 21:29   Ct Angio Chest Pe W/cm &/or Wo Cm  Result Date: 05/30/2018 CLINICAL DATA:  Recent neck surgery shortness of breath positive D-dimer EXAM: CT ANGIOGRAPHY CHEST WITH CONTRAST TECHNIQUE: Multidetector CT imaging of the chest was performed using the standard protocol during bolus administration of intravenous contrast. Multiplanar CT image reconstructions and MIPs were obtained to evaluate the vascular anatomy. CONTRAST:  149mL OMNIPAQUE IOHEXOL 350 MG/ML SOLN COMPARISON:  CT 02/04/2017 FINDINGS: Cardiovascular: Suboptimal opacification of subsegmental branch vessels in the lower lobes. No acute central pulmonary embolus is seen. Nonaneurysmal aorta. Heart size within normal limits. No pericardial effusion Mediastinum/Nodes: Midline trachea. No thyroid mass. Edema within the anterior neck soft tissues at the level of the thyroid with few scattered gas bubbles. Esophagus slightly air distended. Mild mediastinal and hilar nodes. Lungs/Pleura: No acute consolidation or effusion.  No pneumothorax. Upper Abdomen: Punctate nonobstructing stone upper pole right kidney. 5 cm hypodense mass mid pole right kidney, likely a cyst. Musculoskeletal: No acute or suspicious abnormality. Degenerative osteophytes of the spine. Review of the MIP images confirms  the above findings. IMPRESSION: 1. Suboptimal opacification of subsegmental branch vessels in the lower lobes limits evaluation. No acute central pulmonary embolus is visualized. 2. Mild edema within the anterior neck soft tissues at the level of thyroid, which may be secondary to history of recent surgery 3. Clear lung fields 4. Scattered mediastinal and hilar lymph nodes, patient has history of sarcoidosis 5. Punctate nonobstructing stone upper pole right kidney Electronically Signed   By: Donavan Foil M.D.   On: 05/30/2018 21:25    Pending Labs [employer] (From admission, onward)    Start     Ordered   Signed and Held  HIV antibody (Routine Testing)  Once,   R     Signed and Held   Signed and Held  Basic metabolic panel  Tomorrow morning,   R     Signed and Held   Signed and Held  CBC  Tomorrow morning,   R     Signed and Held          Vitals/Pain Today's Vitals   05/30/18 2145 05/30/18 2200 05/30/18 2330 05/30/18 2335  BP: (!) 165/107 (!) 162/118 (!) 132/92   Pulse: 97 86 82   Resp: (!) 23 16 18    Temp:      TempSrc:      SpO2: 92% 95% 91%   Weight:      Height:      PainSc:    7     Isolation Precautions No active isolations  Medications Medications  sodium chloride 0.9 % bolus 500 mL (0 mLs Intravenous Stopped 05/30/18 2307)  morphine 4 MG/ML injection 4 mg (4 mg Intravenous Given 05/30/18 1920)  ondansetron (ZOFRAN) injection 4 mg (4 mg Intravenous Given 05/30/18 1919)  dexamethasone (DECADRON) injection 10 mg (10 mg Intravenous Given 05/30/18 2022)  iohexol (OMNIPAQUE) 350 MG/ML injection 100 mL (100 mLs Intravenous Contrast Given 05/30/18 2040)    Mobility walks Low fall risk   Focused Assessments Cardiac Assessment Handoff:    Lab Results  Component Value Date   TROPONINI <0.03 05/30/2018   Lab Results  Component Value Date   DDIMER 0.99 (H) 05/30/2018   Does the Patient currently have chest pain? No  , Neuro Assessment Handoff:  Swallow screen  pass? No          Neuro Assessment:   Neuro Checks:      Last Documented NIHSS Modified Score:   Has TPA been given? No If patient is a Neuro Trauma and patient is going to OR before floor call report to [place] nurse: [phone] or [phone]     R Recommendations: See Admitting Provider Note  Report given to:   Additional Notes: Pt alert and oriented, able to speak in complete sentences

## 2018-05-30 NOTE — H&P (Addendum)
[facility] Admission History and Physical Service Pager: [phone]  Patient name: Thomas Malone Medical record number: [id number] Date of birth: [birth year] Age: 68 y.o. 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   His chronic conditions include type 2 diabetes, hypertension, CKD 3, GERD, BPH, history of sarcoidosis.  # Difficulty swallowing Patient reports difficulty swallowing that started this morning upon waking up.  Last meal was 2 eggs at breakfast.  He denies any difficulty breathing at this time.  Neurosurgery was consulted in the ED and recommended admission for evaluation of possible seroma.  On CT scan, there is possible superimposed infection along the surgical site.  Additionally, white count elevated at 12.6 and ANC elevated at 10.2.  Will hold on antibiotics at this time.  White count elevation could also be from being on steroids and being postop day 2.  Will monitor for fevers and monitor CBC.  . Admit to FMTS, attending Dr. McDiarmid. Level of care: Tele, Continuous cardiac monitoring .  Marland Kitchen Vitals per unit routine . Continuous pulse ox . Gentle fluids in setting of HTN, 75 mL/h . Strict I/O  . Suction to wall as needed oral secretions . IV zofran PRN nausea . Neurosurgery recs  o [phone], next dose 0600 . AM CBC w/ diff  S/P Anterior Cervical Fusions  #Right sided weakness & pain   PT/OT daily for RUE exercise   IV Dilaudid PRN pain (CKD III)  # HTN At home, on Benazepril and HCTZ.  Patient's blood pressures elevated to 160s in the ED.   As patient is n.p.o., will start IV hydralazine 3 times daily.  Option to add IV beta-blocker twice daily as needed.  IV hydralazine 10 mg 3 times daily, titrate as needed  Monitor blood pressures  # CKD III  Creatinine 1.72 which is at baseline.  GFR also at baseline.  Avoid nephrotoxic medications  Trend BMP / urinary output Replace electrolytes as indicated Ensure adequate renal perfusion  # DMII  At home, metformin 500mg  BID.  Most recent A1c 7.7 in August 2019.  Will need outpatient follow-up for chronic disease management  CBG checks AC, qhs  # GERD At home, takes omeprazole 40 mg every other day.  We will continue IV PPI while inpatient.  IV PPI daily in setting of NPO   #BPH At home, on uroxatral 10mg  daily.  Hold home medication in setting of n.p.o.  Strict IO in setting of fluid resuscitation  Bladder scan if no output x12 hours  # Hx of Sarcoidosis, chronic, stable CT chest shows: scattered mediastinal and hilar lymph nodes, patient has history of sarcoidosis  #FEN/GI:  . Fluids: 75 mL/hr NS  . Electrolytes: wnl  . Nutrition:  NPO  Access: PIV VTE prophylaxis: Heparin Q 8hrs   Disposition: Admit to telemetry.  ============================================================================= HPI Thomas Malone is a 68 y.o. male with past medical history significant for DM II, h/o sarcoidosis, GERD, HTN, CKDIII and cervical spondylosis w/ radiculopathy, who presents with trouble swallowing. He had surgery on Friday for an anterior cervical fusion of C3-7. Patient  reports that first thing this morning, he woke up and felt as if he was having trouble swallowing and things would get stuck in his throat. He would then have to cough it up. He has not been able to eat or drink since this morning. States he is not having any trouble breathing.   No difficulty breathing, but does have some dysphagia.   In the ED, given 10mg  decadron per direction of Dr. Arneta Cliche and  588mL NS bolus x 2 Abnormal Labs Reviewed  CBC WITH DIFFERENTIAL/PLATELET - Abnormal; Notable for the following components:      Result Value   WBC 12.6 (*)    Neutro Abs 10.9 (*)    All other components within normal limits  COMPREHENSIVE METABOLIC PANEL - Abnormal; Notable for the following components:   Chloride 97 (*)    Glucose, Bld 215 (*)    Creatinine, Ser 1.76 (*)    Total Bilirubin 1.3 (*)    GFR calc non Af Amer 39 (*)    GFR calc Af Amer 46 (*)    All other components within normal limits  D-DIMER, QUANTITATIVE (NOT AT [employer]) - Abnormal; Notable for the following components:   D-Dimer, Quant 0.99 (*)    All other components within normal limits    Review Of Systems: Review of Systems  Constitutional: Negative for chills and fever.  HENT: Negative for congestion and sore throat.        Trouble swallowing  Eyes: Negative for blurred vision and double vision.  Respiratory: Positive for sputum production. Negative for shortness of breath.   Cardiovascular: Positive for chest pain and leg swelling.       Chest pain only with cough or swallowing  Gastrointestinal: Negative for abdominal pain, constipation, diarrhea, nausea and vomiting.  Genitourinary: Negative for dysuria.  Musculoskeletal: Positive for neck pain.  Neurological: Positive for headaches. Negative for dizziness.       Tinnitus    Patient Active Problem List   Diagnosis Date Noted  . Hilar adenopathy 06/13/2014  . H/O sarcoidosis   . Gastroesophageal reflux   . Hypertension   . Erectile dysfunction   . Pneumonia of upper lobe of lung ([place])   . Abnormal CT of the chest 01/23/2014   Past Medical History: Past Medical History:  Diagnosis Date  . Abnormal CT of the chest 01/23/14  . Complication of anesthesia    hard to wake up   . Diabetes mellitus without complication ([place])   . Elevated PSA   . Erectile dysfunction   . Gastroesophageal reflux   . H/O sarcoidosis   . Headache    7-8 months from  gabapentin and naproxen  . Hypertension   . Pneumonia of upper lobe of lung ([place]) 12/1213   Past Surgical History: Past Surgical History:  Procedure Laterality Date  . COLONOSCOPY    . COLONOSCOPY N/A 06/23/2014   Procedure: COLONOSCOPY;  Surgeon: Aviva Signs Md, MD;  Location[facility];  Service: Gastroenterology;  Laterality: N/A;  . EYE SURGERY Bilateral    lasic  . ORTHOPEDIC SURGERY     heel surgery  . PROSTATE BIOPSY    . TONSILLECTOMY     Family History: family history includes Cancer in his mother; Diabetes in his brother and sister; Heart disease in his father and sister; Hyperlipidemia in his brother and sister.   Social History:  Johnchristopher reports that he has quit smoking. His smoking use included cigars. He has never used smokeless tobacco. He  reports that he does not drink alcohol or use drugs. Patient used to smoke cigars 20-30 years ago and stopped when diagnosed with sarcoidosis. Denies alcohol. Denies any illicit drug use.   Allergies and Medications: Allergies  Allergen Reactions  . Diclofenac Sodium Anaphylaxis    Topical gel Topical gel  . Levaquin [Levofloxacin In D5w] Anaphylaxis  . Lidocaine Anaphylaxis   Objective: BP (!) 165/107   Pulse 97   Temp 99.1 F (37.3 C) (Rectal)   Resp (!) 23   Ht 5\' 10"  (1.778 m)   Wt 108.9 kg   SpO2 92%   BMI 34.44 kg/m   Exam: Physical Exam Constitutional:      Appearance: Normal appearance.  HENT:     Head: Normocephalic and atraumatic.     Nose: Nose normal. No congestion or rhinorrhea.  Neck:     Musculoskeletal: Normal range of motion and neck supple. No neck rigidity or muscular tenderness.     Vascular: No carotid bruit.     Comments: Incision site clean and intact with durabond . Mild swelling under incision site. No nodules or mass palpated.  Cardiovascular:     Rate and Rhythm: Normal rate and regular rhythm.  Pulmonary:     Effort: Pulmonary effort is normal.     Breath sounds: Normal breath  sounds.  Abdominal:     General: Abdomen is flat. Bowel sounds are normal. There is no distension.     Palpations: Abdomen is soft. There is no mass.  Musculoskeletal:        General: No swelling.     Right lower leg: No edema.     Left lower leg: No edema.  Lymphadenopathy:     Cervical: No cervical adenopathy.  Skin:    General: Skin is warm and dry.     Capillary Refill: Capillary refill takes less than 2 seconds.  Neurological:     General: No focal deficit present.     Mental Status: He is alert and oriented to person, place, and time.     Deep Tendon Reflexes: Reflexes normal.     Comments: RUE hand grip weakness. Unable to move right arm at elbow and shoulder.  Psychiatric:        Mood and Affect: Mood normal.        Behavior: Behavior normal.     Labs and Imaging: I have personally reviewed following labs and imaging studies CBC: Recent Labs  Lab 05/30/18 1930  WBC 12.6*  NEUTROABS 10.9*  HGB 13.6  HCT 39.2  MCV 81.0  PLT 063   Basic Metabolic Panel: Recent Labs  Lab 05/30/18 1930  NA 135  K 4.2  CL 97*  CO2 27  GLUCOSE 215*  BUN 14  CREATININE 1.76*  CALCIUM 9.2  ALBUMIN 3.6   GFR: Estimated Creatinine Clearance: 51 mL/min (A) (by C-G formula based on SCr of 1.76 mg/dL (H)). Liver Function Tests: Recent Labs  Lab 05/30/18 1930  AST 24  ALT 14  ALKPHOS 64  BILITOT 1.3*  PROT 7.4  ALBUMIN 3.6   Imaging/Diagnostic Tests: Ct Soft Tissue Neck W Contrast Result Date: 05/30/2018 IMPRESSION: 1. Postoperative changes from recent ACDF at C4-C7. Associated retropharyngeal effusion with prevertebral edema compatible with postoperative changes. Superimposed partially loculated collection measuring up to 4.6 cm anterior to the ACDF with extension into the left neck along the surgical approach, likely reflecting postoperative seroma, although superimposed infection could be considered in the correct clinical setting. Secondary mass effect on the  supraglottic  airway which is narrowed and attenuated but remains patent at this time. Close clinical monitoring to insure adequate airway patency recommended. 2. Moderate bilateral mastoid effusions, indeterminate. Correlation with physical exam and symptomatology recommended.  Ct Angio Chest Pe W/cm &/or Wo Cm Result Date: 05/30/2018 IMPRESSION: 1. Suboptimal opacification of subsegmental branch vessels in the lower lobes limits evaluation. No acute central pulmonary embolus is visualized. 2. Mild edema within the anterior neck soft tissues at the level of thyroid, which may be secondary to history of recent surgery 3. Clear lung fields 4. Scattered mediastinal and hilar lymph nodes, patient has history of sarcoidosis 5. Punctate nonobstructing stone upper pole right kidney    EKG Interpretation  Date/Time:  Sunday May 30 2018 18:44:51 EDT Ventricular Rate:  98 PR Interval:    QRS Duration: 103 QT Interval:  341 QTC Calculation: 436 R Axis:   -43 Text Interpretation:  Sinus rhythm Nonspecific T wave abnormality Confirmed by Lajean Saver [phone]) on 05/30/2018 6:50:57 PM       Wilber Oliphant, M.D. 05/30/2018, 10:08 PM PGY-1, [place] Intern pager: [phone], text pages welcome  FPTS Upper-Level Resident Addendum   I have independently interviewed and examined the patient. I have discussed the above with the original author and agree with their documentation. My edits for correction/addition/clarification are in purple. Please see also any attending notes.    Martin[place] Tri Chittick, DO PGY-2, [facility] 05/31/2018 12:50 AM  FPTS Service pager: [phone] (text pages welcome through [facility])

## 2018-05-30 NOTE — ED Triage Notes (Signed)
Pt had recent neck surgery on Friday. Pt here for neck swelling. Pt states he is having trouble swallowing.

## 2018-05-31 ENCOUNTER — Encounter (HOSPITAL_COMMUNITY): Payer: Self-pay

## 2018-05-31 DIAGNOSIS — K91873 Postprocedural seroma of a digestive system organ or structure following other procedure: Secondary | ICD-10-CM

## 2018-05-31 DIAGNOSIS — Z7984 Long term (current) use of oral hypoglycemic drugs: Secondary | ICD-10-CM | POA: Diagnosis not present

## 2018-05-31 DIAGNOSIS — Z8349 Family history of other endocrine, nutritional and metabolic diseases: Secondary | ICD-10-CM | POA: Diagnosis not present

## 2018-05-31 DIAGNOSIS — I129 Hypertensive chronic kidney disease with stage 1 through stage 4 chronic kidney disease, or unspecified chronic kidney disease: Secondary | ICD-10-CM | POA: Diagnosis present

## 2018-05-31 DIAGNOSIS — Z79899 Other long term (current) drug therapy: Secondary | ICD-10-CM | POA: Diagnosis not present

## 2018-05-31 DIAGNOSIS — J95863 Postprocedural seroma of a respiratory system organ or structure following other procedure: Secondary | ICD-10-CM | POA: Diagnosis not present

## 2018-05-31 DIAGNOSIS — Z87891 Personal history of nicotine dependence: Secondary | ICD-10-CM | POA: Diagnosis not present

## 2018-05-31 DIAGNOSIS — K219 Gastro-esophageal reflux disease without esophagitis: Secondary | ICD-10-CM | POA: Diagnosis present

## 2018-05-31 DIAGNOSIS — Z888 Allergy status to other drugs, medicaments and biological substances status: Secondary | ICD-10-CM | POA: Diagnosis not present

## 2018-05-31 DIAGNOSIS — R972 Elevated prostate specific antigen [PSA]: Secondary | ICD-10-CM | POA: Diagnosis present

## 2018-05-31 DIAGNOSIS — M4722 Other spondylosis with radiculopathy, cervical region: Secondary | ICD-10-CM | POA: Diagnosis present

## 2018-05-31 DIAGNOSIS — R1314 Dysphagia, pharyngoesophageal phase: Secondary | ICD-10-CM | POA: Diagnosis not present

## 2018-05-31 DIAGNOSIS — Z8701 Personal history of pneumonia (recurrent): Secondary | ICD-10-CM | POA: Diagnosis not present

## 2018-05-31 DIAGNOSIS — N183 Chronic kidney disease, stage 3 (moderate): Secondary | ICD-10-CM | POA: Diagnosis present

## 2018-05-31 DIAGNOSIS — Z8249 Family history of ischemic heart disease and other diseases of the circulatory system: Secondary | ICD-10-CM | POA: Diagnosis not present

## 2018-05-31 DIAGNOSIS — G839 Paralytic syndrome, unspecified: Secondary | ICD-10-CM | POA: Diagnosis present

## 2018-05-31 DIAGNOSIS — E1122 Type 2 diabetes mellitus with diabetic chronic kidney disease: Secondary | ICD-10-CM | POA: Diagnosis present

## 2018-05-31 DIAGNOSIS — N529 Male erectile dysfunction, unspecified: Secondary | ICD-10-CM | POA: Diagnosis present

## 2018-05-31 DIAGNOSIS — N400 Enlarged prostate without lower urinary tract symptoms: Secondary | ICD-10-CM | POA: Insufficient documentation

## 2018-05-31 DIAGNOSIS — R066 Hiccough: Secondary | ICD-10-CM | POA: Diagnosis present

## 2018-05-31 DIAGNOSIS — R1310 Dysphagia, unspecified: Secondary | ICD-10-CM

## 2018-05-31 DIAGNOSIS — Z833 Family history of diabetes mellitus: Secondary | ICD-10-CM | POA: Diagnosis not present

## 2018-05-31 DIAGNOSIS — D869 Sarcoidosis, unspecified: Secondary | ICD-10-CM | POA: Diagnosis present

## 2018-05-31 DIAGNOSIS — N1831 Chronic kidney disease, stage 3a: Secondary | ICD-10-CM | POA: Diagnosis present

## 2018-05-31 DIAGNOSIS — I10 Essential (primary) hypertension: Secondary | ICD-10-CM | POA: Diagnosis not present

## 2018-05-31 DIAGNOSIS — Z981 Arthrodesis status: Secondary | ICD-10-CM | POA: Diagnosis not present

## 2018-05-31 DIAGNOSIS — R1313 Dysphagia, pharyngeal phase: Secondary | ICD-10-CM | POA: Diagnosis present

## 2018-05-31 DIAGNOSIS — Z23 Encounter for immunization: Secondary | ICD-10-CM | POA: Diagnosis present

## 2018-05-31 DIAGNOSIS — R51 Headache: Secondary | ICD-10-CM | POA: Diagnosis present

## 2018-05-31 DIAGNOSIS — N184 Chronic kidney disease, stage 4 (severe): Secondary | ICD-10-CM | POA: Diagnosis present

## 2018-05-31 HISTORY — DX: Benign prostatic hyperplasia without lower urinary tract symptoms: N40.0

## 2018-05-31 LAB — CBC
HCT: 37.7 % — ABNORMAL LOW (ref 39.0–52.0)
Hemoglobin: 13.3 g/dL (ref 13.0–17.0)
MCH: 28 pg (ref 26.0–34.0)
MCHC: 35.3 g/dL (ref 30.0–36.0)
MCV: 79.4 fL — ABNORMAL LOW (ref 80.0–100.0)
Platelets: 242 10*3/uL (ref 150–400)
RBC: 4.75 MIL/uL (ref 4.22–5.81)
RDW: 12.1 % (ref 11.5–15.5)
WBC: 13.4 10*3/uL — ABNORMAL HIGH (ref 4.0–10.5)
nRBC: 0 % (ref 0.0–0.2)

## 2018-05-31 LAB — BASIC METABOLIC PANEL
Anion gap: 12 (ref 5–15)
BUN: 16 mg/dL (ref 8–23)
CO2: 24 mmol/L (ref 22–32)
Calcium: 9.1 mg/dL (ref 8.9–10.3)
Chloride: 101 mmol/L (ref 98–111)
Creatinine, Ser: 1.72 mg/dL — ABNORMAL HIGH (ref 0.61–1.24)
GFR calc Af Amer: 47 mL/min — ABNORMAL LOW (ref >60)
GFR calc non Af Amer: 41 mL/min — ABNORMAL LOW (ref >60)
Glucose, Bld: 264 mg/dL — ABNORMAL HIGH (ref 70–99)
Potassium: 3.9 mmol/L (ref 3.5–5.1)
Sodium: 137 mmol/L (ref 135–145)

## 2018-05-31 LAB — GLUCOSE, CAPILLARY
Glucose-Capillary: 206 mg/dL — ABNORMAL HIGH (ref 70–99)
Glucose-Capillary: 161 mg/dL — ABNORMAL HIGH (ref 70–99)
Glucose-Capillary: 157 mg/dL — ABNORMAL HIGH (ref 70–99)

## 2018-05-31 LAB — HEMOGLOBIN A1C
Hgb A1c MFr Bld: 7.8 % — ABNORMAL HIGH (ref 4.8–5.6)
Mean Plasma Glucose: 177.16 mg/dL

## 2018-05-31 LAB — HIV ANTIBODY (ROUTINE TESTING W REFLEX): HIV Screen 4th Generation wRfx: NONREACTIVE

## 2018-05-31 MED ORDER — GABAPENTIN 100 MG PO CAPS
100.0000 mg | ORAL_CAPSULE | Freq: Every day | ORAL | Status: DC
  Administered 2018-05-31 (×2): 100 mg via ORAL
  Filled 2018-05-31 (×2): qty 1

## 2018-05-31 MED ORDER — POLYVINYL ALCOHOL 1.4 % OP SOLN
1.0000 [drp] | OPHTHALMIC | Status: DC | PRN
  Administered 2018-05-31: 05:00:00 1 [drp] via OPHTHALMIC
  Filled 2018-05-31: qty 15

## 2018-05-31 MED ORDER — INSULIN ASPART 100 UNIT/ML ~~LOC~~ SOLN: 0.0000 [IU] | Freq: Every day | SUBCUTANEOUS | Status: DC

## 2018-05-31 MED ORDER — DEXAMETHASONE SODIUM PHOSPHATE 4 MG/ML IJ SOLN
4.0000 mg | Freq: Three times a day (TID) | INTRAMUSCULAR | Status: DC
  Administered 2018-05-31 – 2018-06-01 (×5): 4 mg via INTRAVENOUS
  Filled 2018-05-31 (×5): qty 1

## 2018-05-31 MED ORDER — INSULIN ASPART 100 UNIT/ML ~~LOC~~ SOLN
0.0000 [IU] | Freq: Three times a day (TID) | SUBCUTANEOUS | Status: DC
  Administered 2018-05-31: 17:00:00 2 [IU] via SUBCUTANEOUS
  Administered 2018-05-31: 13:00:00 3 [IU] via SUBCUTANEOUS
  Administered 2018-06-01 (×3): 2 [IU] via SUBCUTANEOUS

## 2018-05-31 MED ORDER — HEPARIN SODIUM (PORCINE) 5000 UNIT/ML IJ SOLN
5000.0000 [IU] | Freq: Three times a day (TID) | INTRAMUSCULAR | Status: DC
  Administered 2018-05-31 – 2018-06-01 (×5): 5000 [IU] via SUBCUTANEOUS
  Filled 2018-05-31 (×5): qty 1

## 2018-05-31 MED ORDER — PNEUMOCOCCAL VAC POLYVALENT 25 MCG/0.5ML IJ INJ
0.5000 mL | INJECTION | INTRAMUSCULAR | Status: AC
  Administered 2018-06-01: 0.5 mL via INTRAMUSCULAR
  Filled 2018-05-31: qty 0.5

## 2018-05-31 MED ORDER — MORPHINE SULFATE (PF) 2 MG/ML IV SOLN: 2.0000 mg | INTRAVENOUS | Status: DC | PRN

## 2018-05-31 NOTE — Progress Notes (Signed)
Family Medicine Teaching Service Daily Progress Note Intern Pager: [phone]  Patient name: Thomas Malone Medical record number: [id number] Date of birth: 21-Feb-1950 Age: 68 y.o. Gender: male  Primary Care Provider: Lemmie Evens, MD Consultants: neurosurgery Code Status: full  Pt Overview and Major Events to Date:  4/27 admitted to 4[facility]  Assessment and Plan: Thomas Malone is a 68 y.o. male admitted for throat swelling after anterior cervical disc fusion, postop day 2.   His chronic conditions include type 2 diabetes, hypertension, CKD 3, GERD, BPH, history of sarcoidosis.  # Difficulty swallowing-improved.  Had one brief episode this morning, but resolved quickly with some ice chips, question about timing with dilaudid dose but if this was anaphylaxis it would not resolve with ice chips.  He denies any difficulty breathing at this time.  Neurosurgery recommended admission for evaluation of possible seroma.  On CT scan, there is possible superimposed infection along the surgical site.  Additionally, white count elevated at 13.4 and ANC elevated at 10.2.  Will hold on antibiotics at this time.  White count elevation could also be from being on steroids and being postop day 2.  Will monitor for fevers and monitor CBC.   Vitals per unit routine  Continuous pulse ox  Gentle fluids in setting of HTN, 75 mL/h  Strict I/O   Suction to wall as needed oral secretions  IV zofran PRN nausea  Neurosurgery recs dexamethasone ? 4q8 x48h, next dose 0600  AM CBC w/ diff  S/P Anterior Cervical Fusions  #Right sided weakness & pain   PT/OT daily for RUE exercise   IV Dilaudid PRN pain (CKD III)  # HTN At home, on Benazepril and HCTZ.  Patient's blood pressures elevated to 160s in the ED.  As patient is n.p.o., will start IV hydralazine 3 times daily.  Option to add IV beta-blocker twice daily as needed.  IV hydralazine 10 mg 3 times daily, titrate as needed  Monitor blood  pressures  # CKD III  Creatinine 1.72 which is at baseline.  GFR also at baseline.  Avoid nephrotoxic medications   Trend BMP / urinary output  Replace electrolytes as indicated  Ensure adequate renal perfusion  # DMII  At home, metformin 500mg  BID.  A1c 7.8, at baseline.  Will need outpatient follow-up for chronic disease management  CBG checks AC, qhs  sSSI  # GERD At home, takes omeprazole 40 mg every other day.  We will continue IV PPI while inpatient.  IV PPI daily in setting of NPO   #BPH At home, on uroxatral 10mg  daily.  Hold home medication in setting of n.p.o.  Strict IO in setting of fluid resuscitation  Bladder scan if no output x12 hours  # Hx of Sarcoidosis, chronic, stable CT chest shows: scattered mediastinal and hilar lymph nodes, patient has history of sarcoidosis  #FEN/GI:   Fluids: 75 mL/hr NS   Electrolytes: wnl   Nutrition:  NPO  Access: PIV VTE prophylaxis: Heparin Q 8hrs   Disposition: Admit to telemetry.   Subjective:  Comfortable and pleasant, was initially scared about symptoms but is more optimistic now.  Understands plan of steroids and observation.  Thinks swallowing is improved  Objective: Temp:  [97.8 F (36.6 C)-99.4 F (37.4 C)] 97.9 F (36.6 C) (04/27 1110) Pulse Rate:  [77-102] 102 (04/27 1110) Resp:  [13-23] 20 (04/27 1110) BP: (132-165)/(79-118) 135/93 (04/27 1110) SpO2:  [91 %-100 %] 97 % (04/27 1110) Weight:  [108.9 kg] 108.9 kg (04/26  72) Physical Exam: General: comfortalbe, NAD Cardiovascular: RRR, no murmur to my exam, no significant edema Respiratory: CTAB, no cough to exam, no stridor/IWB, no throat swelling/assymetry and only minimal tenderness, surgery incision clean without erythema Abdomen: soft, obese Extremities: some RUE weaknes assymetrically which PT reportsis expected at this stage  Laboratory: Recent Labs  Lab 05/30/18 1930 05/31/18 0401  WBC 12.6* 13.4*  HGB 13.6 13.3  HCT  39.2 37.7*  PLT 230 242   Recent Labs  Lab 05/30/18 1930 05/31/18 0401  NA 135 137  K 4.2 3.9  CL 97* 101  CO2 27 24  BUN 14 16  CREATININE 1.76* 1.72*  CALCIUM 9.2 9.1  PROT 7.4  --   BILITOT 1.3*  --   ALKPHOS 64  --   ALT 14  --   AST 24  --   GLUCOSE 215* 264*    Imaging/Diagnostic Tests: Ct Soft Tissue Neck W Contrast  Result Date: 05/30/2018 CLINICAL DATA:  Initial evaluation for acute neck swelling, difficulty swallowing. Recent neck surgery. EXAM: CT NECK WITH CONTRAST TECHNIQUE: Multidetector CT imaging of the neck was performed using the standard protocol following the bolus administration of intravenous contrast. CONTRAST:  153mL OMNIPAQUE IOHEXOL 350 MG/ML SOLN COMPARISON:  Prior CT from 03/08/2018. FINDINGS: Pharynx and larynx: Oral cavity within normal limits without discrete mass or loculated collection. No acute abnormality about the dentition. Oropharynx and nasopharynx within normal limits. Epiglottis normal. Vallecula clear. Layering effusion seen within the retropharyngeal space with associated prevertebral edema, extending from the level of C1-2 inferiorly to approximately C7. Findings likely postoperative in nature, as the patient appears to be status post recent ACDF at C4-C7. Stranding and edema extends laterally to partially involve the carotid spaces as well. Superimposed partially loculated collection anterior to the ACDF measures approximately 1.7 x 4.6 x 3.3 cm (series 12, image 90). Collection extends anteriorly into the left neck along the surgical approach. Few small foci of gas noted within this collection as well as within the left neck, likely postoperative. Associated mucosal edema within the hypopharynx. Mass effect on the supraglottic airway which is narrowed and attenuated, measuring 3 mm in AP diameter at its most narrow point. Piriform sinuses are largely effaced. Glottis is largely closed and not well assessed. Subglottic airway bowed to the right  but widely patent and clear. Salivary glands: Salivary glands including the parotid and submandibular glands are within normal limits. Hazy postsurgical stranding partially surrounds the left submandibular gland within the left submandibular space. Thyroid: Thyroid within normal limits. Lymph nodes: No pathologically enlarged lymph nodes identified within the neck. Vascular: Normal intravascular enhancement seen throughout the neck. Limited intracranial: Unremarkable. Visualized orbits: Unremarkable. Mastoids and visualized paranasal sinuses: Scattered mucoperiosteal thickening seen throughout the ethmoidal air cells, sphenoid sinuses, and maxillary sinuses. Few scattered superimposed small retention cyst noted. No air-fluid levels to suggest acute sinusitis. Moderate bilateral mastoid effusions. Middle ear cavities remain pneumatized. Skeleton: Sequelae of recent ACDF at C4 through C7. Hardware appears well positioned and intact. No adverse features. No acute osseous abnormality. No discrete lytic or blastic osseous lesions. Upper chest: Better evaluated on concomitant CT of the chest. Other: None. IMPRESSION: 1. Postoperative changes from recent ACDF at C4-C7. Associated retropharyngeal effusion with prevertebral edema compatible with postoperative changes. Superimposed partially loculated collection measuring up to 4.6 cm anterior to the ACDF with extension into the left neck along the surgical approach, likely reflecting postoperative seroma, although superimposed infection could be considered in the correct clinical setting.  Secondary mass effect on the supraglottic airway which is narrowed and attenuated but remains patent at this time. Close clinical monitoring to insure adequate airway patency recommended. 2. Moderate bilateral mastoid effusions, indeterminate. Correlation with physical exam and symptomatology recommended. Electronically Signed   By: Jeannine Boga M.D.   On: 05/30/2018 21:29   Ct  Angio Chest Pe W/cm &/or Wo Cm  Result Date: 05/30/2018 CLINICAL DATA:  Recent neck surgery shortness of breath positive D-dimer EXAM: CT ANGIOGRAPHY CHEST WITH CONTRAST TECHNIQUE: Multidetector CT imaging of the chest was performed using the standard protocol during bolus administration of intravenous contrast. Multiplanar CT image reconstructions and MIPs were obtained to evaluate the vascular anatomy. CONTRAST:  148mL OMNIPAQUE IOHEXOL 350 MG/ML SOLN COMPARISON:  CT 02/04/2017 FINDINGS: Cardiovascular: Suboptimal opacification of subsegmental branch vessels in the lower lobes. No acute central pulmonary embolus is seen. Nonaneurysmal aorta. Heart size within normal limits. No pericardial effusion Mediastinum/Nodes: Midline trachea. No thyroid mass. Edema within the anterior neck soft tissues at the level of the thyroid with few scattered gas bubbles. Esophagus slightly air distended. Mild mediastinal and hilar nodes. Lungs/Pleura: No acute consolidation or effusion.  No pneumothorax. Upper Abdomen: Punctate nonobstructing stone upper pole right kidney. 5 cm hypodense mass mid pole right kidney, likely a cyst. Musculoskeletal: No acute or suspicious abnormality. Degenerative osteophytes of the spine. Review of the MIP images confirms the above findings. IMPRESSION: 1. Suboptimal opacification of subsegmental branch vessels in the lower lobes limits evaluation. No acute central pulmonary embolus is visualized. 2. Mild edema within the anterior neck soft tissues at the level of thyroid, which may be secondary to history of recent surgery 3. Clear lung fields 4. Scattered mediastinal and hilar lymph nodes, patient has history of sarcoidosis 5. Punctate nonobstructing stone upper pole right kidney Electronically Signed   By: Donavan Foil M.D.   On: 05/30/2018 21:25     Sherene Sires, DO 05/31/2018, 12:54 PM PGY-2, [place] Intern pager: [phone], text pages welcome

## 2018-05-31 NOTE — Evaluation (Signed)
Physical Therapy Evaluation Patient Details Name: Thomas Malone MRN: [id number] DOB: 10-08-1950 Today's Date: 05/31/2018   History of Present Illness  Thomas Malone is a 68 y.o. male admitted for throat swelling after anterior cervical disc fusion, postop day 2.    Clinical Impression  Pt admitted with above. Pt mobilizing at supervision level. Educated on cervical precautions. Pt with significant R UE weakness. Per patient, MD reports it will return in a few days. Will await OT assessment however suspect pt will need follow up therapy on RUE if strength doesn't begin to return this week. Acute PT to follow.    Follow Up Recommendations No PT follow up;Supervision - Intermittent(may need HHPT or outpt for R UE if doesnt improve)    Equipment Recommendations  None recommended by PT    Recommendations for Other Services       Precautions / Restrictions Precautions Precautions: Cervical Precaution Booklet Issued: No Precaution Comments: educated pt on log roll and minimizing pushing/pulling with bilat UEs ot minimize strain on neck Restrictions Weight Bearing Restrictions: No      Mobility  Bed Mobility Overal bed mobility: Needs Assistance Bed Mobility: Rolling;Sidelying to Sit Rolling: Supervision Sidelying to sit: Min guard       General bed mobility comments: max directional v/'cs, increased time, no physical assist  Transfers Overall transfer level: Needs assistance Equipment used: None Transfers: Sit to/from Stand Sit to Stand: Supervision         General transfer comment: pt with mild difficulty due to lower surface height but able to complete without physical assist  Ambulation/Gait Ambulation/Gait assistance: Supervision Gait Distance (Feet): 200 Feet Assistive device: None Gait Pattern/deviations: WFL(Within Functional Limits) Gait velocity: dec Gait velocity interpretation: 1.31 - 2.62 ft/sec, indicative of limited community ambulator General Gait  Details: pt with no R UE swing, no episodes of LOB or instability  Stairs Stairs: Yes   Stair Management: One rail Left;Alternating pattern;Forwards Number of Stairs: 2(to mimic home se tup) General stair comments: pt unable to grip railing with R UE, has to use HR on the L, pt with handrails on both sides at home  Wheelchair Mobility    Modified Rankin (Stroke Patients Only)       Balance Overall balance assessment: Mild deficits observed, not formally tested                                           Pertinent Vitals/Pain Pain Assessment: 0-10 Pain Score: 3  Pain Location: neck at incision Pain Descriptors / Indicators: Sore Pain Intervention(s): Monitored during session    Home Living Family/patient expects to be discharged to:: Private residence Living Arrangements: Spouse/significant other Available Help at Discharge: Family;Available 24 hours/day Type of Home: House Home Access: Stairs to enter Entrance Stairs-Rails: Can reach both Entrance Stairs-Number of Steps: 2 Home Layout: One level(has a basement but doesn't go down there) Home Equipment: None      Prior Function Level of Independence: Independent         Comments: pt was indep up until surgery on friday, pt has been sleeping in recliner because it was easier, pt cont to amb without AD     Hand Dominance   Dominant Hand: Right    Extremity/Trunk Assessment   Upper Extremity Assessment Upper Extremity Assessment: RUE deficits/detail RUE Deficits / Details: shld2-/5, elbow 3-/5, grip 3-/5    Lower  Extremity Assessment Lower Extremity Assessment: Generalized weakness    Cervical / Trunk Assessment Cervical / Trunk Assessment: Other exceptions Cervical / Trunk Exceptions: recent neck surgery  Communication   Communication: No difficulties  Cognition Arousal/Alertness: Awake/alert Behavior During Therapy: WFL for tasks assessed/performed Overall Cognitive Status: Within  Functional Limits for tasks assessed                                        General Comments General comments (skin integrity, edema, etc.): incision at anterior neck healing, no drainage    Exercises Other Exercises Other Exercises: PROM to R shoulder into abd and flexion Other Exercises: worked on scapular retraction and AA elbow flexion   Assessment/Plan    PT Assessment Patient needs continued PT services  PT Problem List Decreased strength;Decreased range of motion;Decreased activity tolerance;Decreased balance;Decreased mobility       PT Treatment Interventions DME instruction;Gait training;Stair training;Functional mobility training;Therapeutic activities;Therapeutic exercise;Balance training;Neuromuscular re-education    PT Goals (Current goals can be found in the Care Plan section)  Acute Rehab PT Goals Patient Stated Goal: get better PT Goal Formulation: With patient Time For Goal Achievement: 06/14/18 Potential to Achieve Goals: Good    Frequency Min 2X/week   Barriers to discharge        Co-evaluation               AM-PAC PT "6 Clicks" Mobility  Outcome Measure Help needed turning from your back to your side while in a flat bed without using bedrails?: None Help needed moving from lying on your back to sitting on the side of a flat bed without using bedrails?: None Help needed moving to and from a bed to a chair (including a wheelchair)?: None Help needed standing up from a chair using your arms (e.g., wheelchair or bedside chair)?: A Little Help needed to walk in hospital room?: A Little Help needed climbing 3-5 steps with a railing? : A Little 6 Click Score: 21    End of Session Equipment Utilized During Treatment: Gait belt Activity Tolerance: Patient tolerated treatment well Patient left: in chair;with call bell/phone within reach;with nursing/sitter in room Nurse Communication: Mobility status PT Visit Diagnosis: Unsteadiness  on feet (R26.81);Muscle weakness (generalized) (M62.81)    Time: 9977-4142 PT Time Calculation (min) (ACUTE ONLY): 24 min   Charges:   PT Evaluation $PT Eval Low Complexity: 1 Low PT Treatments $Gait Training: 8-22 mins        Kittie Plater, PT, DPT Acute Rehabilitation Services Pager #: [phone] Office #: [phone]   Berline Lopes 05/31/2018, 8:57 AM

## 2018-05-31 NOTE — Evaluation (Addendum)
Occupational Therapy Evaluation Patient Details Name: Thomas Malone MRN: [id number] DOB: [birth year] Today's Date: 05/31/2018    History of Present Illness Thomas Malone is a 68 y.o. male admitted for throat swelling after anterior cervical disc fusion, postop day 2.  PMHx includes HTN, DM, CKD stage 3,  Hx of Sarcoidosis   Clinical Impression   This 68 y/o male presents with the above. PTA pt is independent with ADL and functional mobility. Pt presents seated in recliner pleasant and willing to participate in therapy session. Pt presenting most notably with dominant RUE deficits today, impacting his functional performance. Pt with good fine motor/digit ROM however with decreased shoulder and elbow ROM/strength. Pt performing functional mobility without AD and overall supervision, currently requires min-modA for ADL given decreased use of RUE. Initiated education of RUE HEP for scapula and self-ROM to R elbow with pt return demonstrating during session. He will benefit from continued acute OT services and post acute OT services to further address pt's RUE deficits and to maximize his safety and independence with ADL and mobility. Pt deficits more appropriate for outpatient therapies however given PMHx and current "stay at home" order pt may be more appropriate for [facility] at this time. Will follow.     Follow Up Recommendations  Home health OT;Outpatient OT(HH vs OPOT)    Equipment Recommendations  None recommended by OT           Precautions / Restrictions Precautions Precautions: Cervical Precaution Booklet Issued: No Precaution Comments: educated pt on log roll and minimizing pushing/pulling with bilat UEs ot minimize strain on neck Restrictions Weight Bearing Restrictions: No      Mobility Bed Mobility               General bed mobility comments: received OOB in recliner   Transfers Overall transfer level: Needs assistance Equipment used: None Transfers: Sit to/from  Stand Sit to Stand: Supervision         General transfer comment: performing sit<>stand with supervision for safety/balance, no physical assist required     Balance Overall balance assessment: Mild deficits observed, not formally tested                                         ADL either performed or assessed with clinical judgement   ADL Overall ADL's : Needs assistance/impaired Eating/Feeding: Sitting;Set up   Grooming: Standing;Min guard;Minimal assistance   Upper Body Bathing: Sitting;Minimal assistance   Lower Body Bathing: Sit to/from stand;Minimal assistance   Upper Body Dressing : Sitting;Minimal assistance   Lower Body Dressing: Sit to/from stand;Minimal assistance;Moderate assistance Lower Body Dressing Details (indicate cue type and reason): pt using figure 4 to perform LB ADL Toilet Transfer: Supervision/safety;Ambulation Toilet Transfer Details (indicate cue type and reason): simulated via transfer to/from recliner Toileting- Clothing Manipulation and Hygiene: Sit to/from stand;Minimal assistance       Functional mobility during ADLs: Supervision/safety General ADL Comments: pt requiring increased assist due to RUE deficits                          Pertinent Vitals/Pain Pain Assessment: Faces Faces Pain Scale: Hurts little more Pain Location: neck at incision Pain Descriptors / Indicators: Sore Pain Intervention(s): Monitored during session     Hand Dominance Right   Extremity/Trunk Assessment Upper Extremity Assessment Upper Extremity Assessment: RUE deficits/detail RUE Deficits /  Details: shlder and elbow grossly 2-/5, good grip strength and full digit/wrist ROM, demonstrates pronation/supination  RUE Sensation: (denies sensation changes) RUE Coordination: decreased gross motor   Lower Extremity Assessment Lower Extremity Assessment: Defer to PT evaluation   Cervical / Trunk Assessment Cervical / Trunk Assessment: Other  exceptions Cervical / Trunk Exceptions: recent neck surgery   Communication Communication Communication: No difficulties   Cognition Arousal/Alertness: Awake/alert Behavior During Therapy: WFL for tasks assessed/performed Overall Cognitive Status: Within Functional Limits for tasks assessed                                     General Comments       Exercises Exercises: Other exercises Other Exercises Other Exercises: PROM to R shoulder into abd and flexion Other Exercises: worked on scapular retraction and AA elbow flexion - educated in self ROM for elbow flexion  Other Exercises: educated in lap slides with thumb facing up, emphasis on maintaining scapular depression during movements as pt fatigues easily and begins to utilize compensatory movements   Shoulder Instructions      Home Living Family/patient expects to be discharged to:: Private residence Living Arrangements: Spouse/significant other Available Help at Discharge: Family;Available 24 hours/day Type of Home: House Home Access: Stairs to enter [employer] of Steps: 2 Entrance Stairs-Rails: Can reach both Home Layout: One level(has a basement but doesn't go down there)     Bathroom Shower/Tub: Teacher, early years/pre: Handicapped height     Home Equipment: Tub bench          Prior Functioning/Environment Level of Independence: Independent        Comments: pt was indep up until surgery on friday, pt has been sleeping in recliner because it was easier, pt cont to amb without AD        OT Problem List: Decreased strength;Decreased range of motion;Impaired UE functional use;Decreased knowledge of precautions;Decreased coordination      OT Treatment/Interventions: Therapeutic exercise;Self-care/ADL training;Neuromuscular education;Energy conservation;DME and/or AE instruction;Therapeutic activities;Patient/family education;Balance training    OT Goals(Current goals can  be found in the care plan section) Acute Rehab OT Goals Patient Stated Goal: get better OT Goal Formulation: With patient Time For Goal Achievement: 06/14/18 Potential to Achieve Goals: Good  OT Frequency: Min 3X/week   Barriers to D/C:            Co-evaluation              AM-PAC OT "6 Clicks" Daily Activity     Outcome Measure Help from another person eating meals?: A Little Help from another person taking care of personal grooming?: A Little Help from another person toileting, which includes using toliet, bedpan, or urinal?: A Little Help from another person bathing (including washing, rinsing, drying)?: A Little Help from another person to put on and taking off regular upper body clothing?: A Little Help from another person to put on and taking off regular lower body clothing?: A Little 6 Click Score: 18   End of Session Nurse Communication: Mobility status  Activity Tolerance: Patient tolerated treatment well Patient left: in chair;with call bell/phone within reach  OT Visit Diagnosis: Muscle weakness (generalized) (M62.81)                Time: 2229-7989 OT Time Calculation (min): 19 min Charges:  OT General Charges $OT Visit: 1 Visit OT Evaluation $OT Eval Moderate Complexity: 1 Mod  Lou Cal, OT Supplemental Rehabilitation Services Pager [phone] Office [phone]   Raymondo Band 05/31/2018, 1:17 PM

## 2018-05-31 NOTE — Consult Note (Addendum)
Neurosurgery Consultation  Reason for Consult: dysphagia s/p ACDF Referring Physician: McDiarmid  CC: dysphagia  HPI: This is a 68 y.o. man POD3 s/p 3 level ACDF with Dr. Christella Noa. Preop, he was having significant hand weakness on the right that has improved post-op. He had some RUE abduction weakness on POD1 that is consistent with post-op idiopathic C5 palsy. Yesterday he started having increasing difficulty with swallowing and couldn't swallow his pills or keep hydrated, so he came to the ED. Since that time, he denies any respiratory difficulty, but does feel a little short of breath if he has frequent hiccoughs. He thinks his vocal tone / quality is at baseline. This morning he was able to eat ice chips without difficulty, whereas yesterday he could not swallow his saliva.     ROS: A 14 point ROS was performed and is negative except as noted in the HPI.   PMHx:  Past Medical History:  Diagnosis Date  . Abnormal CT of the chest 01/23/14  . BPH (benign prostatic hyperplasia) 05/31/2018  . Complication of anesthesia    hard to wake up   . Diabetes mellitus without complication ([place])   . Elevated PSA   . Erectile dysfunction   . Gastroesophageal reflux   . H/O sarcoidosis   . Headache    7-8 months from gabapentin and naproxen  . Hypertension   . Pneumonia of upper lobe of lung ([place]) 12/1213   FamHx:  Family History  Problem Relation Age of Onset  . Cancer Mother   . Heart disease Father   . Hyperlipidemia Sister   . Diabetes Sister   . Heart disease Sister   . Hyperlipidemia Brother   . Diabetes Brother    SocHx:  reports that he has quit smoking. His smoking use included cigars. He has never used smokeless tobacco. He reports that he does not drink alcohol or use drugs.  Exam: Vital signs in last 24 hours: Temp:  [97.8 F (36.6 C)-99.4 F (37.4 C)] 97.8 F (36.6 C) (04/27 0836) Pulse Rate:  [77-101] 87 (04/27 0836) Resp:  [13-23] 20 (04/27 0836) BP:  (132-165)/(79-118) 150/79 (04/27 0836) SpO2:  [91 %-100 %] 100 % (04/27 0836) Weight:  [108.9 kg] 108.9 kg (04/26 1841) General: Awake, alert, cooperative, lying in bed in NAD Head: normocephalic and atruamatic HEENT: L sided transverse cervical incision, appropriately tender, not tense Pulmonary: breathing room air comfortably, no evidence of increased work of breathing, no stridor Abdomen: S NT ND Psych: smiling, affect full and reactive Extremities: warm and well perfused x4 Neuro: AOx3, PERRL, EOMI, FS Strength 5/5 x4 except 3/5 in R delt, SILTx4   Assessment and Plan: 68 y.o. man s/p 3 level ACDF with post-op dysphagia without dysphonia or stridor. CT C-sp personally reviewed, which shows hardware in good position, prevertebral fluid collection c/w seroma.   -no acute neurosurgical intervention indicated at this time -recommend continuing high dose steroids, dex 4q8 x48h, swallowing is already improving -encouraged patient to let us know if he has any shortness of breath, change in voice, or increasing tightness in his neck, continue tele status -can consider metoclopramide / baclofen / or gabapentin prn hiccoughs -R deltoid weakness c/w idiopathic post-operative C5 palsy, about which Dr. Christella Noa has already educated the patient. Should resolve without intervention with a good recovery. -please call with any concerns or questions  Judith Part, MD 05/31/18 9:20 AM [facility]

## 2018-06-01 DIAGNOSIS — K91873 Postprocedural seroma of a digestive system organ or structure following other procedure: Secondary | ICD-10-CM

## 2018-06-01 LAB — GLUCOSE, CAPILLARY
Glucose-Capillary: 169 mg/dL — ABNORMAL HIGH (ref 70–99)
Glucose-Capillary: 199 mg/dL — ABNORMAL HIGH (ref 70–99)

## 2018-06-01 MED ORDER — ENSURE ENLIVE PO LIQD
237.0000 mL | Freq: Two times a day (BID) | ORAL | Status: DC
  Administered 2018-06-01 (×2): 237 mL via ORAL

## 2018-06-01 MED ORDER — ENSURE ENLIVE PO LIQD: 237.0000 mL | Freq: Two times a day (BID) | ORAL | 12 refills | Status: DC

## 2018-06-01 MED ORDER — GABAPENTIN 100 MG PO CAPS: 100.0000 mg | ORAL_CAPSULE | Freq: Two times a day (BID) | ORAL | 0 refills | Status: DC

## 2018-06-01 MED ORDER — GABAPENTIN 100 MG PO CAPS
100.0000 mg | ORAL_CAPSULE | Freq: Two times a day (BID) | ORAL | Status: DC
  Administered 2018-06-01 (×2): 100 mg via ORAL
  Filled 2018-06-01 (×2): qty 1

## 2018-06-01 NOTE — Progress Notes (Signed)
Neurosurgery Service Progress Note  Subjective: No acute events overnight. Feels that his swallowing is better, reports drinking water and eating ice chips without difficulty.  Objective: Vitals:   05/31/18 1110 05/31/18 1606 05/31/18 1952 06/01/18 0614  BP: (!) 135/93 (!) 140/91  140/78  Pulse: (!) 102   92  Resp: 20   16  Temp: 97.9 F (36.6 C)  98.7 F (37.1 C) 97.9 F (36.6 C)  TempSrc:   Oral Oral  SpO2: 97%   96%  Weight:      Height:       Temp (24hrs), Avg:98.1 F (36.7 C), Min:97.8 F (36.6 C), Max:98.7 F (37.1 C)  CBC Latest Ref Rng & Units 05/31/2018 05/30/2018 10/01/2017  WBC 4.0 - 10.5 K/uL 13.4(H) 12.6(H) 5.1  Hemoglobin 13.0 - 17.0 g/dL 13.3 13.6 13.2  Hematocrit 39.0 - 52.0 % 37.7(L) 39.2 41.1  Platelets 150 - 400 K/uL 242 230 223   BMP Latest Ref Rng & Units 05/31/2018 05/30/2018 10/01/2017  Glucose 70 - 99 mg/dL 264(H) 215(H) 186(H)  BUN 8 - 23 mg/dL 16 14 18   Creatinine 0.61 - 1.24 mg/dL 1.72(H) 1.76(H) 1.80(H)  Sodium 135 - 145 mmol/L 137 135 139  Potassium 3.5 - 5.1 mmol/L 3.9 4.2 4.0  Chloride 98 - 111 mmol/L 101 97(L) 107  CO2 22 - 32 mmol/L 24 27 24   Calcium 8.9 - 10.3 mg/dL 9.1 9.2 9.5    Intake/Output Summary (Last 24 hours) at 06/01/2018 0745 Last data filed at 06/01/2018 0423 Gross per 24 hour  Intake 880 ml  Output 1175 ml  Net -295 ml    Current Facility-Administered Medications:  .  0.9 %  sodium chloride infusion, , Intravenous, Continuous, Wilber Oliphant, MD, Last Rate: 75 mL/hr at 05/31/18 0059 .  dexamethasone (DECADRON) injection 4 mg, 4 mg, Intravenous, Q8H, Wilber Oliphant, MD, 4 mg at 06/01/18 0555 .  gabapentin (NEURONTIN) capsule 100 mg, 100 mg, Oral, QHS, Anderson, Chelsey L, DO, 100 mg at 05/31/18 2228 .  heparin injection 5,000 Units, 5,000 Units, Subcutaneous, Q8H, Wilber Oliphant, MD, 5,000 Units at 06/01/18 0555 .  hydrALAZINE (APRESOLINE) injection 10 mg, 10 mg, Intravenous, TID, Wilber Oliphant, MD, 10 mg at 05/31/18 2230 .   HYDROmorphone (DILAUDID) injection 0.5 mg, 0.5 mg, Intravenous, Q3H PRN, Wilber Oliphant, MD, 0.5 mg at 06/01/18 0419 .  insulin aspart (novoLOG) injection 0-5 Units, 0-5 Units, Subcutaneous, QHS, Bland, Scott, DO .  insulin aspart (novoLOG) injection 0-9 Units, 0-9 Units, Subcutaneous, TID WC, Bland, Scott, DO, 2 Units at 05/31/18 1658 .  ondansetron (ZOFRAN) injection 4 mg, 4 mg, Intravenous, Q6H PRN, Wilber Oliphant, MD .  pantoprazole (PROTONIX) injection 40 mg, 40 mg, Intravenous, QHS, Wilber Oliphant, MD, 40 mg at 05/31/18 2231 .  pneumococcal 23 valent vaccine (PNU-IMMUNE) injection 0.5 mL, 0.5 mL, Intramuscular, Tomorrow-1000, McDiarmid, Blane Ohara, MD .  polyvinyl alcohol (LIQUIFILM TEARS) 1.4 % ophthalmic solution 1 drop, 1 drop, Both Eyes, PRN, McDiarmid, Blane Ohara, MD, 1 drop at 05/31/18 0456   Physical Exam: AOx3, PERRL, EOMI, FS Strength 5/5 x4 except 3/5 in R delt, SILTx4 Neck soft  Assessment & Plan: 68 y.o. man s/p 3 level ACDF with idiopathic post-op C5 palsy, admitted post-op with dysphagia without dysphonia or stridor.   -advance diet as tolerated -continue steroids for another 24h -can be discharged once he's tolerating fluid and nutritional intake  Judith Part  06/01/18 7:45 AM

## 2018-06-01 NOTE — Progress Notes (Signed)
Touched base with patient after he ate dinner.  States he was able to eat a bowl of oatmeal, tomato soup, and drink some water without any concern.  He feels comfortable about going home.  States his wife will be able to pick him up tonight.  We will DC him home tonight with appropriate return precautions and follow-up.  Patriciaann Clan, DO

## 2018-06-01 NOTE — Progress Notes (Signed)
Inpatient Diabetes Program Recommendations  AACE/ADA: New Consensus Statement on Inpatient Glycemic Control (2015)  Target Ranges:  Prepandial:   less than 140 mg/dL      Peak postprandial:   less than 180 mg/dL (1-2 hours)      Critically ill patients:  140 - 180 mg/dL   Lab Results  Component Value Date   GLUCAP 199 (H) 06/01/2018   HGBA1C 7.8 (H) 05/31/2018    Review of Glycemic Control Results for Thomas Malone, Thomas Malone (MRN [id number]) as of 06/01/2018 12:10  Ref. Range 05/31/2018 12:17 05/31/2018 16:55 05/31/2018 21:31 06/01/2018 08:00 06/01/2018 11:19  Glucose-Capillary Latest Ref Range: 70 - 99 mg/dL 206 (H) 161 (H) 157 (H) 169 (H) 199 (H)   Diabetes history: DM 2 Outpatient Diabetes medications: Metformin 500 mg bid Current orders for Inpatient glycemic control:  Novolog sensitive tid with meals and HS Decadron 4 mg IV q 8 hours Inpatient Diabetes Program Recommendations:    May consider increasing Novolog correction to moderate tid with meals and HS since on IV steroids.   Thanks,  Adah Perl, RN, BC-ADM Inpatient Diabetes Coordinator Pager [phone] (8a-5p)

## 2018-06-01 NOTE — Progress Notes (Signed)
D/c instructions given to pt, IV removed. Pt verbalizes good understanding regarding medications and MD follow up appointments.

## 2018-06-01 NOTE — Progress Notes (Addendum)
Family Medicine Teaching Service Daily Progress Note Intern Pager: [phone]  Patient name: Thomas Malone Medical record number: [id number] Date of birth: 1950-11-14 Age: 68 y.o. Gender: male  Primary Care Provider: Lemmie Evens, MD Consultants: neurosurgery Code Status: full  Pt Overview and Major Events to Date:  4/27 admitted to 4N  Assessment and Plan: Thomas Malone is a 68 y.o. male admitted for throat swelling after anterior cervical disc fusion, postop day 2.   His chronic conditions include type 2 diabetes, hypertension, CKD 3, GERD, BPH, history of sarcoidosis.  Dysphagia-improved. patient denies difficulty with ice chips swallowing and wants more of a diet. Feels that his swelling has gone down around his throat. Continues to have intermittent hiccups. Believes the gabapentin helped and was able tolerate PO capsule. Will increased to BID - advance diet as tolerated and with neurosurgery blessing - once tolerating diet, dc IV fluids - Vitals per unit routine - Continuous pulse ox - Strict I/O  - Suction to wall as needed oral secretions - IV zofran PRN nausea - Neurosurgery recs dexamethasone ? 4q8 x48h, next dose 0600 - AM CBC w/ diff - gabapentin 100mg  BID for hiccups  S/P Anterior Cervical Fusions- with continued R arm weakness but has continued to improve. Patient participating well with therapy and endorses increased function of R arm. - PT/OT daily for RUE exercise  - IV Dilaudid PRN pain (CKD III)  HTN-improving. home meds: Benazepril and HCTZ. 130-140s overnight. Latest 144/96. Consider returning to home med regimen - IV hydralazine 10 mg TID, titrate as needed -transition back to PO with diet - Monitor blood pressures  CKD III- yesterday- Creatinine 1.72 which is at baseline.  GFR also at baseline. - Avoid nephrotoxic medications  - Trend BMP / urinary output - Replace electrolytes as indicated - Ensure adequate renal perfusion  DMII- home meds:  metformin 500mg  BID.  A1c 7.8. glucose 169 this am. Received 2 units novolog. Also on steroids which will increase glucose - CBG checks AC, qhs - sSSI  GERD: home meds: omeprazole 40 mg every other day.  continue IV - continue home med once diet on  BPH- home med: uroxatral 10mg  daily. ~1.5L uop yesterday  Hold home medication in setting of n.p.o.  Strict IO in setting of fluid resuscitation  Bladder scan if no output x12 hours  # Hx of Sarcoidosis, chronic, stable CT chest shows: scattered mediastinal and hilar lymph nodes, patient has history of sarcoidosis  #FEN/GI:   Fluids: 75 mL/hr NS   Electrolytes: wnl   Nutrition:  NPO  Access: PIV VTE prophylaxis: Heparin Q 8hrs   Disposition: Admit to telemetry.   Subjective:  Patient asleep initially. Easily aroused. Alert and oriented. He feels throat swelling is going down, he has no difficulty with swallowing ice chips or the capsules he received yesterday. He endorses increased ability to move his R arm. He is ready for more food and wants to go home today.   Objective: Temp:  [97.8 F (36.6 C)-98.7 F (37.1 C)] 97.9 F (36.6 C) (04/28 4193) Pulse Rate:  [87-102] 92 (04/28 0614) Resp:  [16-20] 16 (04/28 0614) BP: (135-150)/(78-93) 140/78 (04/28 0614) SpO2:  [96 %-100 %] 96 % (04/28 7902) Physical Exam: General: comfortalbe, NAD Cardiovascular: RRR, no murmur to my exam, no significant edema Respiratory: CTAB, no cough to exam, no stridor/IWB, mild edema to throat but no comparison exam from myself. surgery incision clean without erythema. Able to speak in full sentences with what seems  to be normal voice. Abdomen: soft, obese Extremities: RUE hand has full ROM. Decreased strength with shoulder abduction  Laboratory: Recent Labs  Lab 05/30/18 1930 05/31/18 0401  WBC 12.6* 13.4*  HGB 13.6 13.3  HCT 39.2 37.7*  PLT 230 242   Recent Labs  Lab 05/30/18 1930 05/31/18 0401  NA 135 137  K 4.2 3.9  CL 97*  101  CO2 27 24  BUN 14 16  CREATININE 1.76* 1.72*  CALCIUM 9.2 9.1  PROT 7.4  --   BILITOT 1.3*  --   ALKPHOS 64  --   ALT 14  --   AST 24  --   GLUCOSE 215* 264*    Imaging/Diagnostic Tests: Ct Soft Tissue Neck W Contrast  Result Date: 05/30/2018 CLINICAL DATA:  Initial evaluation for acute neck swelling, difficulty swallowing. Recent neck surgery. EXAM: CT NECK WITH CONTRAST TECHNIQUE: Multidetector CT imaging of the neck was performed using the standard protocol following the bolus administration of intravenous contrast. CONTRAST:  15mL OMNIPAQUE IOHEXOL 350 MG/ML SOLN COMPARISON:  Prior CT from 03/08/2018. FINDINGS: Pharynx and larynx: Oral cavity within normal limits without discrete mass or loculated collection. No acute abnormality about the dentition. Oropharynx and nasopharynx within normal limits. Epiglottis normal. Vallecula clear. Layering effusion seen within the retropharyngeal space with associated prevertebral edema, extending from the level of C1-2 inferiorly to approximately C7. Findings likely postoperative in nature, as the patient appears to be status post recent ACDF at C4-C7. Stranding and edema extends laterally to partially involve the carotid spaces as well. Superimposed partially loculated collection anterior to the ACDF measures approximately 1.7 x 4.6 x 3.3 cm (series 12, image 90). Collection extends anteriorly into the left neck along the surgical approach. Few small foci of gas noted within this collection as well as within the left neck, likely postoperative. Associated mucosal edema within the hypopharynx. Mass effect on the supraglottic airway which is narrowed and attenuated, measuring 3 mm in AP diameter at its most narrow point. Piriform sinuses are largely effaced. Glottis is largely closed and not well assessed. Subglottic airway bowed to the right but widely patent and clear. Salivary glands: Salivary glands including the parotid and submandibular glands are  within normal limits. Hazy postsurgical stranding partially surrounds the left submandibular gland within the left submandibular space. Thyroid: Thyroid within normal limits. Lymph nodes: No pathologically enlarged lymph nodes identified within the neck. Vascular: Normal intravascular enhancement seen throughout the neck. Limited intracranial: Unremarkable. Visualized orbits: Unremarkable. Mastoids and visualized paranasal sinuses: Scattered mucoperiosteal thickening seen throughout the ethmoidal air cells, sphenoid sinuses, and maxillary sinuses. Few scattered superimposed small retention cyst noted. No air-fluid levels to suggest acute sinusitis. Moderate bilateral mastoid effusions. Middle ear cavities remain pneumatized. Skeleton: Sequelae of recent ACDF at C4 through C7. Hardware appears well positioned and intact. No adverse features. No acute osseous abnormality. No discrete lytic or blastic osseous lesions. Upper chest: Better evaluated on concomitant CT of the chest. Other: None. IMPRESSION: 1. Postoperative changes from recent ACDF at C4-C7. Associated retropharyngeal effusion with prevertebral edema compatible with postoperative changes. Superimposed partially loculated collection measuring up to 4.6 cm anterior to the ACDF with extension into the left neck along the surgical approach, likely reflecting postoperative seroma, although superimposed infection could be considered in the correct clinical setting. Secondary mass effect on the supraglottic airway which is narrowed and attenuated but remains patent at this time. Close clinical monitoring to insure adequate airway patency recommended. 2. Moderate bilateral mastoid effusions, indeterminate.  Correlation with physical exam and symptomatology recommended. Electronically Signed   By: Jeannine Boga M.D.   On: 05/30/2018 21:29   Ct Angio Chest Pe W/cm &/or Wo Cm  Result Date: 05/30/2018 CLINICAL DATA:  Recent neck surgery shortness of breath  positive D-dimer EXAM: CT ANGIOGRAPHY CHEST WITH CONTRAST TECHNIQUE: Multidetector CT imaging of the chest was performed using the standard protocol during bolus administration of intravenous contrast. Multiplanar CT image reconstructions and MIPs were obtained to evaluate the vascular anatomy. CONTRAST:  181mL OMNIPAQUE IOHEXOL 350 MG/ML SOLN COMPARISON:  CT 02/04/2017 FINDINGS: Cardiovascular: Suboptimal opacification of subsegmental branch vessels in the lower lobes. No acute central pulmonary embolus is seen. Nonaneurysmal aorta. Heart size within normal limits. No pericardial effusion Mediastinum/Nodes: Midline trachea. No thyroid mass. Edema within the anterior neck soft tissues at the level of the thyroid with few scattered gas bubbles. Esophagus slightly air distended. Mild mediastinal and hilar nodes. Lungs/Pleura: No acute consolidation or effusion.  No pneumothorax. Upper Abdomen: Punctate nonobstructing stone upper pole right kidney. 5 cm hypodense mass mid pole right kidney, likely a cyst. Musculoskeletal: No acute or suspicious abnormality. Degenerative osteophytes of the spine. Review of the MIP images confirms the above findings. IMPRESSION: 1. Suboptimal opacification of subsegmental branch vessels in the lower lobes limits evaluation. No acute central pulmonary embolus is visualized. 2. Mild edema within the anterior neck soft tissues at the level of thyroid, which may be secondary to history of recent surgery 3. Clear lung fields 4. Scattered mediastinal and hilar lymph nodes, patient has history of sarcoidosis 5. Punctate nonobstructing stone upper pole right kidney Electronically Signed   By: Donavan Foil M.D.   On: 05/30/2018 21:25    Richarda Osmond, DO 06/01/2018, 7:37 AM PGY-1,  [facility] Intern pager: [phone], text pages welcome

## 2018-06-01 NOTE — TOC Initial Note (Addendum)
Transition of Care [facility]) - Initial/Assessment Note    Patient Details  Name: Thomas Malone MRN: [id number] Date of Birth: 04/11/1950  Transition of [facility] CM/SW Contact:    Ella Bodo, RN Phone Number: 06/01/2018, 4:51 PM  Clinical Narrative:  Thomas Malone is a 68 y.o. male admitted for throat swelling after anterior cervical disc fusion, postop day 2.   PTA, pt independent, lives with spouse.  PT/OT ordered by MD; met with pt to arrange.  He states that his case is worker's compensation, and needs to be cleared with Case Manager.  He states that lawyer's office will need to call me with contact information.  Spoke with pt's wife, Thomas Malone pt permission)  and provided my contact information.  Lawyer's office to call me back with [facility] Case Manager contact info.  Will follow up in AM.              Addendum:  Pt's lawyer's office called me back with adjustor contact information.   Faxed H&P, PT/OT orders to Jacalyn Lefevre at [facility], fax # [phone]; office # [phone]. CLAIM # [id number]   Expected Discharge Plan: [place] Barriers to Discharge: Continued Medical Work up   Patient Goals and CMS Choice        Expected Discharge Plan and Services Expected Discharge Plan: [place]   Discharge Planning Services: CM Consult   Living arrangements for the past 2 months: Single Family Home                                      Prior Living Arrangements/Services Living arrangements for the past 2 months: Single Family Home Lives with:: Spouse Patient language and need for interpreter reviewed:: Yes Do you feel safe going back to the place where you live?: Yes      Need for Family Participation in Patient Care: Yes (Comment) Care giver support system in place?: Yes (comment)   Criminal Activity/Legal Involvement Pertinent to Current Situation/Hospitalization: No - Comment as needed  Activities of Daily  Living Home Assistive Devices/Equipment: None ADL Screening (condition at time of admission) Patient's cognitive ability adequate to safely complete daily activities?: Yes Is the patient deaf or have difficulty hearing?: No Does the patient have difficulty seeing, even when wearing glasses/contacts?: No Does the patient have difficulty concentrating, remembering, or making decisions?: No Patient able to express need for assistance with ADLs?: Yes Does the patient have difficulty dressing or bathing?: Yes(Torn ligament RT arm) Independently performs ADLs?: Yes (appropriate for developmental age) Does the patient have difficulty walking or climbing stairs?: No Weakness of Legs: None Weakness of Arms/Hands: Right  Permission Sought/Granted Permission sought to share information with : Family Supports, Other (comment)([facility] Case Manager) Permission granted to share information with : Yes, Verbal Permission Granted  Share Information with NAME: Thomas Malone, wife  [phone]           Emotional Assessment Appearance:: Appears stated age Attitude/Demeanor/Rapport: Engaged Affect (typically observed): Accepting, Appropriate Orientation: : Oriented to Self, Oriented to Place, Oriented to  Time, Oriented to Situation Alcohol / Substance Use: Not Applicable Psych Involvement: No (comment)  Admission diagnosis:  Postoperative seroma involving respiratory system after non-respiratory system procedure [W73.710] Patient Active Problem List   Diagnosis Date Noted  . Dysphagia 05/31/2018  . Chronic kidney disease, stage 3a ([place]) 05/31/2018  . BPH (benign prostatic hyperplasia) 05/31/2018  .  Postoperative seroma involving digestive system after non-digestive system procedure 05/30/2018  . H/O sarcoidosis   . Hypertension   . Erectile dysfunction    PCP:  Lemmie Evens, MD Pharmacy:   [place], [place] - [place] [address] [place] [address] [place] [address] Phone:  [phone] Fax: [phone]        Readmission Risk Interventions No flowsheet data found.   Reinaldo Raddle, RN, BSN  Trauma/Neuro ICU Case Manager [phone]

## 2018-06-01 NOTE — Plan of Care (Signed)
  Problem: Education: Goal: Knowledge of General Education information will improve Description Including pain rating scale, medication(s)/side effects and non-pharmacologic comfort measures Outcome: Adequate for Discharge   Problem: Health Behavior/Discharge Planning: Goal: Ability to manage health-related needs will improve Outcome: Adequate for Discharge   Problem: Clinical Measurements: Goal: Ability to maintain clinical measurements within normal limits will improve Outcome: Adequate for Discharge Goal: Will remain free from infection Outcome: Adequate for Discharge Goal: Diagnostic test results will improve Outcome: Adequate for Discharge Goal: Respiratory complications will improve Outcome: Adequate for Discharge Goal: Cardiovascular complication will be avoided Outcome: Adequate for Discharge   Problem: Activity: Goal: Risk for activity intolerance will decrease Outcome: Adequate for Discharge   Problem: Nutrition: Goal: Adequate nutrition will be maintained Outcome: Adequate for Discharge   Problem: Coping: Goal: Level of anxiety will decrease Outcome: Adequate for Discharge   Problem: Elimination: Goal: Will not experience complications related to bowel motility Outcome: Adequate for Discharge Goal: Will not experience complications related to urinary retention Outcome: Adequate for Discharge   Problem: Pain Managment: Goal: General experience of comfort will improve Outcome: Adequate for Discharge   Problem: Safety: Goal: Ability to remain free from injury will improve Outcome: Adequate for Discharge   Problem: Skin Integrity: Goal: Risk for impaired skin integrity will decrease Outcome: Adequate for Discharge   Problem: Respiratory: Goal: Will regain and/or maintain adequate ventilation Outcome: Adequate for Discharge

## 2018-06-01 NOTE — Discharge Instructions (Signed)
Thank you for allowing [place] to be part of your care at [facility] were admitted for close monitoring of your difficulty with swallowing after recent surgery on your spine.  We consulted with the neurosurgery team, who recommended increasing steroids.  Fortunately, through this time your swallowing has improved and you have been able to maintain a liquid diet.  We expect your swallowing to continue to improve over time.  Please make sure you follow-up with your primary care provider in the next week and also follow with neurosurgery as instructed.  You will go back to twice daily dosing of your steroid at discharge.  Please seek care if you are unable to sustain nutrition including not being able to tolerate any liquids, shortness of breath, chest pains, uncontrolled severe pain.

## 2018-06-01 NOTE — Discharge Summary (Signed)
[facility] Discharge Summary  Patient name: Thomas Malone Medical record number: [id number] Date of birth: Sep 27, 1950 Age: 68 y.o. Gender: male Date of Admission: 05/30/2018  Date of Discharge: 06/01/2018 Admitting Physician: Blane Ohara McDiarmid, MD  Primary Care Provider: Lemmie Evens, MD Consultants: neurosurgery  Indication for Hospitalization:  dysphagia  Discharge Diagnoses/Problem List:  Dysphagia 2/2 seroma s/p anterior cervical fusion HTN CKDIII DMII GERD BPH Sarcoidosis   Disposition: discharge home  Discharge Condition: improved  Discharge Exam: (from progress note on day of discharge) General: comfortalbe, NAD Cardiovascular: RRR, no murmur to my exam, no significant edema Respiratory: CTAB, no cough to exam, no stridor/IWB, mild edema to throat but no comparison exam from myself. surgery incision clean without erythema. Able to speak in full sentences with what seems to be normal voice. Abdomen: soft, obese Extremities: RUE hand has full ROM. Decreased strength with shoulder abduction  Brief Hospital Course:  Patient admitted for dysphagia 2/2 seroma subacute from cervical surgery 2 days prior. He remained stable ORA but ws having difficulty swallowing even saliva initially. Patient had increased dose of steroid to improve swelling. Patient remained NPO for a day and received IV hydration. His home medications were continued IV. He was slowly advanced on diet and able to tolerate liquids and soft diet by third day of admission. His home medications were then transitioned to PO and he was able to tolerate. He complained of hiccups during admission and was prescribed gabapentin which improved symptoms. Neurosurgery continued to follow up and cleared patient for discharge with plan to follow up outpatient.  Issues for Follow Up:  1. Follow up with neurosurgery  Significant Procedures:  none  Significant Labs and Imaging:  Recent Labs   Lab 05/30/18 1930 05/31/18 0401  WBC 12.6* 13.4*  HGB 13.6 13.3  HCT 39.2 37.7*  PLT 230 242   Recent Labs  Lab 05/30/18 1930 05/31/18 0401  NA 135 137  K 4.2 3.9  CL 97* 101  CO2 27 24  GLUCOSE 215* 264*  BUN 14 16  CREATININE 1.76* 1.72*  CALCIUM 9.2 9.1  ALKPHOS 64  --   AST 24  --   ALT 14  --   ALBUMIN 3.6  --     Results/Tests Pending at Time of Discharge:  none  Discharge Medications:  Allergies as of 06/01/2018      Reactions   Diclofenac Sodium Anaphylaxis   Topical gel Topical gel   Levaquin [levofloxacin In D5w] Anaphylaxis   Lidocaine Anaphylaxis      Medication List    TAKE these medications   alfuzosin 10 MG 24 hr tablet Commonly known as:  UROXATRAL Take 10 mg by mouth daily with breakfast.   aspirin EC 81 MG tablet Take 81 mg by mouth daily.   benazepril 20 MG tablet Commonly known as:  LOTENSIN Take 20 mg by mouth daily.   dexamethasone 4 MG tablet Commonly known as:  DECADRON Take 4 mg by mouth 2 (two) times daily with a meal.   feeding supplement (ENSURE ENLIVE) Liqd Take 237 mLs by mouth 2 (two) times daily between meals.   gabapentin 100 MG capsule Commonly known as:  NEURONTIN Take 1 capsule (100 mg total) by mouth 2 (two) times daily.   Glucosamine Chondr 1500 Complx Caps Take 2 capsules by mouth daily.   hydrochlorothiazide 25 MG tablet Commonly known as:  HYDRODIURIL Take 25 mg by mouth daily.   HYDROcodone-acetaminophen 5-325 MG tablet Commonly known as:  NORCO/VICODIN Take 1 tablet by mouth every 6 (six) hours as needed for moderate pain.   metFORMIN 500 MG 24 hr tablet Commonly known as:  GLUCOPHAGE-XR Take 500 mg by mouth 2 (two) times daily.   MULTIVITAMIN ADULTS 50+ PO Take 1 tablet by mouth daily.   omeprazole 40 MG capsule Commonly known as:  PRILOSEC Take 40 mg by mouth daily.   tiZANidine 4 MG tablet Commonly known as:  ZANAFLEX Take 4 mg by mouth every 8 (eight) hours as needed for muscle  spasms.       Discharge Instructions: Please refer to Patient Instructions section of EMR for full details.  Patient was counseled important signs and symptoms that should prompt return to medical care, changes in medications, dietary instructions, activity restrictions, and follow up appointments.   Follow-Up Appointments: Follow-up Information    Lemmie Evens, MD. Schedule an appointment as soon as possible for a visit.   Specialty:  Family Medicine Why:  Please make appointment within the next week with your primary care provider for hospital follow-up, sooner if needed. Contact information: [place] [address] [phone]        Judith Part, MD Follow up.   Specialty:  Neurosurgery Why:  Please make sure you follow-up with neurosurgery as instructed. Contact information: [place] [address] [phone]           Richarda Osmond, DO 06/02/2018, 12:29 PM PGY-1, [place]

## 2018-06-01 NOTE — Progress Notes (Signed)
Pt d/c home with spouse.  Carried to [employer] parking via w/c and NT.  No acute distress noted.

## 2018-06-01 NOTE — Progress Notes (Signed)
Patient reported being unable to tolerate eating the grits that he ordered for lunch.  Patient reported, "they tasted great, but I felt like it was all going to come back up. Almost like it made me gag not because I was nauseous."  Patient did however tolerate everything else, including 100% of the cream of chicken soup and vanilla ice cream.  Discussed with MD.

## 2019-03-08 DIAGNOSIS — B360 Pityriasis versicolor: Secondary | ICD-10-CM | POA: Diagnosis not present

## 2019-03-31 DIAGNOSIS — H25813 Combined forms of age-related cataract, bilateral: Secondary | ICD-10-CM | POA: Diagnosis not present

## 2019-03-31 DIAGNOSIS — Z48810 Encounter for surgical aftercare following surgery on the sense organs: Secondary | ICD-10-CM | POA: Diagnosis not present

## 2019-03-31 DIAGNOSIS — H40053 Ocular hypertension, bilateral: Secondary | ICD-10-CM | POA: Diagnosis not present

## 2019-04-04 DIAGNOSIS — H25812 Combined forms of age-related cataract, left eye: Secondary | ICD-10-CM | POA: Diagnosis not present

## 2019-04-04 NOTE — H&P (Signed)
Surgical History & Physical  Patient Name: Thomas Malone DOB: Jun 04, 1950  Surgery: Cataract extraction with intraocular lens implant phacoemulsification; Left Eye  Surgeon: Baruch Goldmann MD Surgery Date:  04/11/2019 Pre-Op Date:  03/31/2019  HPI: A 89 Yr. old male patient (repeat IOL Master today) 1. 1. The patient is returning for a Preop cataract eval 3 month follow-up of the right eye. Since the last visit, the affected area decreased and is worsening. The patient's vision is blurry. The condition's severity is constant. VA continues to worsen with time overall distance and near. Glare with lights at night. This is negatively affecting the patient's quality of life.Patient ready to proceed with cataract surgery for BCVA. HPI Completed by Dr. Baruch Goldmann  Medical History: Glaucoma LASIK Diabetes High Blood Pressure  Review of Systems Negative Allergic/Immunologic Negative Cardiovascular Negative Constitutional Negative Ear, Nose, Mouth & Throat Negative Endocrine Negative Eyes Negative Gastrointestinal Negative Genitourinary Negative Hemotologic/Lymphatic Negative Integumentary Negative Musculoskeletal Negative Neurological Negative Psychiatry Negative Respiratory  Social   Never smoked   Medication Benazepril, HCTZ, Metformin, Omeprazole, Cosamin DS, Aspirin, Testosterone, Multivitamin,   Sx/Procedures Lasik,  Shoulder surgery, Neck surgery,   Drug Allergies  Levofloxacin, Voltaren topical, Lidocaine,   History & Physical: Heent:  Cataract, Left eye NECK: supple without bruits LUNGS: lungs clear to auscultation CV: regular rate and rhythm Abdomen: soft and non-tender  Impression & Plan: Assessment: 1.  COMBINED FORMS AGE RELATED CATARACT; Both Eyes (H25.813) 2.  s/p LASIK/PRK/RK ; Both Eyes (Z48.810) 3.  Ocular Hypertension; Both Eyes (H40.053)  Plan: 1.  Cataract accounts for the patient's decreased vision. This visual impairment is not correctable  with a tolerable change in glasses or contact lenses. Cataract surgery with an implantation of a new lens should significantly improve the visual and functional status of the patient. Discussed all risks, benefits, alternatives, and potential complications. Discussed the procedures and recovery. Patient desires to have surgery. A-scan ordered and performed today for intra-ocular lens calculations. The surgery will be performed in order to improve vision for driving, reading, and for eye examinations. Recommend phacoemulsification with intra-ocular lens. Left Eye. worse first. Vivity lens OS. Dilates well - shugarcaine by protocol. S/P HYPEROPIC LASIK - Barrett True K 2.  S/P Hyperopic treatment OU - enhancement OS. Monovision - OS. 3.  Will monitor throughout surgery process - phaco PCIOL often lowers IOP.

## 2019-04-05 NOTE — Patient Instructions (Signed)
[place]  04/05/2019     @PREFPERIOPPHARMACY @   Your procedure is scheduled on  04/11/2019   Report to [facility] at  [phone]   A.M.  Call this number if you have problems the morning of surgery:  [phone]   Remember:  Do not eat or drink after midnight.                        Take these medicines the morning of surgery with A SIP OF WATER  Prilosec. DO NOT take any medications for diabetes the morning of your procedure.    Do not wear jewelry, make-up or nail polish.  Do not wear lotions, powders, or perfumes. Please wear deodorant and brush your teeth.  Do not shave 48 hours prior to surgery.  Men may shave face and neck.  Do not bring valuables to the hospital.  [facility] is not responsible for any belongings or valuables.  Contacts, dentures or bridgework may not be worn into surgery.  Leave your suitcase in the car.  After surgery it may be brought to your room.  For patients admitted to the hospital, discharge time will be determined by your treatment team.  Patients discharged the day of surgery will not be allowed to drive home.   Name and phone number of your driver:   family Special instructions:  DO NOT smoke the morning of your procedure.  Please read over the following fact sheets that you were given. Anesthesia Post-op Instructions and Care and Recovery After Surgery       Cataract Surgery, Care After This sheet gives you information about how to care for yourself after your procedure. Your health care provider may also give you more specific instructions. If you have problems or questions, contact your health care provider. What can I expect after the procedure? After the procedure, it is common to have:  Itching.  Discomfort.  Fluid discharge.  Sensitivity to light and to touch.  Bruising in or around the eye.  Mild blurred vision. Follow these instructions at home: Eye care   Do not touch or rub your eyes.  Protect your  eyes as told by your health care provider. You may be told to wear a protective eye shield or sunglasses.  Do not put a contact lens into the affected eye or eyes until your health care provider approves.  Keep the area around your eye clean and dry: ? Avoid swimming. ? Do not allow water to hit you directly in the face while showering. ? Keep soap and shampoo out of your eyes.  Check your eye every day for signs of infection. Watch for: ? Redness, swelling, or pain. ? Fluid, blood, or pus. ? Warmth. ? A bad smell. ? Vision that is getting worse. ? Sensitivity that is getting worse. Activity  Do not drive for 24 hours if you were given a sedative during your procedure.  Avoid strenuous activities, such as playing contact sports, for as long as told by your health care provider.  Do not drive or use heavy machinery until your health care provider approves.  Do not bend or lift heavy objects. Bending increases pressure in the eye. You can walk, climb stairs, and do light household chores.  Ask your health care provider when you can return to work. If you work in a dusty environment, you may be advised to wear protective eyewear for a period  of time. General instructions  Take or apply over-the-counter and prescription medicines only as told by your health care provider. This includes eye drops.  Keep all follow-up visits as told by your health care provider. This is important. Contact a health care provider if:  You have increased bruising around your eye.  You have pain that is not helped with medicine.  You have a fever.  You have redness, swelling, or pain in your eye.  You have fluid, blood, or pus coming from your incision.  Your vision gets worse.  Your sensitivity to light gets worse. Get help right away if:  You have sudden loss of vision.  You see flashes of light or spots (floaters).  You have severe eye pain.  You develop nausea or  vomiting. Summary  After your procedure, it is common to have itching, discomfort, bruising, fluid discharge, or sensitivity to light.  Follow instructions from your health care provider about caring for your eye after the procedure.  Do not rub your eye after the procedure. You may need to wear eye protection or sunglasses. Do not wear contact lenses. Keep the area around your eye clean and dry.  Avoid activities that require a lot of effort. These include playing sports and lifting heavy objects.  Contact a health care provider if you have increased bruising, pain that does not go away, or a fever. Get help right away if you suddenly lose your vision, see flashes of light or spots, or have severe pain in the eye. This information is not intended to replace advice given to you by your health care provider. Make sure you discuss any questions you have with your health care provider. Document Revised: 11/16/2018 Document Reviewed: 07/20/2017 Elsevier Patient Education  2020 [employer] After These instructions provide you with information about caring for yourself after your procedure. Your health care provider may also give you more specific instructions. Your treatment has been planned according to current medical practices, but problems sometimes occur. Call your health care provider if you have any problems or questions after your procedure. What can I expect after the procedure? After your procedure, you may:  Feel sleepy for several hours.  Feel clumsy and have poor balance for several hours.  Feel forgetful about what happened after the procedure.  Have poor judgment for several hours.  Feel nauseous or vomit.  Have a sore throat if you had a breathing tube during the procedure. Follow these instructions at home: For at least 24 hours after the procedure:      Have a responsible adult stay with you. It is important to have someone help  care for you until you are awake and alert.  Rest as needed.  Do not: ? Participate in activities in which you could fall or become injured. ? Drive. ? Use heavy machinery. ? Drink alcohol. ? Take sleeping pills or medicines that cause drowsiness. ? Make important decisions or sign legal documents. ? Take care of children on your own. Eating and drinking  Follow the diet that is recommended by your health care provider.  If you vomit, drink water, juice, or soup when you can drink without vomiting.  Make sure you have little or no nausea before eating solid foods. General instructions  Take over-the-counter and prescription medicines only as told by your health care provider.  If you have sleep apnea, surgery and certain medicines can increase your risk for breathing problems. Follow instructions from your  health care provider about wearing your sleep device: ? Anytime you are sleeping, including during daytime naps. ? While taking prescription pain medicines, sleeping medicines, or medicines that make you drowsy.  If you smoke, do not smoke without supervision.  Keep all follow-up visits as told by your health care provider. This is important. Contact a health care provider if:  You keep feeling nauseous or you keep vomiting.  You feel light-headed.  You develop a rash.  You have a fever. Get help right away if:  You have trouble breathing. Summary  For several hours after your procedure, you may feel sleepy and have poor judgment.  Have a responsible adult stay with you for at least 24 hours or until you are awake and alert. This information is not intended to replace advice given to you by your health care provider. Make sure you discuss any questions you have with your health care provider. Document Revised: 04/20/2017 Document Reviewed: 05/13/2015 Elsevier Patient Education  [place].

## 2019-04-07 ENCOUNTER — Encounter (HOSPITAL_COMMUNITY)
Admission: RE | Admit: 2019-04-07 | Discharge: 2019-04-07 | Disposition: A | Discharge status: Home / Self Care | Payer: PPO | Source: Ambulatory Visit | Attending: Ophthalmology | Admitting: Ophthalmology

## 2019-04-07 ENCOUNTER — Other Ambulatory Visit (HOSPITAL_COMMUNITY)
Admission: RE | Admit: 2019-04-07 | Discharge: 2019-04-07 | Disposition: A | Discharge status: Home / Self Care | Payer: PPO | Source: Ambulatory Visit | Attending: Ophthalmology | Admitting: Ophthalmology

## 2019-04-07 ENCOUNTER — Other Ambulatory Visit: Payer: Self-pay

## 2019-04-07 ENCOUNTER — Encounter (HOSPITAL_COMMUNITY): Payer: Self-pay

## 2019-04-07 DIAGNOSIS — Z20822 Contact with and (suspected) exposure to covid-19: Secondary | ICD-10-CM | POA: Insufficient documentation

## 2019-04-07 DIAGNOSIS — Z01812 Encounter for preprocedural laboratory examination: Secondary | ICD-10-CM | POA: Insufficient documentation

## 2019-04-07 LAB — BASIC METABOLIC PANEL
Anion gap: 7 (ref 5–15)
BUN: 16 mg/dL (ref 8–23)
CO2: 25 mmol/L (ref 22–32)
Calcium: 9.2 mg/dL (ref 8.9–10.3)
Chloride: 106 mmol/L (ref 98–111)
Creatinine, Ser: 1.6 mg/dL — ABNORMAL HIGH (ref 0.61–1.24)
GFR calc Af Amer: 51 mL/min — ABNORMAL LOW (ref >60)
GFR calc non Af Amer: 44 mL/min — ABNORMAL LOW (ref >60)
Glucose, Bld: 158 mg/dL — ABNORMAL HIGH (ref 70–99)
Potassium: 3.8 mmol/L (ref 3.5–5.1)
Sodium: 138 mmol/L (ref 135–145)

## 2019-04-07 LAB — HEMOGLOBIN A1C
Hgb A1c MFr Bld: 6.8 % — ABNORMAL HIGH (ref 4.8–5.6)
Mean Plasma Glucose: 148.46 mg/dL

## 2019-04-08 LAB — SARS CORONAVIRUS 2 (TAT 6-24 HRS): SARS Coronavirus 2: NEGATIVE

## 2019-04-08 NOTE — Pre-Procedure Instructions (Signed)
HgbA1C routed to PCP. 

## 2019-04-11 ENCOUNTER — Ambulatory Visit (HOSPITAL_COMMUNITY): Payer: PPO | Admitting: Anesthesiology

## 2019-04-11 ENCOUNTER — Encounter (HOSPITAL_COMMUNITY): Payer: Self-pay | Admitting: Ophthalmology

## 2019-04-11 ENCOUNTER — Ambulatory Visit (HOSPITAL_COMMUNITY)
Admission: RE | Admit: 2019-04-11 | Discharge: 2019-04-11 | Disposition: A | Discharge status: Home / Self Care | Payer: PPO | Attending: Ophthalmology | Admitting: Ophthalmology

## 2019-04-11 ENCOUNTER — Encounter (HOSPITAL_COMMUNITY)
Admission: RE | Disposition: A | Discharge status: Home / Self Care | Payer: Self-pay | Source: Home / Self Care | Attending: Ophthalmology

## 2019-04-11 DIAGNOSIS — H42 Glaucoma in diseases classified elsewhere: Secondary | ICD-10-CM | POA: Diagnosis not present

## 2019-04-11 DIAGNOSIS — K219 Gastro-esophageal reflux disease without esophagitis: Secondary | ICD-10-CM | POA: Insufficient documentation

## 2019-04-11 DIAGNOSIS — Z7982 Long term (current) use of aspirin: Secondary | ICD-10-CM | POA: Insufficient documentation

## 2019-04-11 DIAGNOSIS — E1136 Type 2 diabetes mellitus with diabetic cataract: Secondary | ICD-10-CM | POA: Insufficient documentation

## 2019-04-11 DIAGNOSIS — I10 Essential (primary) hypertension: Secondary | ICD-10-CM | POA: Diagnosis not present

## 2019-04-11 DIAGNOSIS — Z79899 Other long term (current) drug therapy: Secondary | ICD-10-CM | POA: Insufficient documentation

## 2019-04-11 DIAGNOSIS — E119 Type 2 diabetes mellitus without complications: Secondary | ICD-10-CM | POA: Diagnosis not present

## 2019-04-11 DIAGNOSIS — Z886 Allergy status to analgesic agent status: Secondary | ICD-10-CM | POA: Insufficient documentation

## 2019-04-11 DIAGNOSIS — Z87891 Personal history of nicotine dependence: Secondary | ICD-10-CM | POA: Insufficient documentation

## 2019-04-11 DIAGNOSIS — H25812 Combined forms of age-related cataract, left eye: Secondary | ICD-10-CM | POA: Diagnosis not present

## 2019-04-11 DIAGNOSIS — N1831 Chronic kidney disease, stage 3a: Secondary | ICD-10-CM | POA: Diagnosis not present

## 2019-04-11 DIAGNOSIS — I129 Hypertensive chronic kidney disease with stage 1 through stage 4 chronic kidney disease, or unspecified chronic kidney disease: Secondary | ICD-10-CM | POA: Diagnosis not present

## 2019-04-11 DIAGNOSIS — Z7984 Long term (current) use of oral hypoglycemic drugs: Secondary | ICD-10-CM | POA: Diagnosis not present

## 2019-04-11 DIAGNOSIS — Z884 Allergy status to anesthetic agent status: Secondary | ICD-10-CM | POA: Insufficient documentation

## 2019-04-11 DIAGNOSIS — Z881 Allergy status to other antibiotic agents status: Secondary | ICD-10-CM | POA: Insufficient documentation

## 2019-04-11 DIAGNOSIS — Z79890 Hormone replacement therapy (postmenopausal): Secondary | ICD-10-CM | POA: Insufficient documentation

## 2019-04-11 DIAGNOSIS — E1139 Type 2 diabetes mellitus with other diabetic ophthalmic complication: Secondary | ICD-10-CM | POA: Diagnosis not present

## 2019-04-11 DIAGNOSIS — Z9889 Other specified postprocedural states: Secondary | ICD-10-CM | POA: Diagnosis not present

## 2019-04-11 HISTORY — PX: CATARACT EXTRACTION W/PHACO: SHX586

## 2019-04-11 LAB — GLUCOSE, CAPILLARY: Glucose-Capillary: 111 mg/dL — ABNORMAL HIGH (ref 70–99)

## 2019-04-11 SURGERY — PHACOEMULSIFICATION, CATARACT, WITH IOL INSERTION
Anesthesia: Monitor Anesthesia Care | Site: Eye | Laterality: Left

## 2019-04-11 MED ORDER — PROVISC 10 MG/ML IO SOLN
INTRAOCULAR | Status: DC | PRN
  Administered 2019-04-11: 0.85 mL via INTRAOCULAR

## 2019-04-11 MED ORDER — LIDOCAINE HCL 3.5 % OP GEL: 1.0000 "application " | Freq: Once | OPHTHALMIC | Status: DC

## 2019-04-11 MED ORDER — LACTATED RINGERS IV SOLN: INTRAVENOUS | Status: DC

## 2019-04-11 MED ORDER — PHENYLEPHRINE HCL 2.5 % OP SOLN
1.0000 [drp] | OPHTHALMIC | Status: AC | PRN
  Administered 2019-04-11 (×3): 1 [drp] via OPHTHALMIC

## 2019-04-11 MED ORDER — SODIUM HYALURONATE 23 MG/ML IO SOLN
INTRAOCULAR | Status: DC | PRN
  Administered 2019-04-11: 0.6 mL via INTRAOCULAR

## 2019-04-11 MED ORDER — BSS IO SOLN
INTRAOCULAR | Status: DC | PRN
  Administered 2019-04-11: 15 mL via INTRAOCULAR

## 2019-04-11 MED ORDER — TETRACAINE HCL 0.5 % OP SOLN
1.0000 [drp] | OPHTHALMIC | Status: AC | PRN
  Administered 2019-04-11 (×3): 1 [drp] via OPHTHALMIC

## 2019-04-11 MED ORDER — EPINEPHRINE PF 1 MG/ML IJ SOLN
INTRAOCULAR | Status: DC | PRN
  Administered 2019-04-11: 500 mL

## 2019-04-11 MED ORDER — TETRACAINE HCL 0.5 % OP SOLN
OPHTHALMIC | Status: DC | PRN
  Administered 2019-04-11: 1 [drp] via OPHTHALMIC

## 2019-04-11 MED ORDER — NEOMYCIN-POLYMYXIN-DEXAMETH 3.5-10000-0.1 OP SUSP
OPHTHALMIC | Status: DC | PRN
  Administered 2019-04-11: 1 [drp] via OPHTHALMIC

## 2019-04-11 MED ORDER — POVIDONE-IODINE 5 % OP SOLN
OPHTHALMIC | Status: DC | PRN
  Administered 2019-04-11: 1 via OPHTHALMIC

## 2019-04-11 MED ORDER — CYCLOPENTOLATE-PHENYLEPHRINE 0.2-1 % OP SOLN
1.0000 [drp] | OPHTHALMIC | Status: AC | PRN
  Administered 2019-04-11 (×3): 1 [drp] via OPHTHALMIC

## 2019-04-11 SURGICAL SUPPLY — 21 items
CLOTH BEACON ORANGE TIMEOUT ST (SAFETY) ×2 IMPLANT
DEVICE MILOOP (MISCELLANEOUS) IMPLANT
EYE SHIELD UNIVERSAL CLEAR (GAUZE/BANDAGES/DRESSINGS) ×2 IMPLANT
GLOVE BIOGEL PI IND STRL 7.0 (GLOVE) IMPLANT
GLOVE BIOGEL PI INDICATOR 7.0 (GLOVE) ×4
LENS IOL ACRSF IQ VT 15 18.5 IMPLANT
LENS IOL ACRYSOF VIVITY 18.5 ×3 IMPLANT
LENS IOL VIVITY 015 18.5 ×1 IMPLANT
MILOOP DEVICE (MISCELLANEOUS)
NDL HYPO 18GX1.5 BLUNT FILL (NEEDLE) IMPLANT
NEEDLE HYPO 18GX1.5 BLUNT FILL (NEEDLE) ×3 IMPLANT
PAD ARMBOARD 7.5X6 YLW CONV (MISCELLANEOUS) ×2 IMPLANT
PROC W SPEC LENS (INTRAOCULAR LENS) ×3
PROCESS W SPEC LENS (INTRAOCULAR LENS) IMPLANT
RING MALYGIN (MISCELLANEOUS) IMPLANT
RING MALYGIN 7.0 (MISCELLANEOUS) IMPLANT
SYR TB 1ML LL NO SAFETY (SYRINGE) ×2 IMPLANT
TAPE SURG TRANSPORE 1 IN (GAUZE/BANDAGES/DRESSINGS) IMPLANT
TAPE SURGICAL TRANSPORE 1 IN (GAUZE/BANDAGES/DRESSINGS) ×3
VISCOELASTIC ADDITIONAL (OPHTHALMIC RELATED) ×2 IMPLANT
WATER STERILE IRR 250ML POUR (IV SOLUTION) ×2 IMPLANT

## 2019-04-11 NOTE — Anesthesia Preprocedure Evaluation (Signed)
Anesthesia Evaluation  Patient identified by MRN, date of birth, ID band Patient awake    Reviewed: Allergy & Precautions, NPO status , Patient's Chart, lab work & pertinent test results, reviewed documented beta blocker date and time   History of Anesthesia Complications (+) PROLONGED EMERGENCE and history of anesthetic complications  Airway Mallampati: II  TM Distance: >3 FB Neck ROM: full    Dental  (+) Partial Lower   Pulmonary pneumonia, resolved, former smoker,    Pulmonary exam normal        Cardiovascular hypertension, Normal cardiovascular exam     Neuro/Psych  Headaches, negative psych ROS   GI/Hepatic Neg liver ROS, GERD  ,  Endo/Other  diabetes, Type 2  Renal/GU Renal InsufficiencyRenal disease  negative genitourinary   Musculoskeletal negative musculoskeletal ROS (+)   Abdominal   Peds negative pediatric ROS (+)  Hematology negative hematology ROS (+)   Anesthesia Other Findings   Reproductive/Obstetrics negative OB ROS                             Anesthesia Physical Anesthesia Plan  ASA: III  Anesthesia Plan:    Post-op Pain Management:    Induction:   PONV Risk Score and Plan: 1 and Treatment may vary due to age or medical condition  Airway Management Planned:   Additional Equipment:   Intra-op Plan:   Post-operative Plan:   Informed Consent: I have reviewed the patients History and Physical, chart, labs and discussed the procedure including the risks, benefits and alternatives for the proposed anesthesia with the patient or authorized representative who has indicated his/her understanding and acceptance.       Plan Discussed with: CRNA  Anesthesia Plan Comments:         Anesthesia Quick Evaluation

## 2019-04-11 NOTE — Anesthesia Postprocedure Evaluation (Signed)
Anesthesia Post Note  Patient: Thomas Malone  Procedure(s) Performed: CATARACT EXTRACTION PHACO AND INTRAOCULAR LENS PLACEMENT (IOC) (CDE:5.80) (Left Eye)  Patient location during evaluation: Short Stay Anesthesia Type: MAC Level of consciousness: awake and alert Pain management: pain level controlled Vital Signs Assessment: post-procedure vital signs reviewed and stable Respiratory status: spontaneous breathing Cardiovascular status: stable Postop Assessment: no apparent nausea or vomiting Anesthetic complications: no     Last Vitals:  Vitals:   04/11/19 0925  BP: (!) 147/94  Resp: 17  Temp: 36.6 C  SpO2: 96%    Last Pain:  Vitals:   04/11/19 0925  TempSrc: Oral  PainSc: 0-No pain                 Everette Rank

## 2019-04-11 NOTE — Discharge Instructions (Signed)
Please discharge patient when stable, will follow up today with Dr. Nataliya Graig at the [facility] office immediately following discharge.  Leave shield in place until visit.  All paperwork with discharge instructions will be given at the office.  [facility] Address:  [address]  [place], [place] [address]  

## 2019-04-11 NOTE — Op Note (Signed)
Date of procedure: 04/11/19  Pre-operative diagnosis: Visually significant age-related combined cataract, Left Eye (H25.812)  Post-operative diagnosis: Visually significant age-related combined cataract, Left Eye (H25.812)  Procedure: Removal of cataract via phacoemulsification and insertion of intra-ocular lens Alcon DFT015  +18.5D into the capsular bag of the Left Eye  Attending surgeon: Gerda Diss. Raju Coppolino, MD, MA  Anesthesia: MAC, Topical Tetracaine  Complications: None  Estimated Blood Loss: <40m (minimal)  Specimens: None  Implants: As above  Indications:  Visually significant age-related cataract, Left Eye  Procedure:  The patient was seen and identified in the pre-operative area. The operative eye was identified and dilated.  The operative eye was marked.  Topical anesthesia was administered to the operative eye.     The patient was then to the operative suite and placed in the supine position.  A timeout was performed confirming the patient, procedure to be performed, and all other relevant information.   The patient's face was prepped and draped in the usual fashion for intra-ocular surgery.  A lid speculum was placed into the operative eye and the surgical microscope moved into place and focused.  An inferotemporal paracentesis was created using a 20 gauge paracentesis blade. Viscoelastic was injected into the anterior chamber.  A temporal clear-corneal main wound incision was created using a 2.460mmicrokeratome.  A continuous curvilinear capsulorrhexis was initiated using an irrigating cystitome and completed using capsulorrhexis forceps.  Hydrodissection and hydrodeliniation were performed.  Viscoelastic was injected into the anterior chamber.  A phacoemulsification handpiece and a chopper as a second instrument were used to remove the nucleus and epinucleus. The irrigation/aspiration handpiece was used to remove any remaining cortical material.   The capsular bag was reinflated  with viscoelastic, checked, and found to be intact.  The intraocular lens was inserted into the capsular bag.  The irrigation/aspiration handpiece was used to remove any remaining viscoelastic.  The clear corneal wound and paracentesis wounds were then hydrated and checked with Weck-Cels to be watertight.  The lid-speculum and drape was removed, and the patient's face was cleaned with a wet and dry 4x4.  Maxitrol was instilled in the eye before a clear shield was taped over the eye. The patient was taken to the post-operative care unit in good condition, having tolerated the procedure well.  Post-Op Instructions: The [facility] and will receive all other orders and instructions.

## 2019-04-11 NOTE — Transfer of Care (Signed)
Immediate Anesthesia Transfer of Care Note  Patient: [name]  Procedure(s) Performed: CATARACT EXTRACTION PHACO AND INTRAOCULAR LENS PLACEMENT (IOC) (CDE:5.80) (Left Eye)  Patient Location: Short Stay  Anesthesia Type:MAC  Level of Consciousness: awake, alert , oriented and patient cooperative  Airway & Oxygen Therapy: Patient Spontanous Breathing  Post-op Assessment: Report given to RN and Post -op Vital signs reviewed and stable  Post vital signs: Reviewed and stable  Last Vitals:  Vitals Value Taken Time  BP    Temp    Pulse    Resp    SpO2      Last Pain:  Vitals:   04/11/19 0925  TempSrc: Oral  PainSc: 0-No pain      Patients Stated Pain Goal: 8 (72/76/18 4859)  Complications: No apparent anesthesia complications

## 2019-04-11 NOTE — Interval H&P Note (Signed)
History and Physical Interval Note: The H and P was reviewed and updated. The patient was examined.  No changes were found after exam.  The surgical eye was marked.  04/11/2019 10:10 AM  Thomas Malone  has presented today for surgery, with the diagnosis of Nuclear sclerotic cataract - Left eye.  The various methods of treatment have been discussed with the patient and family. After consideration of risks, benefits and other options for treatment, the patient has consented to  Procedure(s): CATARACT EXTRACTION PHACO AND INTRAOCULAR LENS PLACEMENT ([place]) (Left) as a surgical intervention.  The patient's history has been reviewed, patient examined, no change in status, stable for surgery.  I have reviewed the patient's chart and labs.  Questions were answered to the patient's satisfaction.     Baruch Goldmann

## 2019-04-20 ENCOUNTER — Other Ambulatory Visit (HOSPITAL_COMMUNITY): Payer: Medicare Other

## 2019-05-17 DIAGNOSIS — B360 Pityriasis versicolor: Secondary | ICD-10-CM | POA: Diagnosis not present

## 2019-05-17 DIAGNOSIS — E1165 Type 2 diabetes mellitus with hyperglycemia: Secondary | ICD-10-CM | POA: Diagnosis not present

## 2019-05-17 DIAGNOSIS — I10 Essential (primary) hypertension: Secondary | ICD-10-CM | POA: Diagnosis not present

## 2019-05-17 DIAGNOSIS — R972 Elevated prostate specific antigen [PSA]: Secondary | ICD-10-CM | POA: Diagnosis not present

## 2019-05-25 NOTE — H&P (Signed)
Surgical History & Physical  Patient Name: Thomas Malone DOB: 1950-10-30  Surgery: Cataract extraction with intraocular lens implant phacoemulsification; Right Eye  Surgeon: Baruch Goldmann MD Surgery Date:  06/03/2019 Pre-Op Date:  05/25/2019  HPI: A 71 Yr. old male patient 1. The patient is returning after cataract post-op. The left eye is affected. Since the last visit, the affected area is doing well. The patient's vision is improved. Patient is following medication instructions. 2. 2. Patient states that the right eye is still blurry. This is especially noticeable now that he has had the left eye done. There is a significant imbalance between the eyes. This is negatively affecting the patient's quality of life. HPI Completed by Dr. Baruch Goldmann  Medical History: Cataracts Glaucoma LASIK Diabetes  Review of Systems High Blood Pressure Negative Allergic/Immunologic Negative Cardiovascular Negative Constitutional Negative Ear, Nose, Mouth & Throat Negative Endocrine Negative Eyes Negative Gastrointestinal Negative Genitourinary Negative Hemotologic/Lymphatic Negative Integumentary Negative Musculoskeletal Negative Neurological Negative Psychiatry Negative Respiratory  Social   Never smoked   Medication Prednisolone-gatiflox-bromfenac,  Benazepril, HCTZ, Metformin, Omeprazole, Cosamin DS, Aspirin, Testosterone, Multivitamin,   Sx/Procedures Lasik, Phaco c IOL OS-multifocal,  Shoulder surgery, Neck surgery,   Drug Allergies  Levofloxacin, Voltaren topical, Lidocaine,  : History & Physical: Heent:  Cataract, Right eye NECK: supple without bruits LUNGS: lungs clear to auscultation CV: regular rate and rhythm Abdomen: soft and non-tender  Impression & Plan: Assessment: 1.  CATARACT EXTRACTION STATUS; Left Eye (Z98.42) 2.  NUCLEAR SCLEROSIS AGE RELATED; Right Eye (H25.11) 3.  Hyperopia ; Right Eye (H52.01)  Plan: 1.  1 week after cataract surgery. Doing well  with improved vision and normal eye pressure. Call with any problems or concerns. Continue Gati-Brom-Pred 2x/day for 3 more weeks. 2.  Cataract accounts for the patient's decreased vision. This visual impairment is not correctable with a tolerable change in glasses or contact lenses. Cataract surgery with an implantation of a new lens should significantly improve the visual and functional status of the patient. Discussed all risks, benefits, alternatives, and potential complications. Discussed the procedures and recovery. Patient desires to have surgery. A-scan ordered and performed today for intra-ocular lens calculations. The surgery will be performed in order to improve vision for driving, reading, and for eye examinations. Recommend phacoemulsification with intra-ocular lens. Right Eye. Surgery required to correct imbalance of vision. Dilates well - shugarcaine by protocol. Standard Lens. Hyperopic LASIK.

## 2019-05-30 DIAGNOSIS — H25811 Combined forms of age-related cataract, right eye: Secondary | ICD-10-CM | POA: Diagnosis not present

## 2019-05-31 ENCOUNTER — Other Ambulatory Visit (HOSPITAL_COMMUNITY)
Admission: RE | Admit: 2019-05-31 | Discharge: 2019-05-31 | Disposition: A | Discharge status: Home / Self Care | Payer: PPO | Source: Ambulatory Visit | Attending: Ophthalmology | Admitting: Ophthalmology

## 2019-05-31 ENCOUNTER — Other Ambulatory Visit: Payer: Self-pay

## 2019-05-31 ENCOUNTER — Encounter (HOSPITAL_COMMUNITY)
Admission: RE | Admit: 2019-05-31 | Discharge: 2019-05-31 | Disposition: A | Discharge status: Home / Self Care | Payer: PPO | Source: Ambulatory Visit | Attending: Ophthalmology | Admitting: Ophthalmology

## 2019-05-31 DIAGNOSIS — Z01812 Encounter for preprocedural laboratory examination: Secondary | ICD-10-CM | POA: Insufficient documentation

## 2019-05-31 DIAGNOSIS — Z20822 Contact with and (suspected) exposure to covid-19: Secondary | ICD-10-CM | POA: Diagnosis not present

## 2019-06-01 LAB — SARS CORONAVIRUS 2 (TAT 6-24 HRS): SARS Coronavirus 2: NEGATIVE

## 2019-06-03 ENCOUNTER — Encounter (HOSPITAL_COMMUNITY)
Admission: RE | Disposition: A | Discharge status: Home / Self Care | Payer: Self-pay | Source: Home / Self Care | Attending: Ophthalmology

## 2019-06-03 ENCOUNTER — Ambulatory Visit (HOSPITAL_COMMUNITY): Payer: PPO | Admitting: Anesthesiology

## 2019-06-03 ENCOUNTER — Ambulatory Visit (HOSPITAL_COMMUNITY)
Admission: RE | Admit: 2019-06-03 | Discharge: 2019-06-03 | Disposition: A | Discharge status: Home / Self Care | Payer: PPO | Attending: Ophthalmology | Admitting: Ophthalmology

## 2019-06-03 ENCOUNTER — Encounter (HOSPITAL_COMMUNITY): Payer: Self-pay | Admitting: Ophthalmology

## 2019-06-03 DIAGNOSIS — E119 Type 2 diabetes mellitus without complications: Secondary | ICD-10-CM | POA: Diagnosis not present

## 2019-06-03 DIAGNOSIS — H5201 Hypermetropia, right eye: Secondary | ICD-10-CM | POA: Insufficient documentation

## 2019-06-03 DIAGNOSIS — H25811 Combined forms of age-related cataract, right eye: Secondary | ICD-10-CM | POA: Insufficient documentation

## 2019-06-03 DIAGNOSIS — I10 Essential (primary) hypertension: Secondary | ICD-10-CM | POA: Insufficient documentation

## 2019-06-03 DIAGNOSIS — E1136 Type 2 diabetes mellitus with diabetic cataract: Secondary | ICD-10-CM | POA: Diagnosis not present

## 2019-06-03 DIAGNOSIS — Z79899 Other long term (current) drug therapy: Secondary | ICD-10-CM | POA: Insufficient documentation

## 2019-06-03 DIAGNOSIS — Z7984 Long term (current) use of oral hypoglycemic drugs: Secondary | ICD-10-CM | POA: Diagnosis not present

## 2019-06-03 HISTORY — PX: CATARACT EXTRACTION W/PHACO: SHX586

## 2019-06-03 LAB — GLUCOSE, CAPILLARY: Glucose-Capillary: 109 mg/dL — ABNORMAL HIGH (ref 70–99)

## 2019-06-03 SURGERY — PHACOEMULSIFICATION, CATARACT, WITH IOL INSERTION
Anesthesia: Monitor Anesthesia Care | Site: Eye | Laterality: Right

## 2019-06-03 MED ORDER — POVIDONE-IODINE 5 % OP SOLN
OPHTHALMIC | Status: DC | PRN
  Administered 2019-06-03: 1 via OPHTHALMIC

## 2019-06-03 MED ORDER — BSS IO SOLN
INTRAOCULAR | Status: DC | PRN
  Administered 2019-06-03: 15 mL via INTRAOCULAR

## 2019-06-03 MED ORDER — LIDOCAINE HCL 3.5 % OP GEL: 1.0000 "application " | Freq: Once | OPHTHALMIC | Status: DC

## 2019-06-03 MED ORDER — NEOMYCIN-POLYMYXIN-DEXAMETH 3.5-10000-0.1 OP SUSP
OPHTHALMIC | Status: DC | PRN
  Administered 2019-06-03: 1 [drp] via OPHTHALMIC

## 2019-06-03 MED ORDER — EPINEPHRINE PF 1 MG/ML IJ SOLN
INTRAMUSCULAR | Status: DC | PRN
  Administered 2019-06-03: 1 mL

## 2019-06-03 MED ORDER — EPINEPHRINE PF 1 MG/ML IJ SOLN
INTRAOCULAR | Status: DC | PRN
  Administered 2019-06-03: 08:00:00 500 mL

## 2019-06-03 MED ORDER — PHENYLEPHRINE HCL 2.5 % OP SOLN
1.0000 [drp] | OPHTHALMIC | Status: AC | PRN
  Administered 2019-06-03 (×3): 1 [drp] via OPHTHALMIC

## 2019-06-03 MED ORDER — SODIUM HYALURONATE 23 MG/ML IO SOLN
INTRAOCULAR | Status: DC | PRN
  Administered 2019-06-03: 0.6 mL via INTRAOCULAR

## 2019-06-03 MED ORDER — TETRACAINE 0.5 % OP SOLN OPTIME - NO CHARGE
OPHTHALMIC | Status: DC | PRN
  Administered 2019-06-03 (×2): 3 [drp] via OPHTHALMIC

## 2019-06-03 MED ORDER — TETRACAINE HCL 0.5 % OP SOLN
1.0000 [drp] | OPHTHALMIC | Status: AC | PRN
  Administered 2019-06-03 (×3): 1 [drp] via OPHTHALMIC

## 2019-06-03 MED ORDER — PROVISC 10 MG/ML IO SOLN
INTRAOCULAR | Status: DC | PRN
  Administered 2019-06-03: 0.85 mL via INTRAOCULAR

## 2019-06-03 MED ORDER — CYCLOPENTOLATE-PHENYLEPHRINE 0.2-1 % OP SOLN
1.0000 [drp] | OPHTHALMIC | Status: AC | PRN
  Administered 2019-06-03 (×3): 1 [drp] via OPHTHALMIC

## 2019-06-03 SURGICAL SUPPLY — 13 items
CLOTH BEACON ORANGE TIMEOUT ST (SAFETY) ×2 IMPLANT
EYE SHIELD UNIVERSAL CLEAR (GAUZE/BANDAGES/DRESSINGS) ×2 IMPLANT
GLOVE BIOGEL PI IND STRL 7.0 (GLOVE) IMPLANT
GLOVE BIOGEL PI INDICATOR 7.0 (GLOVE) ×4
LENS ALC ACRYL/TECN (Ophthalmic Related) ×2 IMPLANT
NDL HYPO 18GX1.5 BLUNT FILL (NEEDLE) IMPLANT
NEEDLE HYPO 18GX1.5 BLUNT FILL (NEEDLE) ×3 IMPLANT
PAD ARMBOARD 7.5X6 YLW CONV (MISCELLANEOUS) ×2 IMPLANT
SYR TB 1ML LL NO SAFETY (SYRINGE) ×2 IMPLANT
TAPE SURG TRANSPORE 1 IN (GAUZE/BANDAGES/DRESSINGS) IMPLANT
TAPE SURGICAL TRANSPORE 1 IN (GAUZE/BANDAGES/DRESSINGS) ×3
VISCOELASTIC ADDITIONAL (OPHTHALMIC RELATED) ×2 IMPLANT
WATER STERILE IRR 250ML POUR (IV SOLUTION) ×2 IMPLANT

## 2019-06-03 NOTE — Anesthesia Postprocedure Evaluation (Signed)
Anesthesia Post Note  Patient: Thomas Malone  Procedure(s) Performed: CATARACT EXTRACTION PHACO AND INTRAOCULAR LENS PLACEMENT RIGHT EYE (Right Eye)  Patient location during evaluation: Short Stay Anesthesia Type: MAC Level of consciousness: awake and alert Pain management: pain level controlled Vital Signs Assessment: post-procedure vital signs reviewed and stable Respiratory status: spontaneous breathing Cardiovascular status: blood pressure returned to baseline Postop Assessment: no apparent nausea or vomiting Anesthetic complications: no     Last Vitals:  Vitals:   06/03/19 0710  BP: 138/85  Pulse: 61  Resp: 16  Temp: 36.8 C  SpO2: 100%    Last Pain:  Vitals:   06/03/19 0710  TempSrc: Oral  PainSc: 0-No pain                 Everette Rank

## 2019-06-03 NOTE — Interval H&P Note (Signed)
History and Physical Interval Note: The H and P was reviewed and updated. The patient was examined.  No changes were found after exam.  The surgical eye was marked.  06/03/2019 7:54 AM  Thomas Malone  has presented today for surgery, with the diagnosis of Nuclear sclerotic cataract - Right eye.  The various methods of treatment have been discussed with the patient and family. After consideration of risks, benefits and other options for treatment, the patient has consented to  Procedure(s) with comments: CATARACT EXTRACTION PHACO AND INTRAOCULAR LENS PLACEMENT RIGHT EYE (Right) - right as a surgical intervention.  The patient's history has been reviewed, patient examined, no change in status, stable for surgery.  I have reviewed the patient's chart and labs.  Questions were answered to the patient's satisfaction.     Baruch Goldmann

## 2019-06-03 NOTE — Op Note (Addendum)
Date of procedure: 06/03/19  Pre-operative diagnosis:  Visually significant combined form age-related cataract, Right Eye (H25.811)  Post-operative diagnosis:  Visually significant combined form age-related cataract, Right Eye (H25.811)  Procedure: Removal of cataract via phacoemulsification and insertion of intra-ocular lens Wynetta Emery and [place]  +19.5D into the capsular bag of the Right Eye  Attending surgeon: Gerda Diss. Jun Rightmyer, MD, MA  Anesthesia: MAC, Topical Tetracaine  Complications: None  Estimated Blood Loss: <98m (minimal)  Specimens: None  Implants: As above  Indications:  Visually significant age-related cataract, Right Eye  Procedure:  The patient was seen and identified in the pre-operative area. The operative eye was identified and dilated.  The operative eye was marked.  Topical anesthesia was administered to the operative eye.     The patient was then to the operative suite and placed in the supine position.  A timeout was performed confirming the patient, procedure to be performed, and all other relevant information.   The patient's face was prepped and draped in the usual fashion for intra-ocular surgery.  A lid speculum was placed into the operative eye and the surgical microscope moved into place and focused.  A superotemporal paracentesis was created using a 20 gauge paracentesis blade.  BSS with Epinephrine was injected into the anterior chamber.  Viscoelastic was injected into the anterior chamber.  A temporal clear-corneal main wound incision was created using a 2.433mmicrokeratome.  A continuous curvilinear capsulorrhexis was initiated using an irrigating cystitome and completed using capsulorrhexis forceps.  Hydrodissection and hydrodeliniation were performed.  Viscoelastic was injected into the anterior chamber.  A phacoemulsification handpiece and a chopper as a second instrument were used to remove the nucleus and epinucleus. The irrigation/aspiration  handpiece was used to remove any remaining cortical material.   The capsular bag was reinflated with viscoelastic, checked, and found to be intact.  The intraocular lens was inserted into the capsular bag.  The irrigation/aspiration handpiece was used to remove any remaining viscoelastic.  The clear corneal wound and paracentesis wounds were then hydrated and checked with Weck-Cels to be watertight.  The lid-speculum was removed.  The drape was removed.  The patient's face was cleaned with a wet and dry 4x4.   Maxitrol was instilled in the eye before a clear shield was taped over the eye. The patient was taken to the post-operative care unit in good condition, having tolerated the procedure well.  Post-Op Instructions: [facility] and will receive all other orders and instructions.

## 2019-06-03 NOTE — Anesthesia Preprocedure Evaluation (Addendum)
Anesthesia Evaluation  Patient identified by MRN, date of birth, ID band Patient awake    Reviewed: Allergy & Precautions, NPO status , Patient's Chart, lab work & pertinent test results  History of Anesthesia Complications (+) PROLONGED EMERGENCE and history of anesthetic complications  Airway Mallampati: II  TM Distance: >3 FB Neck ROM: full   Comment: ACDF Dental  (+) Partial Lower, Missing,    Pulmonary shortness of breath (sarcoidosis ) and with exertion, pneumonia, resolved, former smoker,  Sarcoidosis, as per patient disease is stable last 20 years, SOB on exertion.   Pulmonary exam normal breath sounds clear to auscultation       Cardiovascular METS: 3 - Mets hypertension, Pt. on medications Normal cardiovascular exam Rhythm:Regular Rate:Normal  30-May-2018 18:44:51 [facility] ROUTINE RECORD Sinus rhythm Nonspecific T wave abnormality Confirmed by Lajean Saver [phone]) on 05/30/2018 6:50:57 PM   Neuro/Psych  Headaches, negative psych ROS   GI/Hepatic Neg liver ROS, GERD  Medicated and Controlled,  Endo/Other  diabetes, Well Controlled, Type 2, Oral Hypoglycemic Agents  Renal/GU Renal InsufficiencyRenal disease  negative genitourinary   Musculoskeletal ACDF   Abdominal   Peds negative pediatric ROS (+)  Hematology negative hematology ROS (+)   Anesthesia Other Findings   Reproductive/Obstetrics negative OB ROS                           Anesthesia Physical  Anesthesia Plan  ASA: III  Anesthesia Plan: MAC   Post-op Pain Management:    Induction:   PONV Risk Score and Plan: 1 and Treatment may vary due to age or medical condition  Airway Management Planned: Nasal Cannula and Natural Airway  Additional Equipment:   Intra-op Plan:   Post-operative Plan:   Informed Consent: I have reviewed the patients History and Physical, chart, labs and discussed the  procedure including the risks, benefits and alternatives for the proposed anesthesia with the patient or authorized representative who has indicated his/her understanding and acceptance.     Dental advisory given  Plan Discussed with: CRNA and Surgeon  Anesthesia Plan Comments:         Anesthesia Quick Evaluation

## 2019-06-03 NOTE — Transfer of Care (Signed)
Immediate Anesthesia Transfer of Care Note  Patient: Thomas Malone  Procedure(s) Performed: CATARACT EXTRACTION PHACO AND INTRAOCULAR LENS PLACEMENT RIGHT EYE (Right Eye)  Patient Location: Short Stay  Anesthesia Type:MAC  Level of Consciousness: awake, alert , oriented and patient cooperative  Airway & Oxygen Therapy: Patient Spontanous Breathing  Post-op Assessment: Report given to RN and Post -op Vital signs reviewed and stable  Post vital signs: Reviewed and stable  Last Vitals:  Vitals Value Taken Time  BP    Temp    Pulse    Resp    SpO2      Last Pain:  Vitals:   06/03/19 0710  TempSrc: Oral  PainSc: 0-No pain         Complications: No apparent anesthesia complications

## 2019-06-03 NOTE — Discharge Instructions (Signed)
Please discharge patient when stable, will follow up today with Dr. Neil Brickell at the [facility] [place] office immediately following discharge.  Leave shield in place until visit.  All paperwork with discharge instructions will be given at the office.  [facility] [place] Address:  [address]  [place],  [address]  

## 2019-06-22 ENCOUNTER — Other Ambulatory Visit: Payer: Self-pay

## 2019-06-22 ENCOUNTER — Encounter: Payer: Self-pay | Admitting: Urology

## 2019-06-22 ENCOUNTER — Ambulatory Visit: Payer: PPO | Admitting: Urology

## 2019-06-22 ENCOUNTER — Other Ambulatory Visit: Payer: Self-pay | Admitting: Urology

## 2019-06-22 VITALS — BP 134/81 | HR 77 | Temp 97.7°F | Ht 70.0 in | Wt 230.0 lb

## 2019-06-22 DIAGNOSIS — R972 Elevated prostate specific antigen [PSA]: Secondary | ICD-10-CM | POA: Insufficient documentation

## 2019-06-22 LAB — POCT URINALYSIS DIPSTICK
Bilirubin, UA: NEGATIVE
Blood, UA: NEGATIVE
Glucose, UA: NEGATIVE
Ketones, UA: NEGATIVE
Leukocytes, UA: NEGATIVE
Nitrite, UA: NEGATIVE
Protein, UA: NEGATIVE
Spec Grav, UA: <=1.005 — AB (ref 1.010–1.025)
Urobilinogen, UA: 0.2 E.U./dL
pH, UA: 6 (ref 5.0–8.0)

## 2019-06-22 MED ORDER — AMBULATORY NON FORMULARY MEDICATION
0.2000 mL | 5 refills | Status: DC | PRN
Start: 2019-06-22 — End: 2019-06-28

## 2019-06-22 NOTE — Patient Instructions (Signed)
Prostate-Specific Antigen Test Why am I having this test? The prostate-specific antigen (PSA) test is a screening test for prostate cancer. It can identify early signs of prostate cancer, which may allow for more effective treatment. Your health care provider may recommend that you have a PSA test starting at age 68 or that you have one earlier or later, depending on your risk factors for prostate cancer. You may also have a PSA test:  To monitor treatment of prostate cancer.  To check whether prostate cancer has returned after treatment.  If you have signs of other conditions that can affect PSA levels, such as: ? An enlarged prostate that is not caused by cancer (benign prostatic hyperplasia, BPH). This condition is very common in older men. ? A prostate infection. What is being tested? This test measures the amount of PSA in your blood. PSA is a protein that is made in the prostate. The prostate naturally produces more PSA as you age, but very high levels may be a sign of a medical condition. What kind of sample is taken?  A blood sample is required for this test. It is usually collected by inserting a needle into a blood vessel or by sticking a finger with a small needle. Blood for this test should be drawn before having an exam of the prostate. How do I prepare for this test? Do not ejaculate starting 24 hours before your test, or as long as told by your health care provider. Tell a health care provider about:  Any allergies you have.  All medicines you are taking, including vitamins, herbs, eye drops, creams, and over-the-counter medicines. This also includes: ? Medicines to assist with hair growth, such as finasteride. ? Any recent exposure to a medicine called diethylstilbestrol.  Any blood disorders you have.  Any recent procedures you have had, especially any procedures involving the prostate or rectum.  Any medical conditions you have.  Any recent urinary tract infections  (UTIs) you have had. How are the results reported? Your test results will be reported as a value that indicates how much PSA is in your blood. This will be given as nanograms of PSA per milliliter of blood (ng/mL). Your health care provider will compare your results to normal ranges that were established after testing a large group of people (reference ranges). Reference ranges may vary among labs and hospitals. PSA levels vary from person to person and generally increase with age. Because of this variation, there is no single PSA value that is considered normal for everyone. Instead, PSA reference ranges are used to describe whether your PSA levels are considered low or high (elevated). Common reference ranges are:  Low: 0-2.5 ng/mL.  Slightly to moderately elevated: 2.6-10.0 ng/mL.  Moderately elevated: 10.0-19.9 ng/mL.  Significantly elevated: 20 ng/mL or greater. Sometimes, the test results may report that a condition is present when it is not present (false-positive result). What do the results mean? A test result that is higher than 4 ng/mL may mean that you are at an increased risk for prostate cancer. However, a PSA test by itself is not enough to diagnose prostate cancer. High PSA levels may also be caused by the natural aging process, prostate infection, or BPH. PSA screening cannot tell you if your PSA is high due to cancer or a different cause. A prostate biopsy is the only way to diagnose prostate cancer. A risk of having the PSA test is diagnosing and treating prostate cancer that would never have caused any   symptoms or problems (overdiagnosis and overtreatment). Talk with your health care provider about what your results mean. Questions to ask your health care provider Ask your health care provider, or the department that is doing the test:  When will my results be ready?  How will I get my results?  What are my treatment options?  What other tests do I need?  What are my  next steps? Summary  The prostate-specific antigen (PSA) test is a screening test for prostate cancer.  Your health care provider may recommend that you have a PSA test starting at age 68 or that you have one earlier or later, depending on your risk factors for prostate cancer.  A test result that is higher than 4 ng/mL may mean that you are at an increased risk for prostate cancer. However, elevated levels can be caused by a number of conditions other than prostate cancer.  Talk with your health care provider about what your results mean. This information is not intended to replace advice given to you by your health care provider. Make sure you discuss any questions you have with your health care provider. Document Revised: 01/02/2017 Document Reviewed: 10/27/2016 Elsevier Patient Education  2020 Elsevier Inc.  

## 2019-06-22 NOTE — Progress Notes (Signed)
Urological Symptom Review  Patient is experiencing the following symptoms: Hard to postpone urination Weak stream Erection problems (male only)   Review of Systems  Gastrointestinal (upper)  : Indigestion/heartburn  Gastrointestinal (lower) : Negative for lower GI symptoms  Constitutional : Negative for symptoms  Skin: Skin rash/lesion Itching  Eyes: Negative for eye symptoms  Ear/Nose/Throat : Negative for Ear/Nose/Throat symptoms  Hematologic/Lymphatic: Negative for Hematologic/Lymphatic symptoms  Cardiovascular : Leg swelling  Respiratory : Shortness of breath  Endocrine: Negative for endocrine symptoms  Musculoskeletal: Joint pain  Neurological: Negative for neurological symptoms  Psychologic: Negative for psychiatric symptoms

## 2019-06-22 NOTE — Progress Notes (Signed)
06/22/2019 11:39 AM   [place] 10-25-1950 [id number]  Referring provider: Lemmie Evens, MD [place],  [place] [address]  Elevated PSA  HPI: [name] is a 69yo here for evaluation of elevated PSA. PSA 11/20/202o was 7.1. Per patient the PSA has been high for 5 years. He underwent prostate biopsy by Dr. Diona Fanti 4 years ago which was normal. PSA at that time was 5.8. He had significant LUTS and is currently on uroxatral. He was on TRT and stopped it in October 2020. The has had issues getting and maintaining an erection for 8 years. He previously tried viagra and cilais without improvement. He was on IM testosterone without improvement   PMH: Past Medical History:  Diagnosis Date  . Abnormal CT of the chest 01/23/14  . BPH (benign prostatic hyperplasia) 05/31/2018  . Complication of anesthesia    hard to wake up   . Diabetes mellitus without complication ([place])   . Elevated PSA   . Erectile dysfunction   . Gastroesophageal reflux   . H/O sarcoidosis   . Headache    7-8 months from gabapentin and naproxen  . Hypertension   . Pneumonia of upper lobe of lung 12/1213    Surgical History: Past Surgical History:  Procedure Laterality Date  . ANTERIOR CERVICAL DECOMP/DISCECTOMY FUSION N/A 05/28/2018  . CATARACT EXTRACTION W/PHACO Left 04/11/2019   Procedure: CATARACT EXTRACTION PHACO AND INTRAOCULAR LENS PLACEMENT (IOC) (CDE:5.80);  Surgeon: Baruch Goldmann, MD;  Location: [facility];  Service: Ophthalmology;  Laterality: Left;  . CATARACT EXTRACTION W/PHACO Right 06/03/2019   Procedure: CATARACT EXTRACTION PHACO AND INTRAOCULAR LENS PLACEMENT RIGHT EYE;  Surgeon: Baruch Goldmann, MD;  Location: [facility];  Service: Ophthalmology;  Laterality: Right;  CDE: 9.82  . COLONOSCOPY    . COLONOSCOPY N/A 06/23/2014   Procedure: COLONOSCOPY;  Surgeon: Aviva Signs Md, MD;  Location: [facility] SU[facility];  Service: Gastroenterology;  Laterality: N/A;  . EYE SURGERY Bilateral    lasic    . ORTHOPEDIC SURGERY Left    heel surgery  . PROSTATE BIOPSY    . SHOULDER ARTHROSCOPY Right    repair of tendons and ligaments  . TONSILLECTOMY      Home Medications:  Allergies as of 06/22/2019      Reactions   Diclofenac Sodium Anaphylaxis   Topical gel   Levaquin [levofloxacin In D5w] Anaphylaxis   Lidocaine Anaphylaxis      Medication List       Accurate as of Jun 22, 2019 11:39 AM. If you have any questions, ask your nurse or doctor.        alfuzosin 10 MG 24 hr tablet Commonly known as: UROXATRAL Take 10 mg by mouth daily with breakfast.   aspirin EC 81 MG tablet Take 81 mg by mouth daily.   benazepril 20 MG tablet Commonly known as: LOTENSIN Take 20 mg by mouth at bedtime.   COSAMIN DS PO Take 1 tablet by mouth daily.   hydrochlorothiazide 25 MG tablet Commonly known as: HYDRODIURIL Take 25 mg by mouth at bedtime.   metFORMIN 500 MG 24 hr tablet Commonly known as: GLUCOPHAGE-XR Take 500 mg by mouth 2 (two) times daily.   multivitamin with minerals Tabs tablet Take 1 tablet by mouth daily.   omeprazole 40 MG capsule Commonly known as: PRILOSEC Take 40 mg by mouth every other day.   tamsulosin 0.4 MG Caps capsule Commonly known as: FLOMAX Take 0.4 mg by mouth daily.  Allergies:  Allergies  Allergen Reactions  . Diclofenac Sodium Anaphylaxis    Topical gel  . Levaquin [Levofloxacin In D5w] Anaphylaxis  . Lidocaine Anaphylaxis    Family History: Family History  Problem Relation Age of Onset  . Cancer Mother   . Heart disease Father   . Hyperlipidemia Sister   . Diabetes Sister   . Heart disease Sister   . Hyperlipidemia Brother   . Diabetes Brother     Social History:  reports that he has quit smoking. His smoking use included cigars. He has never used smokeless tobacco. He reports that he does not drink alcohol or use drugs.  ROS: All other review of systems were reviewed and are negative except what is noted above in  HPI  Physical Exam: BP 134/81   Pulse 77   Temp 97.7 F (36.5 C)   Ht 5\' 10"  (1.778 m)   Wt 230 lb (104.3 kg)   BMI 33.00 kg/m   Constitutional:  Alert and oriented, No acute distress. HEENT: [place] AT, moist mucus membranes.  Trachea midline, no masses. Cardiovascular: No clubbing, cyanosis, or edema. Respiratory: Normal respiratory effort, no increased work of breathing. GI: Abdomen is soft, nontender, nondistended, no abdominal masses GU: No CVA tenderness. Circumcised phallus. No masses/lesions on penis, testis, scrotum. Prostate 60g smooth no nodules no induration.  Lymph: No cervical or inguinal lymphadenopathy. Skin: No rashes, bruises or suspicious lesions. Neurologic: Grossly intact, no focal deficits, moving all 4 extremities. Psychiatric: Normal mood and affect.  Laboratory Data: Lab Results  Component Value Date   WBC 13.4 (H) 05/31/2018   HGB 13.3 05/31/2018   HCT 37.7 (L) 05/31/2018   MCV 79.4 (L) 05/31/2018   PLT 242 05/31/2018    Lab Results  Component Value Date   CREATININE 1.60 (H) 04/07/2019    No results found for: PSA  No results found for: TESTOSTERONE  Lab Results  Component Value Date   HGBA1C 6.8 (H) 04/07/2019    Urinalysis    Component Value Date/Time   COLORURINE STRAW (A) 11/15/2016 0617   APPEARANCEUR CLEAR 11/15/2016 0617   LABSPEC 1.009 11/15/2016 0617   PHURINE 7.0 11/15/2016 0617   GLUCOSEU NEGATIVE 11/15/2016 0617   HGBUR NEGATIVE 11/15/2016 0617   BILIRUBINUR neg 06/22/2019 1104   KETONESUR NEGATIVE 11/15/2016 0617   PROTEINUR Negative 06/22/2019 1104   PROTEINUR NEGATIVE 11/15/2016 0617   UROBILINOGEN 0.2 06/22/2019 1104   NITRITE neg 06/22/2019 1104   NITRITE NEGATIVE 11/15/2016 0617   LEUKOCYTESUR Negative 06/22/2019 1104    No results found for: LABMICR, [place], RBCUA, LABEPIT, MUCUS, BACTERIA  Pertinent Imaging:  No results found for this or any previous visit. No results found for this or any previous  visit. No results found for this or any previous visit. No results found for this or any previous visit. No results found for this or any previous visit. No results found for this or any previous visit. No results found for this or any previous visit. No results found for this or any previous visit.  Assessment & Plan:    1. Elevated PSA -We will recheck PSA today. If it is persistently elevated above 7 then we will proceed with prostate biopsy - POCT urinalysis dipstick  2. Erectile dysfunction -Trimix. I will have him back for Trimix injection teaching.   No follow-ups on file.  Nicolette Bang, MD  [facility] [place]

## 2019-06-23 LAB — PSA: PSA: 6.7 ng/mL — ABNORMAL HIGH (ref <=4.0)

## 2019-06-28 ENCOUNTER — Other Ambulatory Visit: Payer: Self-pay

## 2019-06-28 DIAGNOSIS — N529 Male erectile dysfunction, unspecified: Secondary | ICD-10-CM

## 2019-06-28 MED ORDER — AMBULATORY NON FORMULARY MEDICATION: 0.2000 mL | 5 refills | Status: DC | PRN

## 2019-06-29 ENCOUNTER — Ambulatory Visit: Payer: PPO | Admitting: Urology

## 2019-07-06 ENCOUNTER — Ambulatory Visit: Payer: PPO | Admitting: Urology

## 2019-07-13 ENCOUNTER — Telehealth: Payer: Self-pay

## 2019-07-13 NOTE — Telephone Encounter (Signed)
Pts wife called saying she had went to [place] and that the med for pt was not there. I looked in chart and looks like med was sent to [facility] in [place]. I called they did not have it. I called [facility] and their machine that mixes meds is out. I then called in a verbal order as in the chart originally sent by Dr. Alyson Ingles to the [facility] in [place]. Pharmacist said pt should pick up med on July the 9th because his appt to return to learn trimix teaching is not until July the 12th. Pharmacist stated he would also call pt. I called pt and notified him of all this. Pt wanted to come by office and get contact info on the pharmacy. All info was left up front for pt to get.

## 2019-08-15 ENCOUNTER — Other Ambulatory Visit: Payer: Self-pay

## 2019-08-15 ENCOUNTER — Ambulatory Visit: Payer: PPO | Admitting: Urology

## 2019-08-15 VITALS — BP 125/85 | HR 98 | Temp 98.4°F | Wt 230.0 lb

## 2019-08-15 DIAGNOSIS — R972 Elevated prostate specific antigen [PSA]: Secondary | ICD-10-CM | POA: Diagnosis not present

## 2019-08-15 NOTE — Progress Notes (Signed)
08/15/2019 2:29 PM   Thomas Malone Sep 11, 1950 [id number]  Referring provider: Lemmie Evens, MD [place],  [place] [address]  Erectile dysfunction  HPI: Thomas Malone is a 69yo with erectile dysfunction here for trimix injection teaching   PMH: Past Medical History:  Diagnosis Date  . Abnormal CT of the chest 01/23/14  . BPH (benign prostatic hyperplasia) 05/31/2018  . Complication of anesthesia    hard to wake up   . Diabetes mellitus without complication ([place])   . Elevated PSA   . Erectile dysfunction   . Gastroesophageal reflux   . H/O sarcoidosis   . Headache    7-8 months from gabapentin and naproxen  . Hypertension   . Pneumonia of upper lobe of lung 12/1213    Surgical History: Past Surgical History:  Procedure Laterality Date  . ANTERIOR CERVICAL DECOMP/DISCECTOMY FUSION N/A 05/28/2018  . CATARACT EXTRACTION W/PHACO Left 04/11/2019   Procedure: CATARACT EXTRACTION PHACO AND INTRAOCULAR LENS PLACEMENT (IOC) (CDE:5.80);  Surgeon: Baruch Goldmann, MD;  Location: [facility];  Service: Ophthalmology;  Laterality: Left;  . CATARACT EXTRACTION W/PHACO Right 06/03/2019   Procedure: CATARACT EXTRACTION PHACO AND INTRAOCULAR LENS PLACEMENT RIGHT EYE;  Surgeon: Baruch Goldmann, MD;  Location: [facility];  Service: Ophthalmology;  Laterality: Right;  CDE: 9.82  . COLONOSCOPY    . COLONOSCOPY N/A 06/23/2014   Procedure: COLONOSCOPY;  Surgeon: Aviva Signs Md, MD;  Location: [facility];  Service: Gastroenterology;  Laterality: N/A;  . EYE SURGERY Bilateral    lasic  . ORTHOPEDIC SURGERY Left    heel surgery  . PROSTATE BIOPSY    . SHOULDER ARTHROSCOPY Right    repair of tendons and ligaments  . TONSILLECTOMY      Home Medications:  Allergies as of 08/15/2019      Reactions   Diclofenac Sodium Anaphylaxis   Topical gel   Levaquin [levofloxacin In D5w] Anaphylaxis   Lidocaine Anaphylaxis      Medication List       Accurate as of August 15, 2019  2:29 PM. If  you have any questions, ask your nurse or doctor.        alfuzosin 10 MG 24 hr tablet Commonly known as: UROXATRAL Take 10 mg by mouth daily with breakfast.   AMBULATORY NON FORMULARY MEDICATION 0.2 mLs by Intracavernosal route as needed. Medication Name: Trimix  PGE 67mcg Pap 30mg  Phent 1mg    aspirin EC 81 MG tablet Take 81 mg by mouth daily.   benazepril 20 MG tablet Commonly known as: LOTENSIN Take 20 mg by mouth at bedtime.   COSAMIN DS PO Take 1 tablet by mouth daily.   hydrochlorothiazide 25 MG tablet Commonly known as: HYDRODIURIL Take 25 mg by mouth at bedtime.   metFORMIN 500 MG 24 hr tablet Commonly known as: GLUCOPHAGE-XR Take 500 mg by mouth 2 (two) times daily.   multivitamin with minerals Tabs tablet Take 1 tablet by mouth daily.   omeprazole 40 MG capsule Commonly known as: PRILOSEC Take 40 mg by mouth every other day.   tamsulosin 0.4 MG Caps capsule Commonly known as: FLOMAX Take 0.4 mg by mouth daily.       Allergies:  Allergies  Allergen Reactions  . Diclofenac Sodium Anaphylaxis    Topical gel  . Levaquin [Levofloxacin In D5w] Anaphylaxis  . Lidocaine Anaphylaxis    Family History: Family History  Problem Relation Age of Onset  . Cancer Mother   . Heart disease Father   .  Hyperlipidemia Sister   . Diabetes Sister   . Heart disease Sister   . Hyperlipidemia Brother   . Diabetes Brother     Social History:  reports that he has quit smoking. His smoking use included cigars. He has never used smokeless tobacco. He reports that he does not drink alcohol and does not use drugs.  ROS: All other review of systems were reviewed and are negative except what is noted above in HPI  Physical Exam: BP 125/85   Pulse 98   Temp 98.4 F (36.9 C)   Wt 230 lb (104.3 kg)   BMI 33.00 kg/m   Constitutional:  Alert and oriented, No acute distress. HEENT: [place] AT, moist mucus membranes.  Trachea midline, no masses. Cardiovascular: No  clubbing, cyanosis, or edema. Respiratory: Normal respiratory effort, no increased work of breathing. GI: Abdomen is soft, nontender, nondistended, no abdominal masses GU: No CVA tenderness.  Lymph: No cervical or inguinal lymphadenopathy. Skin: No rashes, bruises or suspicious lesions. Neurologic: Grossly intact, no focal deficits, moving all 4 extremities. Psychiatric: Normal mood and affect.  Laboratory Data: Lab Results  Component Value Date   WBC 13.4 (H) 05/31/2018   HGB 13.3 05/31/2018   HCT 37.7 (L) 05/31/2018   MCV 79.4 (L) 05/31/2018   PLT 242 05/31/2018    Lab Results  Component Value Date   CREATININE 1.60 (H) 04/07/2019    Lab Results  Component Value Date   PSA 6.7 (H) 06/22/2019    No results found for: TESTOSTERONE  Lab Results  Component Value Date   HGBA1C 6.8 (H) 04/07/2019    Urinalysis    Component Value Date/Time   COLORURINE STRAW (A) 11/15/2016 0617   APPEARANCEUR CLEAR 11/15/2016 0617   LABSPEC 1.009 11/15/2016 0617   PHURINE 7.0 11/15/2016 0617   GLUCOSEU NEGATIVE 11/15/2016 0617   HGBUR NEGATIVE 11/15/2016 0617   BILIRUBINUR neg 06/22/2019 1104   KETONESUR NEGATIVE 11/15/2016 0617   PROTEINUR Negative 06/22/2019 1104   PROTEINUR NEGATIVE 11/15/2016 0617   UROBILINOGEN 0.2 06/22/2019 1104   NITRITE neg 06/22/2019 1104   NITRITE NEGATIVE 11/15/2016 0617   LEUKOCYTESUR Negative 06/22/2019 1104    No results found for: LABMICR, [place], RBCUA, LABEPIT, MUCUS, BACTERIA  Pertinent Imaging:  No results found for this or any previous visit.  No results found for this or any previous visit.  No results found for this or any previous visit.  No results found for this or any previous visit.  No results found for this or any previous visit.  No results found for this or any previous visit.  No results found for this or any previous visit.  No results found for this or any previous visit.  Penile injection procedure: Patient  instructed to draw 0.62ml of trimix into the insulin syringe. I then instructed him to inject at the mid penile shaft at either the 3 or 9 o'clock position. I then injected the patient and he achieved a good erection in 20 minutes. Penile injection completed.   Assessment & Plan:    1.Erectile dysfunction -Patient obtained a firm erection with 0.74ml of trimix. Patient was instructed on proper injection technique. He was instructed to increased dose 0.33ml to obtain a firm erection. He was instructed to call our office or present to the ER for an erection lasting more than 4 hours.    Return in about 6 months (around 02/15/2020) for PSA.  Nicolette Bang, MD  [facility] [place]

## 2019-08-15 NOTE — Progress Notes (Signed)

## 2019-08-26 ENCOUNTER — Encounter: Payer: Self-pay | Admitting: Urology

## 2019-08-26 NOTE — Patient Instructions (Signed)
Erectile Dysfunction Erectile dysfunction (ED) is the inability to get or keep an erection in order to have sexual intercourse. Erectile dysfunction may include:  Inability to get an erection.  Lack of enough hardness of the erection to allow penetration.  Loss of the erection before sex is finished. What are the causes? This condition may be caused by:  Certain medicines, such as: ? Pain relievers. ? Antihistamines. ? Antidepressants. ? Blood pressure medicines. ? Water pills (diuretics). ? Ulcer medicines. ? Muscle relaxants. ? Drugs.  Excessive drinking.  Psychological causes, such as: ? Anxiety. ? Depression. ? Sadness. ? Exhaustion. ? Performance fear. ? Stress.  Physical causes, such as: ? Artery problems. This may include diabetes, smoking, liver disease, or atherosclerosis. ? High blood pressure. ? Hormonal problems, such as low testosterone. ? Obesity. ? Nerve problems. This may include back or pelvic injuries, diabetes mellitus, multiple sclerosis, or Parkinson disease. What are the signs or symptoms? Symptoms of this condition include:  Inability to get an erection.  Lack of enough hardness of the erection to allow penetration.  Loss of the erection before sex is finished.  Normal erections at some times, but with frequent unsatisfactory episodes.  Low sexual satisfaction in either partner due to erection problems.  A curved penis occurring with erection. The curve may cause pain or the penis may be too curved to allow for intercourse.  Never having nighttime erections. How is this diagnosed? This condition is often diagnosed by:  Performing a physical exam to find other diseases or specific problems with the penis.  Asking you detailed questions about the problem.  Performing blood tests to check for diabetes mellitus or to measure hormone levels.  Performing other tests to check for underlying health conditions.  Performing an ultrasound  exam to check for scarring.  Performing a test to check blood flow to the penis.  Doing a sleep study at home to measure nighttime erections. How is this treated? This condition may be treated by:  Medicine taken by mouth to help you achieve an erection (oral medicine).  Hormone replacement therapy to replace low testosterone levels.  Medicine that is injected into the penis. Your health care provider may instruct you how to give yourself these injections at home.  Vacuum pump. This is a pump with a ring on it. The pump and ring are placed on the penis and used to create pressure that helps the penis become erect.  Penile implant surgery. In this procedure, you may receive: ? An inflatable implant. This consists of cylinders, a pump, and a reservoir. The cylinders can be inflated with a fluid that helps to create an erection, and they can be deflated after intercourse. ? A semi-rigid implant. This consists of two silicone rubber rods. The rods provide some rigidity. They are also flexible, so the penis can both curve downward in its normal position and become straight for sexual intercourse.  Blood vessel surgery, to improve blood flow to the penis. During this procedure, a blood vessel from a different part of the body is placed into the penis to allow blood to flow around (bypass) damaged or blocked blood vessels.  Lifestyle changes, such as exercising more, losing weight, and quitting smoking. Follow these instructions at home: Medicines   Take over-the-counter and prescription medicines only as told by your health care provider. Do not increase the dosage without first discussing it with your health care provider.  If you are using self-injections, perform injections as directed by your   health care provider. Make sure to avoid any veins that are on the surface of the penis. After giving an injection, apply pressure to the injection site for 5 minutes. General  instructions  Exercise regularly, as directed by your health care provider. Work with your health care provider to lose weight, if needed.  Do not use any products that contain nicotine or tobacco, such as cigarettes and e-cigarettes. If you need help quitting, ask your health care provider.  Before using a vacuum pump, read the instructions that come with the pump and discuss any questions with your health care provider.  Keep all follow-up visits as told by your health care provider. This is important. Contact a health care provider if:  You feel nauseous.  You vomit. Get help right away if:  You are taking oral or injectable medicines and you have an erection that lasts longer than 4 hours. If your health care provider is unavailable, go to the nearest emergency room for evaluation. An erection that lasts much longer than 4 hours can result in permanent damage to your penis.  You have severe pain in your groin or abdomen.  You develop redness or severe swelling of your penis.  You have redness spreading up into your groin or lower abdomen.  You are unable to urinate.  You experience chest pain or a rapid heart beat (palpitations) after taking oral medicines. Summary  Erectile dysfunction (ED) is the inability to get or keep an erection during sexual intercourse. This problem can usually be treated successfully.  This condition is diagnosed based on a physical exam, your symptoms, and tests to determine the cause. Treatment varies depending on the cause, and may include medicines, hormone therapy, surgery, or vacuum pump.  You may need follow-up visits to make sure that you are using your medicines or devices correctly.  Get help right away if you are taking or injecting medicines and you have an erection that lasts longer than 4 hours. This information is not intended to replace advice given to you by your health care provider. Make sure you discuss any questions you have with  your health care provider. Document Revised: 01/02/2017 Document Reviewed: 02/06/2016 Elsevier Patient Education  2020 Elsevier Inc[employer]  

## 2019-09-14 DIAGNOSIS — N183 Chronic kidney disease, stage 3 unspecified: Secondary | ICD-10-CM | POA: Diagnosis not present

## 2019-09-14 DIAGNOSIS — I10 Essential (primary) hypertension: Secondary | ICD-10-CM | POA: Diagnosis not present

## 2019-09-14 DIAGNOSIS — G4700 Insomnia, unspecified: Secondary | ICD-10-CM | POA: Diagnosis not present

## 2019-09-14 DIAGNOSIS — E1165 Type 2 diabetes mellitus with hyperglycemia: Secondary | ICD-10-CM | POA: Diagnosis not present

## 2019-09-19 DIAGNOSIS — Z961 Presence of intraocular lens: Secondary | ICD-10-CM | POA: Diagnosis not present

## 2019-09-19 DIAGNOSIS — H40053 Ocular hypertension, bilateral: Secondary | ICD-10-CM | POA: Diagnosis not present

## 2019-09-19 DIAGNOSIS — H26492 Other secondary cataract, left eye: Secondary | ICD-10-CM | POA: Diagnosis not present

## 2019-10-03 DIAGNOSIS — E1165 Type 2 diabetes mellitus with hyperglycemia: Secondary | ICD-10-CM | POA: Diagnosis not present

## 2019-10-03 DIAGNOSIS — R972 Elevated prostate specific antigen [PSA]: Secondary | ICD-10-CM | POA: Diagnosis not present

## 2019-10-03 DIAGNOSIS — I10 Essential (primary) hypertension: Secondary | ICD-10-CM | POA: Diagnosis not present

## 2019-10-03 DIAGNOSIS — N183 Chronic kidney disease, stage 3 unspecified: Secondary | ICD-10-CM | POA: Diagnosis not present

## 2019-10-03 DIAGNOSIS — G4700 Insomnia, unspecified: Secondary | ICD-10-CM | POA: Diagnosis not present

## 2019-11-19 ENCOUNTER — Emergency Department (HOSPITAL_COMMUNITY)
Admission: EM | Admit: 2019-11-19 | Discharge: 2019-11-19 | Disposition: A | Discharge status: Home / Self Care | Payer: PPO | Attending: Emergency Medicine | Admitting: Emergency Medicine

## 2019-11-19 ENCOUNTER — Other Ambulatory Visit: Payer: Self-pay

## 2019-11-19 DIAGNOSIS — M7989 Other specified soft tissue disorders: Secondary | ICD-10-CM | POA: Diagnosis not present

## 2019-11-19 DIAGNOSIS — N1831 Chronic kidney disease, stage 3a: Secondary | ICD-10-CM | POA: Insufficient documentation

## 2019-11-19 DIAGNOSIS — Z7984 Long term (current) use of oral hypoglycemic drugs: Secondary | ICD-10-CM | POA: Diagnosis not present

## 2019-11-19 DIAGNOSIS — E1122 Type 2 diabetes mellitus with diabetic chronic kidney disease: Secondary | ICD-10-CM | POA: Diagnosis not present

## 2019-11-19 DIAGNOSIS — Z79899 Other long term (current) drug therapy: Secondary | ICD-10-CM | POA: Insufficient documentation

## 2019-11-19 DIAGNOSIS — T7840XA Allergy, unspecified, initial encounter: Secondary | ICD-10-CM | POA: Insufficient documentation

## 2019-11-19 DIAGNOSIS — I129 Hypertensive chronic kidney disease with stage 1 through stage 4 chronic kidney disease, or unspecified chronic kidney disease: Secondary | ICD-10-CM | POA: Diagnosis not present

## 2019-11-19 DIAGNOSIS — Z7982 Long term (current) use of aspirin: Secondary | ICD-10-CM | POA: Diagnosis not present

## 2019-11-19 DIAGNOSIS — Z87891 Personal history of nicotine dependence: Secondary | ICD-10-CM | POA: Insufficient documentation

## 2019-11-19 MED ORDER — DIPHENHYDRAMINE HCL 50 MG/ML IJ SOLN
50.0000 mg | Freq: Once | INTRAMUSCULAR | Status: AC
  Administered 2019-11-19: 50 mg via INTRAMUSCULAR
  Filled 2019-11-19: qty 1

## 2019-11-19 MED ORDER — FAMOTIDINE 20 MG PO TABS
20.0000 mg | ORAL_TABLET | Freq: Two times a day (BID) | ORAL | 0 refills | Status: DC
Start: 2019-11-19 — End: 2021-05-09

## 2019-11-19 MED ORDER — FAMOTIDINE 20 MG PO TABS
20.0000 mg | ORAL_TABLET | Freq: Once | ORAL | Status: AC
  Administered 2019-11-19: 20 mg via ORAL
  Filled 2019-11-19: qty 1

## 2019-11-19 NOTE — ED Provider Notes (Signed)
[facility] Provider Note   CSN: [id number] Arrival date & time: 11/19/19  0016   Time seen 12:55 AM  History Chief Complaint  Patient presents with  . Allergic Reaction    Thomas Malone is a 69 y.o. male.  HPI   Patient states on Monday, October 11 he had surgery done by Dr. Apolonio Schneiders, hand specialist on his right elbow and his right wrist.  He states that he had 2 incisions on his elbow and they moved some ligaments and he had carpal tunnel repair on his right wrist.  This was done as an outpatient.  He states the next day he started having itching and he could feel a rash under his skin but he could not see anything.  He states he called Dr. Lequita Asal office on the 14th and he was told to take Benadryl.  He states he is taking Benadryl 25 mg every 4 hours.  He states however tonight the itching got a lot worse and now he can see a rash on his skin.  He denies any shortness of breath, nausea, vomiting, swelling of his throat or difficulty swallowing or breathing.  He does report allergy to lidocaine and he was told that they inadvertently gave him lidocaine during his surgery.  He states he last took Benadryl 25 mg at 11:30 PM tonight.  Patient states his wife drove him to the ED.  PCP Lemmie Evens, MD   Past Medical History:  Diagnosis Date  . Abnormal CT of the chest 01/23/14  . BPH (benign prostatic hyperplasia) 05/31/2018  . Complication of anesthesia    hard to wake up   . Diabetes mellitus without complication ([place])   . Elevated PSA   . Erectile dysfunction   . Gastroesophageal reflux   . H/O sarcoidosis   . Headache    7-8 months from gabapentin and naproxen  . Hypertension   . Pneumonia of upper lobe of lung 12/1213    Patient Active Problem List   Diagnosis Date Noted  . Elevated PSA 06/22/2019  . Dysphagia 05/31/2018  . Chronic kidney disease, stage 3a ([place]) 05/31/2018  . BPH (benign prostatic hyperplasia) 05/31/2018  . Postoperative  seroma involving digestive system after non-digestive system procedure 05/30/2018  . H/O sarcoidosis   . Hypertension   . Erectile dysfunction     Past Surgical History:  Procedure Laterality Date  . ANTERIOR CERVICAL DECOMP/DISCECTOMY FUSION N/A 05/28/2018  . CATARACT EXTRACTION W/PHACO Left 04/11/2019   Procedure: CATARACT EXTRACTION PHACO AND INTRAOCULAR LENS PLACEMENT (IOC) (CDE:5.80);  Surgeon: Baruch Goldmann, MD;  Location: [facility];  Service: Ophthalmology;  Laterality: Left;  . CATARACT EXTRACTION W/PHACO Right 06/03/2019   Procedure: CATARACT EXTRACTION PHACO AND INTRAOCULAR LENS PLACEMENT RIGHT EYE;  Surgeon: Baruch Goldmann, MD;  Location: [facility];  Service: Ophthalmology;  Laterality: Right;  CDE: 9.82  . COLONOSCOPY    . COLONOSCOPY N/A 06/23/2014   Procedure: COLONOSCOPY;  Surgeon: Aviva Signs Md, MD;  Location: [facility];  Service: Gastroenterology;  Laterality: N/A;  . EYE SURGERY Bilateral    lasic  . ORTHOPEDIC SURGERY Left    heel surgery  . PROSTATE BIOPSY    . SHOULDER ARTHROSCOPY Right    repair of tendons and ligaments  . TONSILLECTOMY         Family History  Problem Relation Age of Onset  . Cancer Mother   . Heart disease Father   . Hyperlipidemia Sister   . Diabetes Sister   .  Heart disease Sister   . Hyperlipidemia Brother   . Diabetes Brother     Social History   Tobacco Use  . Smoking status: Former Smoker    Types: Cigars  . Smokeless tobacco: Never Used  . Tobacco comment: and pipe-none since 2010  Vaping Use  . Vaping Use: Never used  Substance Use Topics  . Alcohol use: No  . Drug use: No    Home Medications Prior to Admission medications   Medication Sig Start Date End Date Taking? Authorizing Provider  alfuzosin (UROXATRAL) 10 MG 24 hr tablet Take 10 mg by mouth daily with breakfast.    [provider]  AMBULATORY NON FORMULARY MEDICATION 0.2 mLs by Intracavernosal route as needed. Medication Name: Trimix  PGE  38mcg Pap 30mg  Phent 1mg  06/28/19   Cleon Gustin, MD  aspirin EC 81 MG tablet Take 81 mg by mouth daily.    [provider]  benazepril (LOTENSIN) 20 MG tablet Take 20 mg by mouth at bedtime.     [provider]  famotidine (PEPCID) 20 MG tablet Take 1 tablet (20 mg total) by mouth 2 (two) times daily. 11/19/19   Rolland Porter, MD  Glucosamine-Chondroitin (COSAMIN DS PO) Take 1 tablet by mouth daily.    [provider]  hydrochlorothiazide (HYDRODIURIL) 25 MG tablet Take 25 mg by mouth at bedtime.     [provider]  metFORMIN (GLUCOPHAGE-XR) 500 MG 24 hr tablet Take 500 mg by mouth 2 (two) times daily.  01/26/16   [provider]  Multiple Vitamin (MULTIVITAMIN WITH MINERALS) TABS tablet Take 1 tablet by mouth daily.    [provider]  omeprazole (PRILOSEC) 40 MG capsule Take 40 mg by mouth every other day.     [provider]  tamsulosin (FLOMAX) 0.4 MG CAPS capsule Take 0.4 mg by mouth daily. 05/17/19   [provider]    Allergies    Diclofenac sodium, Levaquin [levofloxacin in d5w], and Lidocaine  Review of Systems   Review of Systems  All other systems reviewed and are negative.   Physical Exam Updated Vital Signs BP (!) 152/93   Pulse 92   Temp 98.5 F (36.9 C)   Resp 17   Ht 5\' 10"  (1.778 m)   Wt 106.6 kg   SpO2 100%   BMI 33.72 kg/m   Physical Exam Vitals and nursing note reviewed.  Constitutional:      General: He is not in acute distress.    Appearance: Normal appearance. He is not toxic-appearing.  HENT:     Head: Normocephalic and atraumatic.  Eyes:     Extraocular Movements: Extraocular movements intact.     Conjunctiva/sclera: Conjunctivae normal.  Cardiovascular:     Rate and Rhythm: Normal rate.  Pulmonary:     Effort: Pulmonary effort is normal. No respiratory distress.     Breath sounds: Normal breath sounds.  Musculoskeletal:     Cervical back: Normal range of motion.      Comments: Patient has a soft wrap on his right upper extremity.  Skin:    General: Skin is warm and dry.     Findings: Rash present.     Comments: Patient is noted to have a visible rash that is maculopapular and very small, only a few centimeters in size scattered on his extremities, trunk.  Neurological:     General: No focal deficit present.     Mental Status: He is alert.     Cranial  Nerves: No cranial nerve deficit.  Psychiatric:        Mood and Affect: Mood normal.        Behavior: Behavior normal.        Thought Content: Thought content normal.       ED Results / Procedures / Treatments   Labs (all labs ordered are listed, but only abnormal results are displayed) Labs Reviewed - No data to display  EKG None  Radiology No results found.  Procedures Procedures (including critical care time)  Medications Ordered in ED Medications  diphenhydrAMINE (BENADRYL) injection 50 mg (has no administration in time range)  famotidine (PEPCID) tablet 20 mg (has no administration in time range)    ED Course  I have reviewed the triage vital signs and the nursing notes.  Pertinent labs & imaging results that were available during my care of the patient were reviewed by me and considered in my medical decision making (see chart for details).    MDM Rules/Calculators/A&P                           I discussed with the patient we normally give Benadryl, Pepcid, and steroids to treat allergic reaction however with his recent surgery I did not want to use steroids which could delay his healing or increase his risk of infection.  He was advised to use a higher dose of Benadryl and he could also take Zyrtec and Claritin over-the-counter as antihistamine.  He also was started on Pepcid twice a day.  Hopefully that will help with his rash and itching.   Final Clinical Impression(s) / ED Diagnoses Final diagnoses:  Allergic reaction, initial encounter    Rx / DC Orders ED  Discharge Orders         Ordered    famotidine (PEPCID) 20 MG tablet  2 times daily        11/19/19 0103         Plan discharge  Rolland Porter, MD, [place]ette Or, MD 11/19/19 [phone]

## 2019-11-19 NOTE — ED Notes (Signed)
ED Provider at bedside. 

## 2019-11-19 NOTE — Discharge Instructions (Addendum)
Stay cool, heat will make the rash and itching worse.  Start taking Claritin or Zyrtec over-the-counter once a day.  Increase your Benadryl to 50 mg every 4-6 hrs as needed for itching.  Also start taking Pepcid 20 mg twice a day for the itching.  Recheck if you get nausea, vomiting, or you start having difficulty breathing.

## 2019-11-19 NOTE — ED Triage Notes (Addendum)
Pt comes in pov from home with complaints of possible allergic reaction to lidocaine used during his surgery Monday. He said ever since then he has been itching all over. No chest pain no shortness of breath.  Took benadryl at 11:30pm.

## 2019-12-21 ENCOUNTER — Ambulatory Visit: Payer: PPO | Admitting: Urology

## 2020-01-16 DIAGNOSIS — E1165 Type 2 diabetes mellitus with hyperglycemia: Secondary | ICD-10-CM | POA: Diagnosis not present

## 2020-01-16 DIAGNOSIS — G629 Polyneuropathy, unspecified: Secondary | ICD-10-CM | POA: Diagnosis not present

## 2020-01-16 DIAGNOSIS — I10 Essential (primary) hypertension: Secondary | ICD-10-CM | POA: Diagnosis not present

## 2020-01-16 DIAGNOSIS — N50812 Left testicular pain: Secondary | ICD-10-CM | POA: Diagnosis not present

## 2020-01-24 ENCOUNTER — Other Ambulatory Visit: Payer: Self-pay | Admitting: Family Medicine

## 2020-01-24 ENCOUNTER — Other Ambulatory Visit: Payer: Self-pay | Admitting: Nurse Practitioner

## 2020-01-24 DIAGNOSIS — N5082 Scrotal pain: Secondary | ICD-10-CM

## 2020-01-26 ENCOUNTER — Other Ambulatory Visit: Payer: Self-pay

## 2020-01-26 ENCOUNTER — Ambulatory Visit (HOSPITAL_COMMUNITY)
Admission: RE | Admit: 2020-01-26 | Discharge: 2020-01-26 | Disposition: A | Discharge status: Home / Self Care | Payer: PPO | Source: Ambulatory Visit | Attending: Nurse Practitioner | Admitting: Nurse Practitioner

## 2020-01-26 DIAGNOSIS — N442 Benign cyst of testis: Secondary | ICD-10-CM | POA: Diagnosis not present

## 2020-01-26 DIAGNOSIS — N5082 Scrotal pain: Secondary | ICD-10-CM | POA: Diagnosis not present

## 2020-01-26 DIAGNOSIS — N433 Hydrocele, unspecified: Secondary | ICD-10-CM | POA: Diagnosis not present

## 2020-01-26 DIAGNOSIS — N503 Cyst of epididymis: Secondary | ICD-10-CM | POA: Diagnosis not present

## 2020-02-01 DIAGNOSIS — B360 Pityriasis versicolor: Secondary | ICD-10-CM | POA: Diagnosis not present

## 2020-02-08 DIAGNOSIS — I10 Essential (primary) hypertension: Secondary | ICD-10-CM | POA: Diagnosis not present

## 2020-02-08 DIAGNOSIS — G629 Polyneuropathy, unspecified: Secondary | ICD-10-CM | POA: Diagnosis not present

## 2020-02-08 DIAGNOSIS — E1165 Type 2 diabetes mellitus with hyperglycemia: Secondary | ICD-10-CM | POA: Diagnosis not present

## 2020-02-08 DIAGNOSIS — N50812 Left testicular pain: Secondary | ICD-10-CM | POA: Diagnosis not present

## 2020-02-08 DIAGNOSIS — Z79899 Other long term (current) drug therapy: Secondary | ICD-10-CM | POA: Diagnosis not present

## 2020-02-15 ENCOUNTER — Encounter: Payer: Self-pay | Admitting: Urology

## 2020-02-15 ENCOUNTER — Other Ambulatory Visit: Payer: Self-pay

## 2020-02-15 ENCOUNTER — Ambulatory Visit (INDEPENDENT_AMBULATORY_CARE_PROVIDER_SITE_OTHER): Payer: PPO | Admitting: Urology

## 2020-02-15 VITALS — BP 124/81 | HR 92 | Temp 98.7°F | Ht 70.5 in | Wt 230.0 lb

## 2020-02-15 DIAGNOSIS — N50811 Right testicular pain: Secondary | ICD-10-CM

## 2020-02-15 DIAGNOSIS — N50812 Left testicular pain: Secondary | ICD-10-CM | POA: Diagnosis not present

## 2020-02-15 DIAGNOSIS — R972 Elevated prostate specific antigen [PSA]: Secondary | ICD-10-CM | POA: Diagnosis not present

## 2020-02-15 DIAGNOSIS — N529 Male erectile dysfunction, unspecified: Secondary | ICD-10-CM

## 2020-02-15 LAB — URINALYSIS, ROUTINE W REFLEX MICROSCOPIC
Bilirubin, UA: NEGATIVE
Ketones, UA: NEGATIVE
Leukocytes,UA: NEGATIVE
Nitrite, UA: NEGATIVE
Protein,UA: NEGATIVE
RBC, UA: NEGATIVE
Specific Gravity, UA: 1.01 (ref 1.005–1.030)
Urobilinogen, Ur: 0.2 mg/dL (ref 0.2–1.0)
pH, UA: 5 (ref 5.0–7.5)

## 2020-02-15 MED ORDER — ALFUZOSIN HCL ER 10 MG PO TB24: 10.0000 mg | ORAL_TABLET | Freq: Every day | ORAL | 3 refills | Status: DC

## 2020-02-15 MED ORDER — AMBULATORY NON FORMULARY MEDICATION: 0.2000 mL | 5 refills | Status: DC | PRN

## 2020-02-15 MED ORDER — CYCLOBENZAPRINE HCL 5 MG PO TABS: 5.0000 mg | ORAL_TABLET | Freq: Three times a day (TID) | ORAL | 0 refills | Status: DC | PRN

## 2020-02-15 NOTE — Patient Instructions (Signed)
Erectile Dysfunction Erectile dysfunction (ED) is the inability to get or keep an erection in order to have sexual intercourse. ED is considered a symptom of an underlying disorder and not considered a disease. Erectile dysfunction may include:  Inability to get an erection.  Lack of enough hardness of the erection to allow penetration.  Loss of the erection before sex is finished. What are the causes? This condition may be caused by:  Certain medicines, such as: ? Pain relievers. ? Antihistamines. ? Antidepressants. ? Blood pressure medicines. ? Water pills (diuretics). ? Ulcer medicines. ? Muscle relaxants. ? Drugs.  Excessive drinking.  Psychological causes, such as: ? Anxiety. ? Depression. ? Sadness. ? Exhaustion. ? Performance fear. ? Stress.  Physical causes, such as: ? Artery problems. This may include diabetes, smoking, liver disease, or atherosclerosis. ? High blood pressure. ? Hormonal problems, such as low testosterone. ? Obesity. ? Nerve problems. This may include back or pelvic injuries, diabetes mellitus, multiple sclerosis, or Parkinson's disease. What are the signs or symptoms? Symptoms of this condition include:  Inability to get an erection.  Lack of enough hardness of the erection to allow penetration.  Loss of the erection before sex is finished.  Normal erections at some times, but with frequent unsatisfactory episodes.  Low sexual satisfaction in either partner due to erection problems.  A curved penis occurring with erection. The curve may cause pain or the penis may be too curved to allow for intercourse.  Never having nighttime erections. How is this diagnosed? This condition is often diagnosed by:  Performing a physical exam to find other diseases or specific problems with the penis.  Asking you detailed questions about the problem.  Performing blood tests to check for diabetes mellitus or to measure hormone levels.  Performing  other tests to check for underlying health conditions.  Performing an ultrasound exam to check for scarring.  Performing a test to check blood flow to the penis.  Doing a sleep study at home to measure nighttime erections. How is this treated? This condition may be treated by:  Medicine taken by mouth to help you achieve an erection (oral medicine).  Hormone replacement therapy to replace low testosterone levels.  Medicine that is injected into the penis. Your health care provider may instruct you how to give yourself these injections at home.  Vacuum pump. This is a pump with a ring on it. The pump and ring are placed on the penis and used to create pressure that helps the penis become erect.  Penile implant surgery. In this procedure, you may receive: ? An inflatable implant. This consists of cylinders, a pump, and a reservoir. The cylinders can be inflated with a fluid that helps to create an erection, and they can be deflated after intercourse. ? A semi-rigid implant. This consists of two silicone rubber rods. The rods provide some rigidity. They are also flexible, so the penis can both curve downward in its normal position and become straight for sexual intercourse.  Blood vessel surgery, to improve blood flow to the penis. During this procedure, a blood vessel from a different part of the body is placed into the penis to allow blood to flow around (bypass) damaged or blocked blood vessels.  Lifestyle changes, such as exercising more, losing weight, and quitting smoking. Follow these instructions at home: Medicines  Take over-the-counter and prescription medicines only as told by your health care provider. Do not increase the dosage without first discussing it with your health care   provider.  If you are using self-injections, perform injections as directed by your health care provider. Make sure to avoid any veins that are on the surface of the penis. After giving an injection,  apply pressure to the injection site for 5 minutes.   General instructions  Exercise regularly, as directed by your health care provider. Work with your health care provider to lose weight, if needed.  Do not use any products that contain nicotine or tobacco, such as cigarettes and e-cigarettes. If you need help quitting, ask your health care provider.  Before using a vacuum pump, read the instructions that come with the pump and discuss any questions with your health care provider.  Keep all follow-up visits as told by your health care provider. This is important. Contact a health care provider if:  You feel nauseous.  You vomit. Get help right away if:  You are taking oral or injectable medicines and you have an erection that lasts longer than 4 hours. If your health care provider is unavailable, go to the nearest emergency room for evaluation. An erection that lasts much longer than 4 hours can result in permanent damage to your penis.  You have severe pain in your groin or abdomen.  You develop redness or severe swelling of your penis.  You have redness spreading up into your groin or lower abdomen.  You are unable to urinate.  You experience chest pain or a rapid heart beat (palpitations) after taking oral medicines. Summary  Erectile dysfunction (ED) is the inability to get or keep an erection during sexual intercourse. This problem can usually be treated successfully.  This condition is diagnosed based on a physical exam, your symptoms, and tests to determine the cause. Treatment varies depending on the cause and may include medicines, hormone therapy, surgery, or a vacuum pump.  You may need follow-up visits to make sure that you are using your medicines or devices correctly.  Get help right away if you are taking or injecting medicines and you have an erection that lasts longer than 4 hours. This information is not intended to replace advice given to you by your health  care provider. Make sure you discuss any questions you have with your health care provider. Document Revised: 10/07/2019 Document Reviewed: 10/07/2019 Elsevier Patient Education  2021 Elsevier Inc.  

## 2020-02-15 NOTE — Progress Notes (Signed)
Urological Symptom Review  Patient is experiencing the following symptoms: none   Review of Systems  Gastrointestinal (upper)  : Negative for upper GI symptoms  Gastrointestinal (lower) : Negative for lower GI symptoms  Constitutional : Negative for symptoms  Skin: Negative for skin symptoms  Eyes: Negative for eye symptoms  Ear/Nose/Throat : Negative for Ear/Nose/Throat symptoms  Hematologic/Lymphatic: Negative for Hematologic/Lymphatic symptoms  Cardiovascular : Leg swelling  Respiratory : Cough Shortness of breath  Endocrine: Negative for endocrine symptoms  Musculoskeletal: Joint pain  Neurological: Negative for neurological symptoms  Psychologic: Negative for psychiatric symptoms

## 2020-02-15 NOTE — Progress Notes (Signed)
02/15/2020 11:49 AM   Thomas Malone January 02, 1951 [id number]  Referring provider: Lemmie Evens, MD [place],  [place] [address]  followup elevated PSA and ED  HPI: [name] is a 70yo here for followup for elevated PSA and erectile dysfunction. Patient did not get a PSA. NO worsening LUTS. He has erectile dysfunction and has tried viagra and cialis with poor results. He continues to have an inability to get a firm erectile for penetration.  He has bilateral testicular pain that has been present for 3 months. The pain is worse with activity and starts in his lower back and radiates to his testicles. No other exacerbating events. No alleviating events. No other associated symptoms   PMH: Past Medical History:  Diagnosis Date  . Abnormal CT of the chest 01/23/14  . BPH (benign prostatic hyperplasia) 05/31/2018  . Complication of anesthesia    hard to wake up   . Diabetes mellitus without complication ([place])   . Elevated PSA   . Erectile dysfunction   . Gastroesophageal reflux   . H/O sarcoidosis   . Headache    7-8 months from gabapentin and naproxen  . Hypertension   . Pneumonia of upper lobe of lung 12/1213    Surgical History: Past Surgical History:  Procedure Laterality Date  . ANTERIOR CERVICAL DECOMP/DISCECTOMY FUSION N/A 05/28/2018  . CATARACT EXTRACTION W/PHACO Left 04/11/2019   Procedure: CATARACT EXTRACTION PHACO AND INTRAOCULAR LENS PLACEMENT (IOC) (CDE:5.80);  Surgeon: Baruch Goldmann, MD;  Location: [facility];  Service: Ophthalmology;  Laterality: Left;  . CATARACT EXTRACTION W/PHACO Right 06/03/2019   Procedure: CATARACT EXTRACTION PHACO AND INTRAOCULAR LENS PLACEMENT RIGHT EYE;  Surgeon: Baruch Goldmann, MD;  Location: [facility];  Service: Ophthalmology;  Laterality: Right;  CDE: 9.82  . COLONOSCOPY    . COLONOSCOPY N/A 06/23/2014   Procedure: COLONOSCOPY;  Surgeon: Aviva Signs Md, MD;  Location: [facility];  Service: Gastroenterology;  Laterality: N/A;   . EYE SURGERY Bilateral    lasic  . ORTHOPEDIC SURGERY Left    heel surgery  . PROSTATE BIOPSY    . SHOULDER ARTHROSCOPY Right    repair of tendons and ligaments  . TONSILLECTOMY      Home Medications:  Allergies as of 02/15/2020      Reactions   Diclofenac Sodium Anaphylaxis   Topical gel   Levaquin [levofloxacin In D5w] Anaphylaxis   Lidocaine Anaphylaxis      Medication List       Accurate as of February 15, 2020 11:49 AM. If you have any questions, ask your nurse or doctor.        alfuzosin 10 MG 24 hr tablet Commonly known as: UROXATRAL Take 10 mg by mouth daily with breakfast.   AMBULATORY NON FORMULARY MEDICATION 0.2 mLs by Intracavernosal route as needed. Medication Name: Trimix  PGE 10mcg Pap 30mg  Phent 1mg    aspirin EC 81 MG tablet Take 81 mg by mouth daily.   benazepril 20 MG tablet Commonly known as: LOTENSIN Take 20 mg by mouth at bedtime.   COSAMIN DS PO Take 1 tablet by mouth daily.   docusate sodium 100 MG capsule Commonly known as: COLACE Colace 100 mg capsule  Take 1 capsule every day by oral route for 15 days.   EPINEPHrine 0.3 mg/0.3 mL Soaj injection Commonly known as: EPI-PEN Inject into the muscle as directed.   famotidine 20 MG tablet Commonly known as: PEPCID Take 1 tablet (20 mg total) by mouth 2 (two)  times daily.   freestyle lancets 1 each 2 (two) times daily.   FREESTYLE LITE test strip Generic drug: glucose blood SMARTSIG:Via Meter   hydrochlorothiazide 25 MG tablet Commonly known as: HYDRODIURIL Take 25 mg by mouth at bedtime.   ketoconazole 2 % cream Commonly known as: NIZORAL Apply topically 2 (two) times daily as needed.   ketoconazole 2 % shampoo Commonly known as: NIZORAL Apply topically 2 (two) times a week.   loteprednol 0.5 % ophthalmic suspension Commonly known as: LOTEMAX SMARTSIG:In Eye(s)   metFORMIN 500 MG 24 hr tablet Commonly known as: GLUCOPHAGE-XR Take 500 mg by mouth 2 (two) times  daily.   metFORMIN 500 MG tablet Commonly known as: GLUCOPHAGE Take 500 mg by mouth 2 (two) times daily.   multivitamin with minerals Tabs tablet Take 1 tablet by mouth daily.   omeprazole 40 MG capsule Commonly known as: PRILOSEC Take 40 mg by mouth every other day.   oxyCODONE-acetaminophen 5-325 MG tablet Commonly known as: PERCOCET/ROXICET Take 1 tablet by mouth 4 (four) times daily as needed.   tamsulosin 0.4 MG Caps capsule Commonly known as: FLOMAX Take 0.4 mg by mouth daily.       Allergies:  Allergies  Allergen Reactions  . Diclofenac Sodium Anaphylaxis    Topical gel  . Levaquin [Levofloxacin In D5w] Anaphylaxis  . Lidocaine Anaphylaxis    Family History: Family History  Problem Relation Age of Onset  . Cancer Mother   . Heart disease Father   . Hyperlipidemia Sister   . Diabetes Sister   . Heart disease Sister   . Hyperlipidemia Brother   . Diabetes Brother     Social History:  reports that he has quit smoking. His smoking use included cigars. He has never used smokeless tobacco. He reports that he does not drink alcohol and does not use drugs.  ROS: All other review of systems were reviewed and are negative except what is noted above in HPI  Physical Exam: BP 124/81   Pulse 92   Temp 98.7 F (37.1 C)   Ht 5' 10.5" (1.791 m)   Wt 230 lb (104.3 kg)   BMI 32.54 kg/m   Constitutional:  Alert and oriented, No acute distress. HEENT:  AT, moist mucus membranes.  Trachea midline, no masses. Cardiovascular: No clubbing, cyanosis, or edema. Respiratory: Normal respiratory effort, no increased work of breathing. GI: Abdomen is soft, nontender, nondistended, no abdominal masses GU: No CVA tenderness.  Lymph: No cervical or inguinal lymphadenopathy. Skin: No rashes, bruises or suspicious lesions. Neurologic: Grossly intact, no focal deficits, moving all 4 extremities. Psychiatric: Normal mood and affect.  Laboratory Data: Lab Results  Component  Value Date   WBC 13.4 (H) 05/31/2018   HGB 13.3 05/31/2018   HCT 37.7 (L) 05/31/2018   MCV 79.4 (L) 05/31/2018   PLT 242 05/31/2018    Lab Results  Component Value Date   CREATININE 1.60 (H) 04/07/2019    Lab Results  Component Value Date   PSA 6.7 (H) 06/22/2019    No results found for: TESTOSTERONE  Lab Results  Component Value Date   HGBA1C 6.8 (H) 04/07/2019    Urinalysis    Component Value Date/Time   COLORURINE STRAW (A) 11/15/2016 0617   APPEARANCEUR CLEAR 11/15/2016 0617   LABSPEC 1.009 11/15/2016 0617   PHURINE 7.0 11/15/2016 0617   GLUCOSEU NEGATIVE 11/15/2016 0617   HGBUR NEGATIVE 11/15/2016 0617   BILIRUBINUR neg 06/22/2019 1104   [place] NEGATIVE 11/15/2016 0617  PROTEINUR Negative 06/22/2019 1104   PROTEINUR NEGATIVE 11/15/2016 0617   UROBILINOGEN 0.2 06/22/2019 1104   NITRITE neg 06/22/2019 1104   NITRITE NEGATIVE 11/15/2016 0617   LEUKOCYTESUR Negative 06/22/2019 1104    No results found for: LABMICR, [place], RBCUA, LABEPIT, MUCUS, BACTERIA  Pertinent Imaging:  No results found for this or any previous visit.  No results found for this or any previous visit.  No results found for this or any previous visit.  No results found for this or any previous visit.  No results found for this or any previous visit.  No results found for this or any previous visit.  No results found for this or any previous visit.  No results found for this or any previous visit.   Assessment & Plan:    1. Elevated PSA - Urinalysis, Routine w reflex microscopic - PSA - PSA; Future  2. Erectile dysfunction -Continue trimix injections  3. Dorsalgia -flexeril 5mg  TID PRN    No follow-ups on file.  Nicolette Bang, MD  [facility] [place]

## 2020-02-16 LAB — PSA: Prostate Specific Ag, Serum: 7.4 ng/mL — ABNORMAL HIGH (ref 0.0–4.0)

## 2020-02-21 DIAGNOSIS — N50812 Left testicular pain: Secondary | ICD-10-CM | POA: Diagnosis not present

## 2020-02-21 DIAGNOSIS — A6922 Other neurologic disorders in Lyme disease: Secondary | ICD-10-CM | POA: Diagnosis not present

## 2020-02-21 DIAGNOSIS — R7989 Other specified abnormal findings of blood chemistry: Secondary | ICD-10-CM | POA: Diagnosis not present

## 2020-03-12 DIAGNOSIS — N390 Urinary tract infection, site not specified: Secondary | ICD-10-CM | POA: Diagnosis not present

## 2020-03-20 NOTE — Progress Notes (Signed)
Sent via MyChart

## 2020-05-22 DIAGNOSIS — E1165 Type 2 diabetes mellitus with hyperglycemia: Secondary | ICD-10-CM | POA: Diagnosis not present

## 2020-05-22 DIAGNOSIS — R5382 Chronic fatigue, unspecified: Secondary | ICD-10-CM | POA: Diagnosis not present

## 2020-05-22 DIAGNOSIS — N183 Chronic kidney disease, stage 3 unspecified: Secondary | ICD-10-CM | POA: Diagnosis not present

## 2020-05-22 DIAGNOSIS — Z7189 Other specified counseling: Secondary | ICD-10-CM | POA: Diagnosis not present

## 2020-08-08 ENCOUNTER — Other Ambulatory Visit: Payer: PPO

## 2020-08-08 ENCOUNTER — Other Ambulatory Visit: Payer: Self-pay

## 2020-08-08 DIAGNOSIS — R972 Elevated prostate specific antigen [PSA]: Secondary | ICD-10-CM

## 2020-08-09 LAB — PSA: Prostate Specific Ag, Serum: 9.4 ng/mL — ABNORMAL HIGH (ref 0.0–4.0)

## 2020-08-15 ENCOUNTER — Ambulatory Visit (INDEPENDENT_AMBULATORY_CARE_PROVIDER_SITE_OTHER): Payer: PPO | Admitting: Urology

## 2020-08-15 ENCOUNTER — Encounter: Payer: Self-pay | Admitting: Urology

## 2020-08-15 ENCOUNTER — Other Ambulatory Visit: Payer: Self-pay

## 2020-08-15 VITALS — BP 137/85 | HR 72 | Temp 98.7°F | Wt 228.4 lb

## 2020-08-15 DIAGNOSIS — R972 Elevated prostate specific antigen [PSA]: Secondary | ICD-10-CM

## 2020-08-15 DIAGNOSIS — N529 Male erectile dysfunction, unspecified: Secondary | ICD-10-CM | POA: Diagnosis not present

## 2020-08-15 LAB — URINALYSIS, ROUTINE W REFLEX MICROSCOPIC
Bilirubin, UA: NEGATIVE
Glucose, UA: NEGATIVE
Ketones, UA: NEGATIVE
Leukocytes,UA: NEGATIVE
Nitrite, UA: NEGATIVE
Protein,UA: NEGATIVE
RBC, UA: NEGATIVE
Specific Gravity, UA: 1.015 (ref 1.005–1.030)
Urobilinogen, Ur: 0.2 mg/dL (ref 0.2–1.0)
pH, UA: 6 (ref 5.0–7.5)

## 2020-08-15 MED ORDER — DIAZEPAM 10 MG PO TABS: 10.0000 mg | ORAL_TABLET | Freq: Once | ORAL | 0 refills | Status: AC

## 2020-08-15 MED ORDER — SULFAMETHOXAZOLE-TRIMETHOPRIM 800-160 MG PO TABS: 1.0000 | ORAL_TABLET | Freq: Once | ORAL | 0 refills | Status: AC

## 2020-08-15 NOTE — Patient Instructions (Addendum)
[place] [facility] (11th ed., pp. [phone]). Elsevier.">  Transrectal Ultrasound-Guided Prostate Biopsy A transrectal ultrasound-guided prostate biopsy is a procedure to remove samples of prostate tissue for testing. The procedure uses ultrasound images to guide the process of removing the samples. The samples are taken to a lab to bechecked for prostate cancer. This procedure is usually done to evaluate the prostate gland of men who have raised (elevated) levels of prostate-specific antigen (PSA), which can be a sign of prostatecancer. Tell a health care provider about: Any allergies you have. All medicines you are taking, including vitamins, herbs, eye drops, creams, and over-the-counter medicines. Any problems you or family members have had with anesthetic medicines. Any blood disorders you have. Any surgeries you have had. Any medical conditions you have. What are the risks? Generally, this is a safe procedure. However, problems may occur, including: Prostate infection. Bleeding from the rectum. Blood in the urine. Allergic reactions to medicines. Damage to surrounding structures such as blood vessels, organs, or muscles. Difficulty passing urine. Nerve damage. This is usually temporary. Pain. What happens before the procedure? Eating and drinking restrictions Follow instructions from your health care provider about eating and drinking. In most instances, you will not need to stop eating and drinking completelybefore the procedure. Medicines Ask your health care provider about: Changing or stopping your regular medicines. This is especially important if you are taking diabetes medicines or blood thinners. Taking medicines such as aspirin and ibuprofen. These medicines can thin your blood. Do not take these medicines unless your health care provider tells you to take them. Taking over-the-counter medicines, vitamins, herbs, and supplements. General instructions You will be  given an enema. During an enema, a liquid is injected into your rectum to clear out waste. You may have a blood or urine sample taken. Plan to have a responsible adult take you home from the hospital or clinic. If you will be going home right after the procedure, plan to have a responsible adult care for you for the time you are told. This is important. Ask your health care provider what steps will be taken to help prevent infection. These steps may include: Washing skin with a germ-killing soap. Taking antibiotic medicine. What happens during the procedure?  You will be given one or both of the following: A medicine to help you relax (sedative). A medicine to numb the area (local anesthetic). You will be placed on your left side, and your knees may be bent. A probe with lubricated gel will be placed into your rectum, and images will be taken of your prostate and surrounding structures. Numbing medicine will be injected into your prostate. A biopsy needle will be inserted through your rectum and guided to your prostate using the ultrasound images. Prostate tissue samples will be removed, and the needle will then be removed. The biopsy samples will be sent to a lab to be tested. The procedure may vary among health care providers and hospitals. What happens after the procedure? Your blood pressure, heart rate, breathing rate, and blood oxygen level will be monitored until the medicines you were given have worn off. You may have some discomfort in the rectal area. You will be given pain medicine as needed. Do not drive for 24 hours if you received a sedative. Summary A transrectal ultrasound-guided biopsy removes samples of tissue from your prostate. This procedure is usually done to evaluate the prostate gland of men who have raised (elevated) levels of prostate-specific antigen (PSA), which can be a sign  of prostate cancer. After your procedure, you may feel some discomfort in the rectal  area. Plan to have a responsible adult take you home from the hospital or clinic. This information is not intended to replace advice given to you by your health care provider. Make sure you discuss any questions you have with your healthcare provider. Document Revised: 11/04/2019 Document Reviewed: 10/06/2019 Elsevier Patient Education  [place].         Appointment Time: 12:30 Please arrive by 12:15 Appointment Date:  09/12/20  Location: [facility]   Prostate Biopsy Instructions  Stop all aspirin or blood thinners (aspirin, plavix, coumadin, warfarin, motrin, ibuprofen, advil, aleve, naproxen, naprosyn) for 7 days prior to the procedure.  If you have any questions about stopping these medications, please contact your primary care physician or cardiologist.  Having a light meal prior to the procedure is recommended.  If you are diabetic or have low blood sugar please bring a small snack or glucose tablet.  A Fleets enema is needed to be purchased over the counter at a local pharmacy and used 2 hours before you scheduled appointment.  This can be purchased over the counter at any pharmacy.  Antibiotics will be administered in the clinic at the time of the procedure and 1 tablet has been sent to your pharmacy. Please take the antibiotic as prescribed.    Please bring someone with you to the procedure to drive you home if you are given a valium to take prior to your procedure.   If you have any questions or concerns, please feel free to call the office at [phone] or send a Mychart message.    Thank you, [facility]

## 2020-08-15 NOTE — Progress Notes (Signed)
Urological Symptom Review  Patient is experiencing the following symptoms: Weak stream Erection problems (male only)   Review of Systems  Gastrointestinal (upper)  : Negative for upper GI symptoms  Gastrointestinal (lower) : Negative for lower GI symptoms  Constitutional : Fatigue Negative for symptoms  Skin: Negative for skin symptoms  Eyes: Negative for eye symptoms  Ear/Nose/Throat : Negative for Ear/Nose/Throat symptoms  Hematologic/Lymphatic: Negative for Hematologic/Lymphatic symptoms  Cardiovascular : Negative for cardiovascular symptoms  Respiratory : Negative for respiratory symptoms  Endocrine: Negative for endocrine symptoms  Musculoskeletal: Negative for musculoskeletal symptoms  Neurological: Negative for neurological symptoms  Psychologic: Negative for psychiatric symptoms

## 2020-08-15 NOTE — Progress Notes (Signed)
08/15/2020 11:48 AM   [place] 16-May-1950 [id number]  Referring provider: Lemmie Evens, MD [place],  [place] [address]  Followup Elevated PSA and ED   HPI: Mr Huss is a 70yo here for followup for elevated PSa and erectile dysfunction. He uses trimix prn but he has burning with the injection and pain for 2-3 days. He has intermittent pain with the erection. PSA has increased to 9.4 from 7.4. No worsening LUTS   PMH: Past Medical History:  Diagnosis Date   Abnormal CT of the chest 01/23/14   BPH (benign prostatic hyperplasia) 4/40/3474   Complication of anesthesia    hard to wake up    Diabetes mellitus without complication (HCC)    Elevated PSA    Erectile dysfunction    Gastroesophageal reflux    H/O sarcoidosis    Headache    7-8 months from gabapentin and naproxen   Hypertension    Pneumonia of upper lobe of lung 12/1213    Surgical History: Past Surgical History:  Procedure Laterality Date   ANTERIOR CERVICAL DECOMP/DISCECTOMY FUSION N/A 05/28/2018   CATARACT EXTRACTION W/PHACO Left 04/11/2019   Procedure: CATARACT EXTRACTION PHACO AND INTRAOCULAR LENS PLACEMENT (IOC) (CDE:5.80);  Surgeon: Baruch Goldmann, MD;  Location: [facility];  Service: Ophthalmology;  Laterality: Left;   CATARACT EXTRACTION W/PHACO Right 06/03/2019   Procedure: CATARACT EXTRACTION PHACO AND INTRAOCULAR LENS PLACEMENT RIGHT EYE;  Surgeon: Baruch Goldmann, MD;  Location: [facility];  Service: Ophthalmology;  Laterality: Right;  CDE: 9.82   COLONOSCOPY     COLONOSCOPY N/A 06/23/2014   Procedure: COLONOSCOPY;  Surgeon: Aviva Signs Md, MD;  Location: [facility];  Service: Gastroenterology;  Laterality: N/A;   EYE SURGERY Bilateral    lasic   ORTHOPEDIC SURGERY Left    heel surgery   PROSTATE BIOPSY     SHOULDER ARTHROSCOPY Right    repair of tendons and ligaments   TONSILLECTOMY      Home Medications:  Allergies as of 08/15/2020       Reactions   Diclofenac Sodium  Anaphylaxis   Topical gel   Levaquin [levofloxacin In D5w] Anaphylaxis   Lidocaine Anaphylaxis        Medication List        Accurate as of August 15, 2020 11:48 AM. If you have any questions, ask your nurse or doctor.          alfuzosin 10 MG 24 hr tablet Commonly known as: UROXATRAL Take 1 tablet (10 mg total) by mouth daily with breakfast.   AMBULATORY NON FORMULARY MEDICATION 0.2 mLs by Intracavernosal route as needed. Medication Name: Trimix  PGE 39mcg Pap 30mg  Phent 1mg    AMBULATORY NON FORMULARY MEDICATION 0.2 mLs by Intracavernosal route as needed. Medication Name: Trimix  PGE 47mcg Pap 30mg  Phent 1mg    aspirin EC 81 MG tablet Take 81 mg by mouth daily.   benazepril 20 MG tablet Commonly known as: LOTENSIN Take 20 mg by mouth at bedtime.   COSAMIN DS PO Take 1 tablet by mouth daily.   cyclobenzaprine 5 MG tablet Commonly known as: FLEXERIL Take 1 tablet (5 mg total) by mouth 3 (three) times daily as needed for muscle spasms.   docusate sodium 100 MG capsule Commonly known as: COLACE Colace 100 mg capsule  Take 1 capsule every day by oral route for 15 days.   EPINEPHrine 0.3 mg/0.3 mL Soaj injection Commonly known as: EPI-PEN Inject into the muscle as directed.   famotidine  20 MG tablet Commonly known as: PEPCID Take 1 tablet (20 mg total) by mouth 2 (two) times daily.   freestyle lancets 1 each 2 (two) times daily.   FREESTYLE LITE test strip Generic drug: glucose blood SMARTSIG:Via Meter   hydrochlorothiazide 25 MG tablet Commonly known as: HYDRODIURIL Take 25 mg by mouth at bedtime.   ketoconazole 2 % cream Commonly known as: NIZORAL Apply topically 2 (two) times daily as needed.   ketoconazole 2 % shampoo Commonly known as: NIZORAL Apply topically 2 (two) times a week.   loteprednol 0.5 % ophthalmic suspension Commonly known as: LOTEMAX SMARTSIG:In Eye(s)   metFORMIN 500 MG 24 hr tablet Commonly known as:  GLUCOPHAGE-XR Take 500 mg by mouth 2 (two) times daily.   metFORMIN 500 MG tablet Commonly known as: GLUCOPHAGE Take 500 mg by mouth 2 (two) times daily.   multivitamin with minerals Tabs tablet Take 1 tablet by mouth daily.   omeprazole 40 MG capsule Commonly known as: PRILOSEC Take 40 mg by mouth every other day.   oxyCODONE-acetaminophen 5-325 MG tablet Commonly known as: PERCOCET/ROXICET Take 1 tablet by mouth 4 (four) times daily as needed.   tamsulosin 0.4 MG Caps capsule Commonly known as: FLOMAX Take 0.4 mg by mouth daily.        Allergies:  Allergies  Allergen Reactions   Diclofenac Sodium Anaphylaxis    Topical gel   Levaquin [Levofloxacin In D5w] Anaphylaxis   Lidocaine Anaphylaxis    Family History: Family History  Problem Relation Age of Onset   Cancer Mother    Heart disease Father    Hyperlipidemia Sister    Diabetes Sister    Heart disease Sister    Hyperlipidemia Brother    Diabetes Brother     Social History:  reports that he has quit smoking. His smoking use included cigars. He has never used smokeless tobacco. He reports that he does not drink alcohol and does not use drugs.  ROS: All other review of systems were reviewed and are negative except what is noted above in HPI  Physical Exam: BP 137/85   Pulse 72   Temp 98.7 F (37.1 C)   Wt 228 lb 6.4 oz (103.6 kg)   BMI 32.31 kg/m   Constitutional:  Alert and oriented, No acute distress. HEENT: [place] AT, moist mucus membranes.  Trachea midline, no masses. Cardiovascular: No clubbing, cyanosis, or edema. Respiratory: Normal respiratory effort, no increased work of breathing. GI: Abdomen is soft, nontender, nondistended, no abdominal masses GU: No CVA tenderness.  Lymph: No cervical or inguinal lymphadenopathy. Skin: No rashes, bruises or suspicious lesions. Neurologic: Grossly intact, no focal deficits, moving all 4 extremities. Psychiatric: Normal mood and affect.  Laboratory  Data: Lab Results  Component Value Date   WBC 13.4 (H) 05/31/2018   HGB 13.3 05/31/2018   HCT 37.7 (L) 05/31/2018   MCV 79.4 (L) 05/31/2018   PLT 242 05/31/2018    Lab Results  Component Value Date   CREATININE 1.60 (H) 04/07/2019    Lab Results  Component Value Date   PSA 6.7 (H) 06/22/2019    No results found for: TESTOSTERONE  Lab Results  Component Value Date   HGBA1C 6.8 (H) 04/07/2019    Urinalysis    Component Value Date/Time   COLORURINE STRAW (A) 11/15/2016 0617   APPEARANCEUR Clear 02/15/2020 1125   LABSPEC 1.009 11/15/2016 0617   PHURINE 7.0 11/15/2016 0617   GLUCOSEU Trace (A) 02/15/2020 1125   [place] NEGATIVE 11/15/2016 0617  BILIRUBINUR Negative 02/15/2020 1125   KETONESUR NEGATIVE 11/15/2016 0617   PROTEINUR Negative 02/15/2020 1125   PROTEINUR NEGATIVE 11/15/2016 0617   UROBILINOGEN 0.2 06/22/2019 1104   NITRITE Negative 02/15/2020 1125   NITRITE NEGATIVE 11/15/2016 0617   LEUKOCYTESUR Negative 02/15/2020 1125    Lab Results  Component Value Date   LABMICR Comment 02/15/2020    Pertinent Imaging:  No results found for this or any previous visit.  No results found for this or any previous visit.  No results found for this or any previous visit.  No results found for this or any previous visit.  No results found for this or any previous visit.  No results found for this or any previous visit.  No results found for this or any previous visit.  No results found for this or any previous visit.   Assessment & Plan:    1. Elevated PSA -The patient and I talked about etiologies of elevated PSA.  We discussed the possible relationship between elevated PSA, prostate cancer, BPH, prostatitis, and UTI.   Conservative treatment of elevated PSA with watchful waiting was discussed with the patient.  All questions were answered.        All of the risks and benefits along with alternatives to prostate biopsy were discussed with the patient.   The patient gave fully informed consent to proceed with a transrectal ultrasound guided biopsy of the prostate for the evaluation of their evated PSA.  Prostate biopsy instructions and antibiotics were given to the patient.  - Urinalysis, Routine w reflex microscopic   No follow-ups on file.  Nicolette Bang, MD  [facility] [place]

## 2020-08-22 DIAGNOSIS — N183 Chronic kidney disease, stage 3 unspecified: Secondary | ICD-10-CM | POA: Diagnosis not present

## 2020-08-22 DIAGNOSIS — E1165 Type 2 diabetes mellitus with hyperglycemia: Secondary | ICD-10-CM | POA: Diagnosis not present

## 2020-08-22 DIAGNOSIS — M5010 Cervical disc disorder with radiculopathy, unspecified cervical region: Secondary | ICD-10-CM | POA: Diagnosis not present

## 2020-08-22 DIAGNOSIS — I10 Essential (primary) hypertension: Secondary | ICD-10-CM | POA: Diagnosis not present

## 2020-09-03 ENCOUNTER — Telehealth: Payer: Self-pay

## 2020-09-03 NOTE — Telephone Encounter (Signed)
Secure chat sent to SIMPSON, NITASHA to have prostate biopsy rescheduled at [facility]

## 2020-09-03 NOTE — Telephone Encounter (Signed)
Centralized scheduling called to have appt in radiology changed to 08/10 instead of 08/03

## 2020-09-03 NOTE — Telephone Encounter (Signed)
Patient's wife, [name] was calling to let office know she called Forestine Na to cancel [facility] biopsy appt with McKenzie,  Patient has dentist appt that day to have teeth extracted.  They told her to call us.  Looks like he has appt's made by [facility] for biopsy.  Can you double check this?  [name] wants a call back to let her know Wed. appt was cancelled at .Marland KitchenMarland Kitchen432 [phone]

## 2020-09-04 NOTE — Telephone Encounter (Signed)
Spoke with Ginger at centralized scheduling and she stated that only  T. Moshe Cipro was able to make the change to the schedule, she will not be back until tomorrow morning, but would be able to fix the schedule then. Message left for T. Simpson on her voicemail informing of the need to change biopsy date.

## 2020-09-05 ENCOUNTER — Other Ambulatory Visit: Payer: PPO | Admitting: Urology

## 2020-09-05 ENCOUNTER — Other Ambulatory Visit: Payer: Self-pay

## 2020-09-05 ENCOUNTER — Ambulatory Visit (HOSPITAL_COMMUNITY): Payer: PPO

## 2020-09-05 NOTE — Telephone Encounter (Signed)
Appt corrected with scheduling

## 2020-09-12 ENCOUNTER — Encounter (HOSPITAL_COMMUNITY): Payer: Self-pay

## 2020-09-12 ENCOUNTER — Ambulatory Visit (HOSPITAL_COMMUNITY)
Admission: RE | Admit: 2020-09-12 | Discharge: 2020-09-12 | Disposition: A | Discharge status: Home / Self Care | Payer: PPO | Source: Ambulatory Visit | Attending: Urology | Admitting: Urology

## 2020-09-12 ENCOUNTER — Encounter: Payer: Self-pay | Admitting: Urology

## 2020-09-12 ENCOUNTER — Ambulatory Visit: Payer: PPO | Admitting: Urology

## 2020-09-12 ENCOUNTER — Other Ambulatory Visit: Payer: Self-pay

## 2020-09-12 ENCOUNTER — Ambulatory Visit (INDEPENDENT_AMBULATORY_CARE_PROVIDER_SITE_OTHER): Payer: PPO | Admitting: Urology

## 2020-09-12 ENCOUNTER — Other Ambulatory Visit: Payer: Self-pay | Admitting: Urology

## 2020-09-12 ENCOUNTER — Telehealth: Payer: Self-pay

## 2020-09-12 DIAGNOSIS — R972 Elevated prostate specific antigen [PSA]: Secondary | ICD-10-CM | POA: Diagnosis not present

## 2020-09-12 MED ORDER — STERILE WATER FOR INJECTION IJ SOLN
INTRAMUSCULAR | Status: AC
  Administered 2020-09-12: 2.1 mL via INTRAMUSCULAR
  Filled 2020-09-12: qty 10

## 2020-09-12 MED ORDER — CHLOROPROCAINE HCL 2 % IJ SOLN
30.0000 mL | Freq: Once | INTRAMUSCULAR | Status: AC
  Administered 2020-09-12: 10 mL
  Filled 2020-09-12: qty 30

## 2020-09-12 MED ORDER — CEFTRIAXONE SODIUM 1 G IJ SOLR: 1.0000 g | Freq: Once | INTRAMUSCULAR | Status: AC

## 2020-09-12 MED ORDER — CEFTRIAXONE SODIUM 1 G IJ SOLR
INTRAMUSCULAR | Status: AC
  Administered 2020-09-12: 1 g via INTRAMUSCULAR
  Filled 2020-09-12: qty 10

## 2020-09-12 MED ORDER — STERILE WATER FOR INJECTION IJ SOLN: 3.0000 mL | Freq: Once | INTRAMUSCULAR | Status: AC

## 2020-09-12 NOTE — Sedation Documentation (Signed)
PT tolerated prostate biopsy procedure and antibiotic injection well today. Labs obtained and sent for pathology. PT ambulatory at discharge with no acute distress noted and verbalized understanding of discharge instructions. PT to follow up with urologist as scheduled on 09/26/20 at 3:45.

## 2020-09-12 NOTE — Progress Notes (Signed)
Prostate Biopsy Procedure   Informed consent was obtained after discussing risks/benefits of the procedure.  A time out was performed to ensure correct patient identity.  Pre-Procedure: - Last PSA Level:  Lab Results  Component Value Date   PSA 6.7 (H) 06/22/2019   - Gentamicin given prophylactically - Levaquin 500 mg administered PO -Transrectal Ultrasound performed revealing a 99.0 gm prostate -No significant hypoechoic or median lobe noted  Procedure: - Prostate block performed using 10 cc 1% lidocaine and biopsies taken from sextant areas, a total of 12 under ultrasound guidance.  Post-Procedure: - Patient tolerated the procedure well - He was counseled to seek immediate medical attention if experiences any severe pain, significant bleeding, or fevers - Return in one week to discuss biopsy results

## 2020-09-12 NOTE — Patient Instructions (Signed)
Transrectal Ultrasound-Guided Prostate Biopsy, Care After This sheet gives you information about how to care for yourself after your procedure. Your doctor may also give you more specific instructions. If youhave problems or questions, contact your doctor. What can I expect after the procedure? After the procedure, it is common to have: Pain and discomfort in your butt, especially while sitting. Pink-colored pee (urine), due to small amounts of blood in the pee. Burning while peeing (urinating). Blood in your poop (stool). Bleeding from your butt. Blood in your semen. Follow these instructions at home: Medicines Take over-the-counter and prescription medicines only as told by your doctor. If you were prescribed antibiotic medicine, take it as told by your doctor. Do not stop taking the antibiotic even if you start to feel better. Activity  Do not drive for 24 hours if you were given a medicine to help you relax (sedative) during your procedure. Return to your normal activities as told by your doctor. Ask your doctor what activities are safe for you. Ask your doctor when it is okay for you to have sex. Do not lift anything that is heavier than 10 lb (4.5 kg), or the limit that you are told, until your doctor says that it is safe.  General instructions  Drink enough water to keep your pee pale yellow. Watch your pee, poop, and semen for new bleeding or bleeding that gets worse. Keep all follow-up visits as told by your doctor. This is important.  Contact a doctor if you: Have blood clots in your pee or poop. Notice that your pee smells bad or unusual. Have very bad belly pain. Have trouble peeing. Notice that your lower belly feels firm. Have blood in your pee for more than 2 weeks after the procedure. Have blood in your semen for more than 2 months after the procedure. Have problems getting an erection. Feel sick to your stomach (nauseous) or throw up (vomit). Have new or worse  bleeding in your pee, poop, or semen. Get help right away if you: Have a fever or chills. Have bright red pee. Have very bad pain that does not get better with medicine. Cannot pee. Summary After this procedure, it is common to have pain and discomfort around your butt, especially while sitting. You may have blood in your pee and poop. It is common to have blood in your semen for 1-2 months. If you were prescribed antibiotic medicine, take it as told by your doctor. Do not stop taking the antibiotic even if you start to feel better. Get help right away if you have a fever or chills. This information is not intended to replace advice given to you by your health care provider. Make sure you discuss any questions you have with your healthcare provider. Document Revised: 12/05/2019 Document Reviewed: 10/06/2019 Elsevier Patient Education  2022 Elsevier Inc.  

## 2020-09-12 NOTE — Telephone Encounter (Signed)
Patient wife called the office at 4:45pm stating patient unable to void after having prostate biopsy done today at [facility].  Last void time per wife was before leaving for procedure around 11am.  Wife instructed MD not at office and patient would need to go to ER related to possible urinary retention. Wife voiced understanding.

## 2020-09-13 NOTE — Telephone Encounter (Signed)
Patient made aware of negative biopsy results reviewed by Dr. Alyson Ingles. Patient states he woke up from a nap yesterday and was able to void. He states he has not had anymore problems sense. Patient scheduled for a 6 month f/u w/psa.

## 2020-09-26 ENCOUNTER — Ambulatory Visit: Payer: PPO | Admitting: Urology

## 2020-10-16 ENCOUNTER — Other Ambulatory Visit (HOSPITAL_COMMUNITY): Payer: Self-pay | Admitting: Nurse Practitioner

## 2020-10-16 ENCOUNTER — Other Ambulatory Visit (HOSPITAL_COMMUNITY): Payer: Self-pay | Admitting: Otolaryngology

## 2020-10-16 DIAGNOSIS — G629 Polyneuropathy, unspecified: Secondary | ICD-10-CM | POA: Diagnosis not present

## 2020-10-16 DIAGNOSIS — H538 Other visual disturbances: Secondary | ICD-10-CM | POA: Diagnosis not present

## 2020-10-16 DIAGNOSIS — M79661 Pain in right lower leg: Secondary | ICD-10-CM

## 2020-10-16 DIAGNOSIS — M79662 Pain in left lower leg: Secondary | ICD-10-CM

## 2020-10-22 DIAGNOSIS — Z961 Presence of intraocular lens: Secondary | ICD-10-CM | POA: Diagnosis not present

## 2020-10-22 DIAGNOSIS — H1045 Other chronic allergic conjunctivitis: Secondary | ICD-10-CM | POA: Diagnosis not present

## 2020-10-22 DIAGNOSIS — H40053 Ocular hypertension, bilateral: Secondary | ICD-10-CM | POA: Diagnosis not present

## 2020-10-22 DIAGNOSIS — Z48810 Encounter for surgical aftercare following surgery on the sense organs: Secondary | ICD-10-CM | POA: Diagnosis not present

## 2020-10-25 ENCOUNTER — Other Ambulatory Visit: Payer: Self-pay

## 2020-10-25 ENCOUNTER — Ambulatory Visit (HOSPITAL_COMMUNITY)
Admission: RE | Admit: 2020-10-25 | Discharge: 2020-10-25 | Disposition: A | Discharge status: Home / Self Care | Payer: PPO | Source: Ambulatory Visit | Attending: Nurse Practitioner | Admitting: Nurse Practitioner

## 2020-10-25 DIAGNOSIS — M79662 Pain in left lower leg: Secondary | ICD-10-CM | POA: Insufficient documentation

## 2020-10-25 DIAGNOSIS — M79661 Pain in right lower leg: Secondary | ICD-10-CM | POA: Insufficient documentation

## 2020-10-25 DIAGNOSIS — Z79899 Other long term (current) drug therapy: Secondary | ICD-10-CM | POA: Diagnosis not present

## 2020-10-25 DIAGNOSIS — I10 Essential (primary) hypertension: Secondary | ICD-10-CM | POA: Diagnosis not present

## 2020-10-25 DIAGNOSIS — N183 Chronic kidney disease, stage 3 unspecified: Secondary | ICD-10-CM | POA: Diagnosis not present

## 2020-10-25 DIAGNOSIS — M545 Low back pain, unspecified: Secondary | ICD-10-CM | POA: Diagnosis not present

## 2020-10-25 DIAGNOSIS — E1165 Type 2 diabetes mellitus with hyperglycemia: Secondary | ICD-10-CM | POA: Diagnosis not present

## 2020-11-05 DIAGNOSIS — H16223 Keratoconjunctivitis sicca, not specified as Sjogren's, bilateral: Secondary | ICD-10-CM | POA: Diagnosis not present

## 2020-11-05 DIAGNOSIS — Z48810 Encounter for surgical aftercare following surgery on the sense organs: Secondary | ICD-10-CM | POA: Diagnosis not present

## 2020-11-05 DIAGNOSIS — Z961 Presence of intraocular lens: Secondary | ICD-10-CM | POA: Diagnosis not present

## 2020-11-20 DIAGNOSIS — N189 Chronic kidney disease, unspecified: Secondary | ICD-10-CM | POA: Diagnosis not present

## 2020-11-20 DIAGNOSIS — E1165 Type 2 diabetes mellitus with hyperglycemia: Secondary | ICD-10-CM | POA: Diagnosis not present

## 2020-11-20 DIAGNOSIS — I10 Essential (primary) hypertension: Secondary | ICD-10-CM | POA: Diagnosis not present

## 2020-11-20 DIAGNOSIS — K2100 Gastro-esophageal reflux disease with esophagitis, without bleeding: Secondary | ICD-10-CM | POA: Diagnosis not present

## 2020-11-29 DIAGNOSIS — H04123 Dry eye syndrome of bilateral lacrimal glands: Secondary | ICD-10-CM | POA: Diagnosis not present

## 2020-12-12 ENCOUNTER — Other Ambulatory Visit: Payer: Self-pay

## 2020-12-12 DIAGNOSIS — N529 Male erectile dysfunction, unspecified: Secondary | ICD-10-CM

## 2020-12-12 MED ORDER — AMBULATORY NON FORMULARY MEDICATION: 0.3000 mL | 5 refills | Status: DC | PRN

## 2020-12-12 NOTE — Progress Notes (Signed)
Refill request received from [facility] to increase dose per pt.   Dr. Alyson Ingles reviewed and dose increased from 0.93ml to 0.15ml  Rx faxed to [facility]

## 2020-12-13 ENCOUNTER — Other Ambulatory Visit (HOSPITAL_BASED_OUTPATIENT_CLINIC_OR_DEPARTMENT_OTHER): Payer: Self-pay | Admitting: Nephrology

## 2020-12-13 ENCOUNTER — Other Ambulatory Visit (HOSPITAL_COMMUNITY): Payer: Self-pay | Admitting: Nephrology

## 2020-12-13 DIAGNOSIS — Z6832 Body mass index (BMI) 32.0-32.9, adult: Secondary | ICD-10-CM

## 2020-12-13 DIAGNOSIS — D638 Anemia in other chronic diseases classified elsewhere: Secondary | ICD-10-CM

## 2020-12-13 DIAGNOSIS — N189 Chronic kidney disease, unspecified: Secondary | ICD-10-CM | POA: Diagnosis not present

## 2020-12-13 DIAGNOSIS — E1122 Type 2 diabetes mellitus with diabetic chronic kidney disease: Secondary | ICD-10-CM

## 2020-12-13 DIAGNOSIS — I129 Hypertensive chronic kidney disease with stage 1 through stage 4 chronic kidney disease, or unspecified chronic kidney disease: Secondary | ICD-10-CM | POA: Diagnosis not present

## 2020-12-20 DIAGNOSIS — D638 Anemia in other chronic diseases classified elsewhere: Secondary | ICD-10-CM | POA: Diagnosis not present

## 2020-12-20 DIAGNOSIS — Z79899 Other long term (current) drug therapy: Secondary | ICD-10-CM | POA: Diagnosis not present

## 2020-12-20 DIAGNOSIS — E559 Vitamin D deficiency, unspecified: Secondary | ICD-10-CM | POA: Diagnosis not present

## 2020-12-20 DIAGNOSIS — E1122 Type 2 diabetes mellitus with diabetic chronic kidney disease: Secondary | ICD-10-CM | POA: Diagnosis not present

## 2020-12-20 DIAGNOSIS — Z6832 Body mass index (BMI) 32.0-32.9, adult: Secondary | ICD-10-CM | POA: Diagnosis not present

## 2020-12-20 DIAGNOSIS — I129 Hypertensive chronic kidney disease with stage 1 through stage 4 chronic kidney disease, or unspecified chronic kidney disease: Secondary | ICD-10-CM | POA: Diagnosis not present

## 2020-12-20 DIAGNOSIS — N189 Chronic kidney disease, unspecified: Secondary | ICD-10-CM | POA: Diagnosis not present

## 2020-12-24 ENCOUNTER — Ambulatory Visit (HOSPITAL_COMMUNITY)
Admission: RE | Admit: 2020-12-24 | Discharge: 2020-12-24 | Disposition: A | Discharge status: Home / Self Care | Payer: PPO | Source: Ambulatory Visit | Attending: Nephrology | Admitting: Nephrology

## 2020-12-24 ENCOUNTER — Other Ambulatory Visit: Payer: Self-pay

## 2020-12-24 DIAGNOSIS — E1122 Type 2 diabetes mellitus with diabetic chronic kidney disease: Secondary | ICD-10-CM | POA: Insufficient documentation

## 2020-12-24 DIAGNOSIS — D638 Anemia in other chronic diseases classified elsewhere: Secondary | ICD-10-CM | POA: Diagnosis not present

## 2020-12-24 DIAGNOSIS — N281 Cyst of kidney, acquired: Secondary | ICD-10-CM | POA: Diagnosis not present

## 2020-12-24 DIAGNOSIS — Z6832 Body mass index (BMI) 32.0-32.9, adult: Secondary | ICD-10-CM | POA: Diagnosis not present

## 2020-12-24 DIAGNOSIS — I129 Hypertensive chronic kidney disease with stage 1 through stage 4 chronic kidney disease, or unspecified chronic kidney disease: Secondary | ICD-10-CM | POA: Insufficient documentation

## 2020-12-24 DIAGNOSIS — N189 Chronic kidney disease, unspecified: Secondary | ICD-10-CM | POA: Diagnosis not present

## 2020-12-26 DIAGNOSIS — H04123 Dry eye syndrome of bilateral lacrimal glands: Secondary | ICD-10-CM | POA: Diagnosis not present

## 2021-01-11 DIAGNOSIS — E1122 Type 2 diabetes mellitus with diabetic chronic kidney disease: Secondary | ICD-10-CM | POA: Diagnosis not present

## 2021-01-11 DIAGNOSIS — Z6832 Body mass index (BMI) 32.0-32.9, adult: Secondary | ICD-10-CM | POA: Diagnosis not present

## 2021-01-11 DIAGNOSIS — I129 Hypertensive chronic kidney disease with stage 1 through stage 4 chronic kidney disease, or unspecified chronic kidney disease: Secondary | ICD-10-CM | POA: Diagnosis not present

## 2021-01-11 DIAGNOSIS — N189 Chronic kidney disease, unspecified: Secondary | ICD-10-CM | POA: Diagnosis not present

## 2021-01-11 DIAGNOSIS — D472 Monoclonal gammopathy: Secondary | ICD-10-CM | POA: Diagnosis not present

## 2021-01-11 DIAGNOSIS — N281 Cyst of kidney, acquired: Secondary | ICD-10-CM | POA: Diagnosis not present

## 2021-01-14 DIAGNOSIS — L438 Other lichen planus: Secondary | ICD-10-CM | POA: Diagnosis not present

## 2021-02-27 ENCOUNTER — Other Ambulatory Visit: Payer: PPO

## 2021-02-27 ENCOUNTER — Other Ambulatory Visit: Payer: Self-pay

## 2021-02-27 DIAGNOSIS — R972 Elevated prostate specific antigen [PSA]: Secondary | ICD-10-CM

## 2021-02-28 LAB — PSA: Prostate Specific Ag, Serum: 11.9 ng/mL — ABNORMAL HIGH (ref 0.0–4.0)

## 2021-03-06 ENCOUNTER — Ambulatory Visit: Payer: PPO | Admitting: Urology

## 2021-03-06 ENCOUNTER — Encounter: Payer: Self-pay | Admitting: Urology

## 2021-03-06 ENCOUNTER — Other Ambulatory Visit: Payer: Self-pay

## 2021-03-06 VITALS — BP 129/81 | HR 66

## 2021-03-06 DIAGNOSIS — R972 Elevated prostate specific antigen [PSA]: Secondary | ICD-10-CM

## 2021-03-06 DIAGNOSIS — N529 Male erectile dysfunction, unspecified: Secondary | ICD-10-CM | POA: Diagnosis not present

## 2021-03-06 DIAGNOSIS — N401 Enlarged prostate with lower urinary tract symptoms: Secondary | ICD-10-CM | POA: Diagnosis not present

## 2021-03-06 DIAGNOSIS — R351 Nocturia: Secondary | ICD-10-CM | POA: Diagnosis not present

## 2021-03-06 DIAGNOSIS — N138 Other obstructive and reflux uropathy: Secondary | ICD-10-CM

## 2021-03-06 MED ORDER — ALFUZOSIN HCL ER 10 MG PO TB24: 10.0000 mg | ORAL_TABLET | Freq: Every day | ORAL | 3 refills | Status: DC

## 2021-03-06 MED ORDER — ALPROSTADIL (VASODILATOR) 1000 MCG UR PLLT: 1000.0000 ug | PELLET | URETHRAL | 12 refills | Status: DC | PRN

## 2021-03-06 NOTE — Progress Notes (Signed)
Exosomed urine kit sent via [employer]  Tracking number [id number]  Pick up number [id number]

## 2021-03-06 NOTE — Progress Notes (Signed)
03/06/2021 9:21 AM   [place] 1950-06-26 [id number]  Referring provider: Lemmie Evens, MD [place],  [place] [address]  Elevated PSA, ED, and BPH   HPI: [name] is a 71yo here for followup for elevated PSA, BPH and erectile dysfunction. His PSA increased to 11.9 from 9.4 six months ago. He denies any worsening LUTS. IPSS 13 QOl 2 on uroxatral 10mg  qhs. Nocturia 1-3x. Urine stream strong. No straining to urinate. No dysuria or hematuria. He continues to have issues getting a firm erection. He has tried tadalafil, sildenafil, and trimix.    PMH: Past Medical History:  Diagnosis Date   Abnormal CT of the chest 01/23/14   BPH (benign prostatic hyperplasia) 05/28/9561   Complication of anesthesia    hard to wake up    Diabetes mellitus without complication (HCC)    Elevated PSA    Erectile dysfunction    Gastroesophageal reflux    H/O sarcoidosis    Headache    7-8 months from gabapentin and naproxen   Hypertension    Pneumonia of upper lobe of lung 12/1213    Surgical History: Past Surgical History:  Procedure Laterality Date   ANTERIOR CERVICAL DECOMP/DISCECTOMY FUSION N/A 05/28/2018   CATARACT EXTRACTION W/PHACO Left 04/11/2019   Procedure: CATARACT EXTRACTION PHACO AND INTRAOCULAR LENS PLACEMENT (IOC) (CDE:5.80);  Surgeon: Baruch Goldmann, MD;  Location: [facility];  Service: Ophthalmology;  Laterality: Left;   CATARACT EXTRACTION W/PHACO Right 06/03/2019   Procedure: CATARACT EXTRACTION PHACO AND INTRAOCULAR LENS PLACEMENT RIGHT EYE;  Surgeon: Baruch Goldmann, MD;  Location: [facility];  Service: Ophthalmology;  Laterality: Right;  CDE: 9.82   COLONOSCOPY     COLONOSCOPY N/A 06/23/2014   Procedure: COLONOSCOPY;  Surgeon: Aviva Signs Md, MD;  Location: [facility];  Service: Gastroenterology;  Laterality: N/A;   EYE SURGERY Bilateral    lasic   ORTHOPEDIC SURGERY Left    heel surgery   PROSTATE BIOPSY     SHOULDER ARTHROSCOPY Right    repair of tendons  and ligaments   TONSILLECTOMY      Home Medications:  Allergies as of 03/06/2021       Reactions   Diclofenac Anaphylaxis   Diclofenac Sodium Anaphylaxis   Topical gel   Levaquin [levofloxacin In D5w] Anaphylaxis   Lidocaine Anaphylaxis        Medication List        Accurate as of March 06, 2021  9:21 AM. If you have any questions, ask your nurse or doctor.          alfuzosin 10 MG 24 hr tablet Commonly known as: UROXATRAL Take 1 tablet (10 mg total) by mouth daily with breakfast.   AMBULATORY NON FORMULARY MEDICATION 0.3 mLs by Intracavernosal route as needed. Medication Name: Trimix  PGE 49mcg Pap 30mg  Phent 1mg    aspirin EC 81 MG tablet Take 81 mg by mouth daily.   augmented betamethasone dipropionate 0.05 % cream Commonly known as: DIPROLENE-AF Apply topically.   benazepril 20 MG tablet Commonly known as: LOTENSIN Take 20 mg by mouth at bedtime.   COSAMIN DS PO Take 1 tablet by mouth daily.   cyclobenzaprine 5 MG tablet Commonly known as: FLEXERIL Take 1 tablet (5 mg total) by mouth 3 (three) times daily as needed for muscle spasms.   docusate sodium 100 MG capsule Commonly known as: COLACE Colace 100 mg capsule  Take 1 capsule every day by oral route for 15 days.   EPINEPHrine 0.3  mg/0.3 mL Soaj injection Commonly known as: EPI-PEN Inject into the muscle as directed.   famotidine 20 MG tablet Commonly known as: PEPCID Take 1 tablet (20 mg total) by mouth 2 (two) times daily.   freestyle lancets 1 each 2 (two) times daily.   FREESTYLE LITE test strip Generic drug: glucose blood SMARTSIG:Via Meter   hydrochlorothiazide 25 MG tablet Commonly known as: HYDRODIURIL Take 25 mg by mouth at bedtime.   ketoconazole 2 % cream Commonly known as: NIZORAL Apply topically 2 (two) times daily as needed.   ketoconazole 2 % shampoo Commonly known as: NIZORAL Apply topically 2 (two) times a week.   loteprednol 0.5 % ophthalmic  suspension Commonly known as: LOTEMAX SMARTSIG:In Eye(s)   metFORMIN 500 MG 24 hr tablet Commonly known as: GLUCOPHAGE-XR Take 500 mg by mouth 2 (two) times daily.   metFORMIN 500 MG tablet Commonly known as: GLUCOPHAGE Take 500 mg by mouth 2 (two) times daily.   multivitamin with minerals Tabs tablet Take 1 tablet by mouth daily.   omeprazole 40 MG capsule Commonly known as: PRILOSEC Take 40 mg by mouth every other day.   oxyCODONE-acetaminophen 5-325 MG tablet Commonly known as: PERCOCET/ROXICET Take 1 tablet by mouth 4 (four) times daily as needed.   tamsulosin 0.4 MG Caps capsule Commonly known as: FLOMAX Take 0.4 mg by mouth daily.        Allergies:  Allergies  Allergen Reactions   Diclofenac Anaphylaxis   Diclofenac Sodium Anaphylaxis    Topical gel   Levaquin [Levofloxacin In D5w] Anaphylaxis   Lidocaine Anaphylaxis    Family History: Family History  Problem Relation Age of Onset   Cancer Mother    Heart disease Father    Hyperlipidemia Sister    Diabetes Sister    Heart disease Sister    Hyperlipidemia Brother    Diabetes Brother     Social History:  reports that he has quit smoking. His smoking use included cigars. He has never used smokeless tobacco. He reports that he does not drink alcohol and does not use drugs.  ROS: All other review of systems were reviewed and are negative except what is noted above in HPI  Physical Exam: BP 129/81    Pulse 66   Constitutional:  Alert and oriented, No acute distress. HEENT: [place] AT, moist mucus membranes.  Trachea midline, no masses. Cardiovascular: No clubbing, cyanosis, or edema. Respiratory: Normal respiratory effort, no increased work of breathing. GI: Abdomen is soft, nontender, nondistended, no abdominal masses GU: No CVA tenderness.  Lymph: No cervical or inguinal lymphadenopathy. Skin: No rashes, bruises or suspicious lesions. Neurologic: Grossly intact, no focal deficits, moving all 4  extremities. Psychiatric: Normal mood and affect.  Laboratory Data: Lab Results  Component Value Date   WBC 13.4 (H) 05/31/2018   HGB 13.3 05/31/2018   HCT 37.7 (L) 05/31/2018   MCV 79.4 (L) 05/31/2018   PLT 242 05/31/2018    Lab Results  Component Value Date   CREATININE 1.60 (H) 04/07/2019    Lab Results  Component Value Date   PSA 6.7 (H) 06/22/2019    No results found for: TESTOSTERONE  Lab Results  Component Value Date   HGBA1C 6.8 (H) 04/07/2019    Urinalysis    Component Value Date/Time   COLORURINE STRAW (A) 11/15/2016 0617   APPEARANCEUR Clear 08/15/2020 1133   LABSPEC 1.009 11/15/2016 0617   PHURINE 7.0 11/15/2016 0617   GLUCOSEU Negative 08/15/2020 1133   HGBUR NEGATIVE 11/15/2016 0617  BILIRUBINUR Negative 08/15/2020 1133   KETONESUR NEGATIVE 11/15/2016 0617   PROTEINUR Negative 08/15/2020 1133   PROTEINUR NEGATIVE 11/15/2016 0617   UROBILINOGEN 0.2 06/22/2019 1104   NITRITE Negative 08/15/2020 1133   NITRITE NEGATIVE 11/15/2016 0617   LEUKOCYTESUR Negative 08/15/2020 1133    Lab Results  Component Value Date   LABMICR Comment 08/15/2020    Pertinent Imaging:  No results found for this or any previous visit.  No results found for this or any previous visit.  No results found for this or any previous visit.  No results found for this or any previous visit.  Results for orders placed during the hospital encounter of 12/24/20  US RENAL  Narrative CLINICAL DATA:  Chronic kidney disease.  EXAM: RENAL / URINARY TRACT ULTRASOUND COMPLETE  COMPARISON:  None.  FINDINGS: Right Kidney:  Renal measurements: 11.5 x 6.1 x 5.2 cm = volume: 188 mL. There is diffuse increased renal parenchymal echogenicity. No hydronephrosis or shadowing stone. There is a 5 cm upper pole cyst.  Left Kidney:  Renal measurements: 11.9 x 6.6 x 5.4 cm = volume: 222 mL. Normal echogenicity. No hydronephrosis or shadowing stone.  Bladder:  Appears normal  for degree of bladder distention.  Other:  None.  IMPRESSION: 1. Echogenic right kidney a 5 cm upper pole cyst. No hydronephrosis or shadowing stone. 2. Unremarkable left kidney and urinary bladder.   Electronically Signed By: Anner Crete M.D. On: 12/24/2020 23:22  No results found for this or any previous visit.  No results found for this or any previous visit.  No results found for this or any previous visit.   Assessment & Plan:    1. Elevated PSA -Prostate MRI -ExoDX    2. Erectile dysfunction, unspecified erectile dysfunction type -We will trial Muse 127m mcg  3. Benign prostatic hyperplasia with urinary obstruction -Continue uroxatral 10mg  qhs  4. Nocturia -Con tinue uroxatral 10mg  qhs   No follow-ups on file.  Nicolette Bang, MD  [facility] [place]

## 2021-03-06 NOTE — Patient Instructions (Signed)

## 2021-03-06 NOTE — Progress Notes (Signed)
Urological Symptom Review  Patient is experiencing the following symptoms: Weak stream Erection problems (male only)   Review of Systems  Gastrointestinal (upper)  : Negative for upper GI symptoms  Gastrointestinal (lower) : Negative for lower GI symptoms  Constitutional : Negative for symptoms  Skin: Negative for skin symptoms  Eyes: Negative for eye symptoms  Ear/Nose/Throat : Negative for Ear/Nose/Throat symptoms  Hematologic/Lymphatic: Negative for Hematologic/Lymphatic symptoms  Cardiovascular : Negative for cardiovascular symptoms  Respiratory : Shortness of breath  Endocrine: Negative for endocrine symptoms  Musculoskeletal: Negative for musculoskeletal symptoms  Neurological: Negative for neurological symptoms  Psychologic: Negative for psychiatric symptoms

## 2021-03-13 ENCOUNTER — Telehealth: Payer: Self-pay
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
## 2021-03-14 NOTE — Telephone Encounter (Signed)
Exosome DX order form completed via on line.

## 2021-03-18 ENCOUNTER — Other Ambulatory Visit: Payer: Self-pay | Admitting: Urology
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
## 2021-03-28 ENCOUNTER — Encounter: Payer: Self-pay | Admitting: Internal Medicine

## 2021-03-28 ENCOUNTER — Ambulatory Visit (HOSPITAL_COMMUNITY)
Admission: RE | Admit: 2021-03-28 | Discharge: 2021-03-28 | Disposition: A | Discharge status: Home / Self Care | Payer: PPO | Source: Ambulatory Visit | Attending: Internal Medicine | Admitting: Internal Medicine

## 2021-03-28 ENCOUNTER — Other Ambulatory Visit: Payer: Self-pay

## 2021-03-28 ENCOUNTER — Ambulatory Visit: Payer: PPO | Admitting: Internal Medicine

## 2021-03-28 DIAGNOSIS — I10 Essential (primary) hypertension: Secondary | ICD-10-CM | POA: Diagnosis not present

## 2021-03-28 DIAGNOSIS — R0609 Other forms of dyspnea: Secondary | ICD-10-CM | POA: Insufficient documentation

## 2021-03-28 MED ORDER — IRBESARTAN 150 MG PO TABS: 150.0000 mg | ORAL_TABLET | Freq: Every day | ORAL | 11 refills | Status: DC

## 2021-03-28 NOTE — Progress Notes (Signed)
Thomas Malone, male    DOB: 1950/02/07, 71 y.o.   MRN: [id number]   Brief patient profile:  70  yobm minimal pipe smoker with allergy rhinitis late spring never tested never needed anything but otc's and developed in 1980s dx sarcoid rx 3 m prednisone and better never completely back to baseline but neck/chest injury around 2018 downhill since referred to pulmonary clinic in [place]  03/28/2021 by Karie Kirks  for doe esp worse  since surgery for neck / back  05/2018 wt wt then      History of Present Illness  03/28/2021  Pulmonary/ 1st office eval/ [facility] / [facility]  Chief Complaint  Patient presents with   Consult    Ref by Dr. Karie Kirks for worsening SOB "for a few years"   Dyspnea:  does [employer] fine, across parking lot, main problem is inclines Walks to shop is 50 ft downhill and has trouble sob and stops on landing coming back to recover  Cough: x years no worse than usual /dry / daytime, sense of globus/ pnds Sleep: feels sleeps fine / 3 inch elevation HOB due GERD  SABA use: none   No obvious day to day or daytime variability or assoc excess/ purulent sputum or mucus plugs or hemoptysis or cp or chest tightness, subjective wheeze or overt sinus or hb symptoms.   Sleeping as above  without nocturnal  or early am exacerbation  of respiratory  c/o's or need for noct saba. Also denies any obvious fluctuation of symptoms with weather or environmental changes or other aggravating or alleviating factors except as outlined above   No unusual exposure hx or h/o childhood pna/ asthma or knowledge of premature birth.  Current Allergies, Complete Past Medical History, Past Surgical History, Family History, and Social History were reviewed in [employer] record.  ROS  The following are not active complaints unless bolded Hoarseness, sore throat, dysphagia, dental problems, itching, sneezing,  nasal congestion or discharge of excess mucus or purulent secretions,  ear ache,   fever, chills, sweats, unintended wt loss or wt gain, classically pleuritic or exertional cp,  orthopnea pnd or arm/hand swelling  or leg swelling, presyncope, palpitations, abdominal pain, anorexia, nausea, vomiting, diarrhea  or change in bowel habits or change in bladder habits, change in stools or change in urine, dysuria, hematuria,  rash, arthralgias, visual complaints, headache, numbness, weakness or ataxia or problems with walking or coordination,  change in mood or  memory.           Past Medical History:  Diagnosis Date   Abnormal CT of the chest 01/23/14   BPH (benign prostatic hyperplasia) 0/17/4944   Complication of anesthesia    hard to wake up    Diabetes mellitus without complication (HCC)    Elevated PSA    Erectile dysfunction    Gastroesophageal reflux    H/O sarcoidosis    Headache    7-8 months from gabapentin and naproxen   Hypertension    Pneumonia of upper lobe of lung 12/1213    Outpatient Medications Prior to Visit  Medication Sig Dispense Refill   alfuzosin (UROXATRAL) 10 MG 24 hr tablet Take 1 tablet (10 mg total) by mouth daily with breakfast. 90 tablet 3   alprostadil (MUSE) 1000 MCG pellet 1 each (1,000 mcg total) by Transurethral route as needed for erectile dysfunction. use no more than 3 times per week 6 each 12   AMBULATORY NON FORMULARY MEDICATION 0.3 mLs by Intracavernosal route  as needed. Medication Name: Trimix  PGE 30mg Pap [id number] 69m74m mL 5   aspirin EC 81 MG tablet Take 81 mg by mouth daily.     benazepril (LOTENSIN) 20 MG tablet Take 20 mg by mouth at bedtime.      docusate sodium (COLACE) 100 MG capsule      EPINEPHrine 0.3 mg/0.3 mL IJ SOAJ injection Inject into the muscle as directed.     famotidine (PEPCID) 20 MG tablet Take 1 tablet (20 mg total) by mouth 2 (two) times daily. 20 tablet 0   FREESTYLE LITE test strip SMARTSIG:Via Meter     Glucosamine-Chondroitin (COSAMIN DS PO) Take 1 tablet by mouth daily.      hydrochlorothiazide (HYDRODIURIL) 25 MG tablet Take 25 mg by mouth at bedtime.      Lancets (FREESTYLE) lancets 1 each 2 (two) times daily.     metFORMIN (GLUCOPHAGE) 500 MG tablet Take 500 mg by mouth 2 (two) times daily.     Multiple Vitamin (MULTIVITAMIN WITH MINERALS) TABS tablet Take 1 tablet by mouth daily.     omeprazole (PRILOSEC) 40 MG capsule Take 40 mg by mouth every other day.      augmented betamethasone dipropionate (DIPROLENE-AF) 0.05 % cream Apply topically.     cyclobenzaprine (FLEXERIL) 5 MG tablet Take 1 tablet (5 mg total) by mouth 3 (three) times daily as needed for muscle spasms. 30 tablet 0   ketoconazole (NIZORAL) 2 % cream Apply topically 2 (two) times daily as needed.     ketoconazole (NIZORAL) 2 % shampoo Apply topically 2 (two) times a week.     loteprednol (LOTEMAX) 0.5 % ophthalmic suspension      metFORMIN (GLUCOPHAGE-XR) 500 MG 24 hr tablet Take 500 mg by mouth 2 (two) times daily.   1   oxyCODONE-acetaminophen (PERCOCET/ROXICET) 5-325 MG tablet Take 1 tablet by mouth 4 (four) times daily as needed.     tamsulosin (FLOMAX) 0.4 MG CAPS capsule Take 0.4 mg by mouth daily.     No facility-administered medications prior to visit.     Objective:     BP 130/78 (BP Location: Left Arm, Patient Position: Sitting)    Pulse 70    Temp 98.2 F (36.8 C) (Temporal)    Ht 5' 9"  (1.753 m)    Wt 234 lb 3.2 oz (106.2 kg)    SpO2 98% Comment: ra   BMI 34.59 kg/m   SpO2: 98 % (ra)  Amb bm mild pseudowheeze   HEENT : pt wearing mask not removed for exam due to covid -19 concerns.    NECK :  without JVD/Nodes/TM/ nl carotid upstrokes bilaterally   LUNGS: no acc muscle use,  Nl contour chest which is clear to A and P bilaterally without cough on insp or exp maneuvers   CV:  RRR  no s3 or murmur or increase in P2, and no edema   ABD:  soft and nontender with nl inspiratory excursion in the supine position. No bruits or organomegaly appreciated, bowel sounds nl  MS:   Nl gait/ ext warm without deformities, calf tenderness, cyanosis or clubbing No obvious joint restrictions   SKIN: warm and dry without lesions    NEURO:  alert, approp, nl sensorium with  no motor or cerebellar deficits apparent.   CXR PA and Lateral:   03/28/2021 :    I personally reviewed images and impression is as follows:     Slt full hilar nodes/ no sign ILD  Lab Results  Component Value Date   NA 141 03/28/2021   K 4.4 03/28/2021   CO2 24 03/28/2021   GLUCOSE 133 (H) 03/28/2021   BUN 25 03/28/2021   CREATININE 1.91 (H) 03/28/2021   CALCIUM 9.8 03/28/2021   EGFR 37 (L) 03/28/2021   GFRNONAA 44 (L) 04/07/2019       Assessment   No problem-specific Assessment & Plan notes found for this encounter.     Christinia Gully, MD 03/28/2021

## 2021-03-28 NOTE — Patient Instructions (Addendum)
We will walk you fast around the office today for a baseline but keep walking daily so we can tell if the changes below are helping or not  Stop benazepril  and start Ibesartan 150 mg one daily  - can break in half if too strong (light headed standing)   Return in  10 days  for blood work Artist) and blood pressure check - call in meantime if having problems with the new medication  Please remember to go to the lab department   for your tests - we will contact you with the results when they are available.      Please remember to go to the  x-ray department  @  [facility] for your tests - we will contact you with the results when they are available      Please schedule a follow up office visit in 6 weeks, call sooner if needed

## 2021-03-29 LAB — BASIC METABOLIC PANEL
BUN/Creatinine Ratio: 13 (ref 10–24)
BUN: 25 mg/dL (ref 8–27)
CO2: 24 mmol/L (ref 20–29)
Calcium: 9.8 mg/dL (ref 8.6–10.2)
Chloride: 104 mmol/L (ref 96–106)
Creatinine, Ser: 1.91 mg/dL — ABNORMAL HIGH (ref 0.76–1.27)
Glucose: 133 mg/dL — ABNORMAL HIGH (ref 70–99)
Potassium: 4.4 mmol/L (ref 3.5–5.2)
Sodium: 141 mmol/L (ref 134–144)
eGFR: 37 mL/min/{1.73_m2} — ABNORMAL LOW (ref >59)

## 2021-03-29 NOTE — Assessment & Plan Note (Signed)
Onset 2018 p neck / chest injury esp since surgery 05/2018 at wt =240 - 03/28/2021   @ 234 Walked on RA  x  3  lap(s) =  approx 450  ft  @ mod pace, stopped due to end of study  with lowest 02 sats 93 % sob on 3rd lap   Symptoms are  disproportionate to objective findings and not clear to what extent this is actually a pulmonary  problem but pt does appear to have difficult to sort out respiratory symptoms of unknown origin for which  DDX  = almost all start with A and  include Adherence, Ace Inhibitors, Acid Reflux, Active Sinus Disease, Alpha 1 Antitripsin deficiency, Anxiety masquerading as Airways dz,  ABPA,  Allergy(esp in young), Aspiration (esp in elderly), Adverse effects of meds,  Active smoking or Vaping, A bunch of PE's/clot burden (a few small clots can't cause this syndrome unless there is already severe underlying pulm or vascular dz with poor reserve),  Anemia or thyroid disorder, plus two Bs  = Bronchiectasis and Beta blocker use..and one C= CHF   Adherence is always the initial "prime suspect" and is a multilayered concern that requires a "trust but verify" approach in every patient - starting with knowing how to use medications, especially inhalers, correctly, keeping up with refills and understanding the fundamental difference between maintenance and prns vs those medications only taken for a very short course and then stopped and not refilled.   ACEi adverse effects at the  top of the usual list of suspects and the only way to rule it out is a trial off > see a/p    ? Acid (or non-acid) GERD > always difficult to exclude as up to 75% of pts in some series report no assoc GI/ Heartburn symptoms> rec max (24h)  acid suppression and diet restrictions/ reviewed and instructions given in writing.   ? Anxiety/depression/ deconditioning  > usually at the bottom of this list of usual suspects but   may interfere with adherence and also interpretation of response or lack thereof to symptom  management which can be quite subjective.

## 2021-03-29 NOTE — Assessment & Plan Note (Signed)
Try off ACEi due to chronic cough, unexplained sob with pseudowheeze on exam 03/29/2021   In the best review of chronic cough to date ( NEJM 2016 375 [id number]) ,  ACEi are now felt to cause cough in up to  20% of pts which is a 4 fold increase from previous reports and does not include the variety of non-specific complaints we see in pulmonary clinic in pts on ACEi but previously attributed to another dx like  Copd/asthma and  include PNDS, throat and chest congestion, "bronchitis", unexplained dyspnea and noct "strangling" sensations, and hoarseness, but also  atypical /refractory GERD symptoms like dysphagia and "bad heartburn" (bolds apply to this pt)  The only way I know  to prove this is not an "ACEi Case" is a trial off ACEi x a minimum of 6 weeks then regroup.   Try avapro 150 mg one q day but f/u in 10 d with bp/ repeat bun/creat

## 2021-04-03 LAB — CBC WITH DIFFERENTIAL/PLATELET
Basophils Absolute: 0 10*3/uL (ref 0.0–0.2)
Basos: 1 %
EOS (ABSOLUTE): 0.3 10*3/uL (ref 0.0–0.4)
Eos: 5 %
Hematocrit: 37 % — ABNORMAL LOW (ref 37.5–51.0)
Hemoglobin: 12.4 g/dL — ABNORMAL LOW (ref 13.0–17.7)
Immature Grans (Abs): 0 10*3/uL (ref 0.0–0.1)
Immature Granulocytes: 0 %
Lymphocytes Absolute: 1.6 10*3/uL (ref 0.7–3.1)
Lymphs: 28 %
MCH: 27.2 pg (ref 26.6–33.0)
MCHC: 33.5 g/dL (ref 31.5–35.7)
MCV: 81 fL (ref 79–97)
Monocytes Absolute: 0.5 10*3/uL (ref 0.1–0.9)
Monocytes: 8 %
Neutrophils Absolute: 3.4 10*3/uL (ref 1.4–7.0)
Neutrophils: 58 %
Platelets: 274 10*3/uL (ref 150–450)
RBC: 4.56 x10E6/uL (ref 4.14–5.80)
RDW: 11.6 % (ref 11.6–15.4)
WBC: 5.9 10*3/uL (ref 3.4–10.8)

## 2021-04-03 LAB — HEPATIC FUNCTION PANEL
ALT: 12 IU/L (ref 0–44)
AST: 15 IU/L (ref 0–40)
Albumin: 4.3 g/dL (ref 3.8–4.8)
Alkaline Phosphatase: 87 IU/L (ref 44–121)
Bilirubin Total: 0.4 mg/dL (ref 0.0–1.2)
Bilirubin, Direct: 0.12 mg/dL (ref 0.00–0.40)
Total Protein: 7 g/dL (ref 6.0–8.5)

## 2021-04-03 LAB — BRAIN NATRIURETIC PEPTIDE: BNP: 14.8 pg/mL (ref 0.0–100.0)

## 2021-04-03 LAB — SEDIMENTATION RATE: Sed Rate: 31 mm/hr — ABNORMAL HIGH (ref 0–30)

## 2021-04-03 LAB — IGE: IgE (Immunoglobulin E), Serum: 240 IU/mL (ref 6–495)

## 2021-04-10 ENCOUNTER — Ambulatory Visit (HOSPITAL_COMMUNITY)
Admission: RE | Admit: 2021-04-10 | Discharge: 2021-04-10 | Disposition: A | Discharge status: Home / Self Care | Payer: PPO | Source: Ambulatory Visit | Attending: Urology | Admitting: Urology

## 2021-04-10 ENCOUNTER — Other Ambulatory Visit: Payer: Self-pay

## 2021-04-10 DIAGNOSIS — R972 Elevated prostate specific antigen [PSA]: Secondary | ICD-10-CM | POA: Insufficient documentation

## 2021-04-10 MED ORDER — GADOBUTROL 1 MMOL/ML IV SOLN
10.0000 mL | Freq: Once | INTRAVENOUS | Status: AC | PRN
  Administered 2021-04-10: 10 mL via INTRAVENOUS

## 2021-04-11 NOTE — Progress Notes (Signed)
Cardiology Office Note:    Date:  04/15/2021   ID:  Thomas Malone, DOB Jun 18, 1950, MRN [id number]  PCP:  Lemmie Evens, MD   [facility] Providers Cardiologist:  Freada Bergeron, MD {   Referring MD: Lemmie Evens, MD   CC: Dyspnea on Exertion  History of Present Illness:    Thomas Malone is a 71 y.o. male with a hx of HTN, CKD IIIa, and BPH who was referred by Dr. Karie Kirks, MD for further evaluation of dyspnea on exertion.  The patient states that he has been sedentary for about 4 years due to injury sustained at work. He now notes that he is having significant shortness of breath with activity. This is specifically worse with walking stairs or incline. He is able to make it a full flight of stairs without stopping, but just finds he is breathing a lot harder than he used to. He thinks this is overall getting worse over the past several months. No chest pain, palpitations, or diaphoresis. Has some lightheadedness when BP is elevated. Has LE edema with prolonged standing for which he wears compression socks. No prior ischemic work-up or TTE.  Blood pressure running 120-150/90s at home. BP meds recently adjusted.  Notably, the patient states he has a remote history of pulmonary sarcoid about 20 years ago. Was treated with steroids at that time. Has not had recurrent symptoms since. Recently saw Dr. Melvyn Novas with Pulm.   Past Medical History:  Diagnosis Date   Abnormal CT of the chest 01/23/14   BPH (benign prostatic hyperplasia) 05/12/8117   Complication of anesthesia    hard to wake up    Diabetes mellitus without complication (HCC)    Elevated PSA    Erectile dysfunction    Gastroesophageal reflux    H/O sarcoidosis    Headache    7-8 months from gabapentin and naproxen   Hypertension    Pneumonia of upper lobe of lung 12/1213    Past Surgical History:  Procedure Laterality Date   ANTERIOR CERVICAL DECOMP/DISCECTOMY FUSION N/A 05/28/2018   CATARACT EXTRACTION  W/PHACO Left 04/11/2019   Procedure: CATARACT EXTRACTION PHACO AND INTRAOCULAR LENS PLACEMENT (IOC) (CDE:5.80);  Surgeon: Baruch Goldmann, MD;  Location: [facility];  Service: Ophthalmology;  Laterality: Left;   CATARACT EXTRACTION W/PHACO Right 06/03/2019   Procedure: CATARACT EXTRACTION PHACO AND INTRAOCULAR LENS PLACEMENT RIGHT EYE;  Surgeon: Baruch Goldmann, MD;  Location: [facility];  Service: Ophthalmology;  Laterality: Right;  CDE: 9.82   COLONOSCOPY     COLONOSCOPY N/A 06/23/2014   Procedure: COLONOSCOPY;  Surgeon: Aviva Signs Md, MD;  Location: [facility] SU[facility];  Service: Gastroenterology;  Laterality: N/A;   EYE SURGERY Bilateral    lasic   ORTHOPEDIC SURGERY Left    heel surgery   PROSTATE BIOPSY     SHOULDER ARTHROSCOPY Right    repair of tendons and ligaments   TONSILLECTOMY      Current Medications: Current Meds  Medication Sig   alfuzosin (UROXATRAL) 10 MG 24 hr tablet Take 1 tablet (10 mg total) by mouth daily with breakfast.   aspirin EC 81 MG tablet Take 81 mg by mouth daily.   EPINEPHrine 0.3 mg/0.3 mL IJ SOAJ injection Inject into the muscle as directed.   FREESTYLE LITE test strip SMARTSIG:Via Meter   Glucosamine-Chondroitin (COSAMIN DS PO) Take 1 tablet by mouth daily.   hydrochlorothiazide (HYDRODIURIL) 25 MG tablet Take 25 mg by mouth at bedtime.    irbesartan (AVAPRO) 150 MG tablet  Take 1 tablet (150 mg total) by mouth daily.   Lancets (FREESTYLE) lancets 1 each 2 (two) times daily.   metFORMIN (GLUCOPHAGE) 500 MG tablet Take 500 mg by mouth 2 (two) times daily.   Multiple Vitamin (MULTIVITAMIN WITH MINERALS) TABS tablet Take 1 tablet by mouth daily.   omeprazole (PRILOSEC) 40 MG capsule Take 40 mg by mouth every other day.      Allergies:   Diclofenac, Diclofenac sodium, Levaquin [levofloxacin in d5w], and Lidocaine   Social History   Socioeconomic History   Marital status: Married    Spouse name: Not on file   Number of children: 2   Years of education: Not on file    Highest education level: Not on file  Occupational History   Occupation: concrete finisher  Tobacco Use   Smoking status: Former    Types: Cigars   Smokeless tobacco: Never   Tobacco comments:    and pipe-none since 2010  Vaping Use   Vaping Use: Never used  Substance and Sexual Activity   Alcohol use: No   Drug use: No   Sexual activity: Yes    Birth control/protection: None  Other Topics Concern   Not on file  Social History Narrative   Not on file   Social Determinants of Health   Financial Resource Strain: Not on file  Food Insecurity: Not on file  Transportation Needs: Not on file  Physical Activity: Not on file  Stress: Not on file  Social Connections: Not on file     Family History: The patient's family history includes Cancer in his mother; Diabetes in his brother and sister; Heart disease in his father and sister; Hyperlipidemia in his brother and sister.  ROS:   Please see the history of present illness.    Review of Systems  Constitutional:  Positive for malaise/fatigue.  Respiratory:  Positive for shortness of breath.   Cardiovascular:  Positive for leg swelling. Negative for chest pain, palpitations, orthopnea, claudication and PND.  Gastrointestinal:  Negative for nausea and vomiting.  Musculoskeletal:  Positive for joint pain and myalgias.  Neurological:  Positive for dizziness. Negative for loss of consciousness.  Psychiatric/Behavioral:  Negative for substance abuse.     EKGs/Labs/Other Studies Reviewed:    The following studies were reviewed today: CTA PE protocol 05/2018: FINDINGS: Cardiovascular: Suboptimal opacification of subsegmental branch vessels in the lower lobes. No acute central pulmonary embolus is seen. Nonaneurysmal aorta. Heart size within normal limits. No pericardial effusion   Mediastinum/Nodes: Midline trachea. No thyroid mass. Edema within the anterior neck soft tissues at the level of the thyroid with few scattered gas  bubbles. Esophagus slightly air distended. Mild mediastinal and hilar nodes.   Lungs/Pleura: No acute consolidation or effusion.  No pneumothorax.   Upper Abdomen: Punctate nonobstructing stone upper pole right kidney. 5 cm hypodense mass mid pole right kidney, likely a cyst.   Musculoskeletal: No acute or suspicious abnormality. Degenerative osteophytes of the spine.   Review of the MIP images confirms the above findings.   IMPRESSION: 1. Suboptimal opacification of subsegmental branch vessels in the lower lobes limits evaluation. No acute central pulmonary embolus is visualized. 2. Mild edema within the anterior neck soft tissues at the level of thyroid, which may be secondary to history of recent surgery 3. Clear lung fields 4. Scattered mediastinal and hilar lymph nodes, patient has history of sarcoidosis 5. Punctate nonobstructing stone upper pole right kidney    EKG:  EKG is  ordered today.  The ekg ordered today demonstrates NSR, RBBB, PVC  Recent Labs: 03/28/2021: ALT 12; BNP 14.8; BUN 25; Creatinine, Ser 1.91; Hemoglobin 12.4; Platelets 274; Potassium 4.4; Sodium 141  Recent Lipid Panel No results found for: CHOL, TRIG, HDL, CHOLHDL, VLDL, LDLCALC, LDLDIRECT   Risk Assessment/Calculations:           Physical Exam:    VS:  BP 130/82    Pulse 71    Ht '5\' 10"'$  (1.778 m)    Wt 236 lb (107 kg)    SpO2 96%    BMI 33.86 kg/m     Wt Readings from Last 3 Encounters:  04/15/21 236 lb (107 kg)  03/28/21 234 lb 3.2 oz (106.2 kg)  08/15/20 228 lb 6.4 oz (103.6 kg)     GEN:  Well nourished, well developed in no acute distress HEENT: Normal NECK: No JVD; No carotid bruits CARDIAC: RRR, no murmurs, rubs, gallops RESPIRATORY:  Clear to auscultation without rales, wheezing or rhonchi  ABDOMEN: Soft, non-tender, non-distended MUSCULOSKELETAL:  No edema; No deformity  SKIN: Warm and dry NEUROLOGIC:  Alert and oriented x 3 PSYCHIATRIC:  Normal affect   ASSESSMENT:     1. SOB (shortness of breath)   2. Primary hypertension   3. Pulmonary sarcoidosis (HCC)    PLAN:    In order of problems listed above:  #Dyspnea on Exertion: Patient with dyspnea on exertion that has been worsening over the past several months. Notably has been sedentary after an injury that occurred 4 years ago but was previously very active. No chest pain or associated lightheadedness, diaphoresis, nausea. Also with remote history of pulmonary sarcoid. Overall, unclear etiology of dyspnea. May be related to deconditioning, however, given risk factors and worsening symptoms, will check TTE and myoview. He is seeing Dr. Melvyn Novas from pulm as well. -Check myoview; no CTA due to CKD -Check TTE -Follow-up with Pulm as well  #HTN: Monitors at home. Running 120-150s. Recently medications were adjusted. -Continue irbesartan '150mg'$  daily -Continue HCTZ '25mg'$  daily -Keep BP log  #Remote history of pulmonary sarcoid: -Follow-up with Dr. Melvyn Novas as scheduled -Follow-up TTE  #CKD IIIB: -Plans to see nephrology -BP management as above  #CV risk stratification: -Lipids followed by primary -Lifestyle modifications discussed as below  Exercise recommendations: Goal of exercising for at least 30 minutes a day, at least 5 times per week.  Please exercise to a moderate exertion.  This means that while exercising it is difficult to speak in full sentences, however you are not so short of breath that you feel you must stop, and not so comfortable that you can carry on a full conversation.  Exertion level should be approximately a 5/10, if 10 is the most exertion you can perform.  Diet recommendations: Recommend a heart healthy diet such as the Mediterranean diet.  This diet consists of plant based foods, healthy fats, lean meats, olive oil.  It suggests limiting the intake of simple carbohydrates such as white breads, pastries, and pastas.  It also limits the amount of red meat, wine, and dairy products  such as cheese that one should consume on a daily basis.          Medication Adjustments/Labs and Tests Ordered: Current medicines are reviewed at length with the patient today.  Concerns regarding medicines are outlined above.  Orders Placed This Encounter  Procedures   NM Myocar Multi W/Spect W/Wall Motion / EF   ECHOCARDIOGRAM COMPLETE   No orders of the defined types were placed  in this encounter.   Patient Instructions  Medication Instructions:  Your physician recommends that you continue on your current medications as directed. Please refer to the Current Medication list given to you today.  *If you need a refill on your cardiac medications before your next appointment, please call your pharmacy*   Lab Work: NONE  If you have labs (blood work) drawn today and your tests are completely normal, you will receive your results only by: [place] (if you have MyChart) OR A paper copy in the mail If you have any lab test that is abnormal or we need to change your treatment, we will call you to review the results.   Testing/Procedures: Your physician has requested that you have an echocardiogram. Echocardiography is a painless test that uses sound waves to create images of your heart. It provides your doctor with information about the size and shape of your heart and how well your hearts chambers and valves are working. This procedure takes approximately one hour. There are no restrictions for this procedure.  Your physician has requested that you have en exercise stress myoview. For further information please visit [email or web address]. Please follow instruction sheet, as given.    Follow-Up: At [facility], you and your health needs are our priority.  As part of our continuing mission to provide you with exceptional heart care, we have created designated Provider Care Teams.  These Care Teams include your primary Cardiologist (physician) and Advanced Practice Providers  (APPs -  Physician Assistants and Nurse Practitioners) who all work together to provide you with the care you need, when you need it.  We recommend signing up for the patient portal called "MyChart".  Sign up information is provided on this After Visit Summary.  MyChart is used to connect with patients for Virtual Visits (Telemedicine).  Patients are able to view lab/test results, encounter notes, upcoming appointments, etc.  Non-urgent messages can be sent to your provider as well.   To learn more about what you can do with MyChart, go to [email or web address].    Your next appointment:   3 month(s)  The format for your next appointment:   In Person  Provider:   You may see Freada Bergeron, MD or one of the following Advanced Practice Providers on your designated Care Team:   [place], PA-C  Ermalinda Barrios, [place] .    Other Instructions Thank you for choosing [facility]!       Signed, Freada Bergeron, MD  04/15/2021 2:22 PM    [place]

## 2021-04-15 ENCOUNTER — Encounter: Payer: Self-pay | Admitting: *Deleted

## 2021-04-15 ENCOUNTER — Other Ambulatory Visit: Payer: Self-pay

## 2021-04-15 ENCOUNTER — Encounter: Payer: Self-pay | Admitting: Cardiology

## 2021-04-15 ENCOUNTER — Ambulatory Visit: Payer: PPO | Admitting: Cardiology

## 2021-04-15 VITALS — BP 130/82 | HR 71 | Ht 70.0 in | Wt 236.0 lb

## 2021-04-15 DIAGNOSIS — I10 Essential (primary) hypertension: Secondary | ICD-10-CM | POA: Diagnosis not present

## 2021-04-15 DIAGNOSIS — R0602 Shortness of breath: Secondary | ICD-10-CM

## 2021-04-15 DIAGNOSIS — D860 Sarcoidosis of lung: Secondary | ICD-10-CM | POA: Diagnosis not present

## 2021-04-15 NOTE — Patient Instructions (Signed)
Medication Instructions:  ?Your physician recommends that you continue on your current medications as directed. Please refer to the Current Medication list given to you today. ? ?*If you need a refill on your cardiac medications before your next appointment, please call your pharmacy* ? ? ?Lab Work: ?NONE ? ?If you have labs (blood work) drawn today and your tests are completely normal, you will receive your results only by: ?MyChart Message (if you have MyChart) OR ?A paper copy in the mail ?If you have any lab test that is abnormal or we need to change your treatment, we will call you to review the results. ? ? ?Testing/Procedures: ?Your physician has requested that you have an echocardiogram. Echocardiography is a painless test that uses sound waves to create images of your heart. It provides your doctor with information about the size and shape of your heart and how well your heart?s chambers and valves are working. This procedure takes approximately one hour. There are no restrictions for this procedure. ? ?Your physician has requested that you have en exercise stress myoview. For further information please visit [email or web address]. Please follow instruction sheet, as given.  ? ? ?Follow-Up: ?At [facility], you and your health needs are our priority.  As part of our continuing mission to provide you with exceptional heart care, we have created designated Provider Care Teams.  These Care Teams include your primary Cardiologist (physician) and Advanced Practice Providers (APPs -  Physician Assistants and Nurse Practitioners) who all work together to provide you with the care you need, when you need it. ? ?We recommend signing up for the patient portal called "MyChart".  Sign up information is provided on this After Visit Summary.  MyChart is used to connect with patients for Virtual Visits (Telemedicine).  Patients are able to view lab/test results, encounter notes, upcoming appointments, etc.  Non-urgent  messages can be sent to your provider as well.   ?To learn more about what you can do with MyChart, go to [email or web address].   ? ?Your next appointment:   ?3 month(s) ? ?The format for your next appointment:   ?In Person ? ?Provider:   ?You may see Freada Bergeron, MD or one of the following Advanced Practice Providers on your designated Care Team:   ?Bernerd Pho, PA-C  ?Ermalinda Barrios, PA-C .  ? ? ?Other Instructions ?Thank you for choosing [facility]! ?  ? ? ?

## 2021-04-16 NOTE — Addendum Note (Signed)
Addended by: Christella Scheuermann C on: 04/16/2021 03:56 PM ? ? Modules accepted: Orders ? ?

## 2021-04-17 ENCOUNTER — Ambulatory Visit (INDEPENDENT_AMBULATORY_CARE_PROVIDER_SITE_OTHER): Payer: PPO | Admitting: Urology

## 2021-04-17 ENCOUNTER — Other Ambulatory Visit: Payer: Self-pay

## 2021-04-17 ENCOUNTER — Ambulatory Visit (HOSPITAL_COMMUNITY)
Admission: RE | Admit: 2021-04-17 | Discharge: 2021-04-17 | Disposition: A | Discharge status: Home / Self Care | Payer: PPO | Source: Ambulatory Visit | Attending: Cardiology | Admitting: Cardiology

## 2021-04-17 ENCOUNTER — Encounter (HOSPITAL_COMMUNITY): Payer: Self-pay

## 2021-04-17 VITALS — BP 130/89 | HR 69

## 2021-04-17 DIAGNOSIS — R0602 Shortness of breath: Secondary | ICD-10-CM | POA: Diagnosis present

## 2021-04-17 DIAGNOSIS — R972 Elevated prostate specific antigen [PSA]: Secondary | ICD-10-CM

## 2021-04-17 MED ORDER — TECHNETIUM TC 99M TETROFOSMIN IV KIT
30.0000 | PACK | Freq: Once | INTRAVENOUS | Status: AC | PRN
  Administered 2021-04-17: 32.8 via INTRAVENOUS

## 2021-04-17 MED ORDER — TECHNETIUM TC 99M TETROFOSMIN IV KIT
10.0000 | PACK | Freq: Once | INTRAVENOUS | Status: AC | PRN
  Administered 2021-04-17: 11 via INTRAVENOUS

## 2021-04-17 MED ORDER — FINASTERIDE 5 MG PO TABS: 5.0000 mg | ORAL_TABLET | Freq: Every day | ORAL | 3 refills | Status: DC

## 2021-04-17 MED ORDER — REGADENOSON 0.4 MG/5ML IV SOLN
INTRAVENOUS | Status: AC
  Filled 2021-04-17: qty 5

## 2021-04-17 MED ORDER — SODIUM CHLORIDE FLUSH 0.9 % IV SOLN
INTRAVENOUS | Status: AC
  Administered 2021-04-17: 10 mL via INTRAVENOUS
  Filled 2021-04-17: qty 10

## 2021-04-17 NOTE — Progress Notes (Signed)
? ?04/17/2021 ?4:35 PM  ? ?Denese Killings Valade ?04-06-1950 ?[id number] ? ?Referring provider: Lemmie Evens, MD ?[address] ?[place],  [place] [address] ? ?Followup elevated PSA ? ? ?HPI: ?Mr Kleinfelter is a 71yo here for followup for elevated PSA. He underwent prostate MRI which showed 3 3-68m PIRADs 4 lesions. ExoDX was 32. No other complaints today ? ? ?PMH: ?Past Medical History:  ?Diagnosis Date  ? Abnormal CT of the chest 01/23/14  ? BPH (benign prostatic hyperplasia) 05/31/2018  ? Complication of anesthesia   ? hard to wake up   ? Diabetes [place] complication [place]   ? Elevated PSA   ? Erectile dysfunction   ? Gastroesophageal reflux   ? H/O sarcoidosis   ? Headache   ? 7-8 months from gabapentin and naproxen  ? Hypertension   ? Pneumonia of upper lobe of lung 12/1213  ? ? ?Surgical History: ?Past Surgical History:  ?Procedure Laterality Date  ? ANTERIOR CERVICAL DECOMP/DISCECTOMY FUSION N/A 05/28/2018  ? CATARACT EXTRACTION W/PHACO Left 04/11/2019  ? Procedure: CATARACT EXTRACTION PHACO AND INTRAOCULAR LENS PLACEMENT (IOC) (CDE:5.80);  Surgeon: WBaruch Goldmann MD;  Location: [facility];  Service: Ophthalmology;  Laterality: Left;  ? CATARACT EXTRACTION W/PHACO Right 06/03/2019  ? Procedure: CATARACT EXTRACTION PHACO AND INTRAOCULAR LENS PLACEMENT RIGHT EYE;  Surgeon: WBaruch Goldmann MD;  Location: [facility];  Service: Ophthalmology;  Laterality: Right;  CDE: 9.82  ? COLONOSCOPY    ? COLONOSCOPY N/A 06/23/2014  ? Procedure: COLONOSCOPY;  Surgeon: MAviva SignsMd, MD;  Location: [facility] SUITE;  Service: Gastroenterology;  Laterality: N/A;  ? EYE SURGERY Bilateral   ? lasic  ? ORTHOPEDIC SURGERY Left   ? heel surgery  ? PROSTATE BIOPSY    ? SHOULDER ARTHROSCOPY Right   ? repair of tendons and ligaments  ? TONSILLECTOMY    ? ? ?Home Medications:  ?Allergies as of 04/17/2021   ? ?   Reactions  ? Diclofenac Anaphylaxis  ? Diclofenac Sodium Anaphylaxis  ? Topical gel  ? Levaquin [levofloxacin In D5w] Anaphylaxis  ? Lidocaine  Anaphylaxis  ? ?  ? ?  ?Medication List  ?  ? ?  ? Accurate as of April 17, 2021  4:35 PM. If you have any questions, ask your nurse or doctor.  ?  ?  ? ?  ? ?alfuzosin 10 MG 24 hr tablet ?Commonly known as: UROXATRAL ?Take 1 tablet (10 mg total) by mouth daily with breakfast. ?  ?alprostadil 1000 MCG pellet ?Commonly known as: Muse ?1 each (1,000 mcg total) by Transurethral route as needed for erectile dysfunction. use no more than 3 times per week ?  ?AMBULATORY NON FORMULARY MEDICATION ?0.3 mLs by Intracavernosal route as needed. Medication Name: Trimix ? ?PGE 362m ?Pap '30mg'$  ?Phent '1mg'$  ?  ?aspirin EC 81 MG tablet ?Take 81 mg by mouth daily. ?  ?benazepril 20 MG tablet ?Commonly known as: LOTENSIN ?Take 20 mg by mouth at bedtime. ?  ?COSAMIN DS PO ?Take 1 tablet by mouth daily. ?  ?docusate sodium 100 MG capsule ?Commonly known as: COLACE ?  ?EPINEPHrine 0.3 mg/0.3 mL Soaj injection ?Commonly known as: EPI-PEN ?Inject into the muscle as directed. ?  ?famotidine 20 MG tablet ?Commonly known as: PEPCID ?Take 1 tablet (20 mg total) by mouth 2 (two) times daily. ?  ?finasteride 5 MG tablet ?Commonly known as: PROSCAR ?Take 1 tablet (5 mg total) by mouth daily. ?Started by: PaNicolette BangMD ?  ?freestyle lancets ?1 each 2 (  two) times daily. ?  ?FREESTYLE LITE test strip ?Generic drug: glucose blood ?SMARTSIG:Via Meter ?  ?hydrochlorothiazide 25 MG tablet ?Commonly known as: HYDRODIURIL ?Take 25 mg by mouth at bedtime. ?  ?irbesartan 150 MG tablet ?Commonly known as: Avapro ?Take 1 tablet (150 mg total) by mouth daily. ?  ?metFORMIN 500 MG tablet ?Commonly known as: GLUCOPHAGE ?Take 500 mg by mouth 2 (two) times daily. ?  ?multivitamin with minerals Tabs tablet ?Take 1 tablet by mouth daily. ?  ?omeprazole 40 MG capsule ?Commonly known as: PRILOSEC ?Take 40 mg by mouth every other day. ?  ? ?  ? ? ?Allergies:  ?Allergies  ?Allergen Reactions  ? Diclofenac Anaphylaxis  ? Diclofenac Sodium Anaphylaxis  ?  Topical gel   ? Levaquin [Levofloxacin In D5w] Anaphylaxis  ? Lidocaine Anaphylaxis  ? ? ?Family History: ?Family History  ?Problem Relation Age of Onset  ? Cancer Mother   ? Heart disease Father   ? Hyperlipidemia Sister   ? Diabetes Sister   ? Heart disease Sister   ? Hyperlipidemia Brother   ? Diabetes Brother   ? ? ?Social History:  reports that he has quit smoking. His smoking use included cigars. He has never used smokeless tobacco. He reports that he does not drink alcohol and does not use drugs. ? ?ROS: ?All other review of systems were reviewed and are negative except what is noted above in HPI ? ?Physical Exam: ?BP 130/89   Pulse 69   ?Constitutional:  Alert and oriented, No acute distress. ?HEENT: [place] AT, moist mucus membranes.  Trachea midline, no masses. ?Cardiovascular: No clubbing, cyanosis, or edema. ?Respiratory: Normal respiratory effort, no increased work of breathing. ?GI: Abdomen is soft, nontender, nondistended, no abdominal masses ?GU: No CVA tenderness.  ?Lymph: No cervical or inguinal lymphadenopathy. ?Skin: No rashes, bruises or suspicious lesions. ?Neurologic: Grossly intact, no focal deficits, moving all 4 extremities. ?Psychiatric: Normal mood and affect. ? ?Laboratory Data: ?Lab Results  ?Component Value Date  ? WBC 5.9 03/28/2021  ? HGB 12.4 (L) 03/28/2021  ? HCT 37.0 (L) 03/28/2021  ? MCV 81 03/28/2021  ? PLT 274 03/28/2021  ? ? ?Lab Results  ?Component Value Date  ? CREATININE 1.91 (H) 03/28/2021  ? ? ?Lab Results  ?Component Value Date  ? PSA 6.7 (H) 06/22/2019  ? ? ?No results found for: TESTOSTERONE ? ?Lab Results  ?Component Value Date  ? HGBA1C 6.8 (H) 04/07/2019  ? ? ?Urinalysis ?   ?Component Value Date/Time  ? COLORURINE STRAW (A) 11/15/2016 0617  ? APPEARANCEUR Clear 08/15/2020 1133  ? LABSPEC 1.009 11/15/2016 0617  ? PHURINE 7.0 11/15/2016 0617  ? GLUCOSEU Negative 08/15/2020 1133  ? [place] NEGATIVE 11/15/2016 0617  ? BILIRUBINUR Negative 08/15/2020 1133  ? [place] NEGATIVE 11/15/2016  0617  ? PROTEINUR Negative 08/15/2020 1133  ? [place] NEGATIVE 11/15/2016 0617  ? UROBILINOGEN 0.2 06/22/2019 1104  ? NITRITE Negative 08/15/2020 1133  ? NITRITE NEGATIVE 11/15/2016 0617  ? LEUKOCYTESUR Negative 08/15/2020 1133  ? ? ?Lab Results  ?Component Value Date  ? LABMICR Comment 08/15/2020  ? ? ?Pertinent Imaging: ?MRI 04/10/2021: Images reviewed and discussed with the patient ?No results found for this or any previous visit. ? ?No results found for this or any previous visit. ? ?No results found for this or any previous visit. ? ?No results found for this or any previous visit. ? ?Results for orders placed during the hospital encounter of 12/24/20 ? ?US RENAL ? ?Narrative ?CLINICAL DATA:  Chronic kidney disease. ? ?EXAM: ?RENAL / URINARY TRACT ULTRASOUND COMPLETE ? ?COMPARISON:  None. ? ?FINDINGS: ?Right Kidney: ? ?Renal measurements: 11.5 x 6.1 x 5.2 cm = volume: 188 mL. There is ?diffuse increased renal parenchymal echogenicity. No hydronephrosis ?or shadowing stone. There is a 5 cm upper pole cyst. ? ?Left Kidney: ? ?Renal measurements: 11.9 x 6.6 x 5.4 cm = volume: 222 mL. Normal ?echogenicity. No hydronephrosis or shadowing stone. ? ?Bladder: ? ?Appears normal for degree of bladder distention. ? ?Other: ? ?None. ? ?IMPRESSION: ?1. Echogenic right kidney a 5 cm upper pole cyst. No hydronephrosis ?or shadowing stone. ?2. Unremarkable left kidney and urinary bladder. ? ? ?Electronically Signed ?By: Anner Crete M.D. ?On: 12/24/2020 23:22 ? ?No results found for this or any previous visit. ? ?No results found for this or any previous visit. ? ?No results found for this or any previous visit. ? ? ?Assessment & Plan:   ? ?1. Elevated PSA ?-start finasteride '5mg'$  ?- Urinalysis, Routine w reflex microscopic ?- PSA; Future ? ? ?No follow-ups on file. ? ?Nicolette Bang, MD ? ?[facility] [place] ?  ?

## 2021-04-17 NOTE — Patient Instructions (Signed)
Prostate-Specific Antigen Test ?Why am I having this test? ?The prostate-specific antigen (PSA) test is a screening test for prostate cancer. It can identify early signs of prostate cancer, which may allow for early detection and more effective treatment. Your health care provider may recommend that you have a PSA test starting at age 70 or that you have one earlier if you are at higher risk for prostate cancer. You may also have a PSA test: ?To monitor treatment of prostate cancer. ?To check whether prostate cancer has returned after treatment. ?What is being tested? ?This test measures the amount of PSA in your blood. PSA is a protein that is made in the prostate. The prostate naturally produces more PSA as you age, but very high levels may be a sign of a medical condition. ?What kind of sample is taken? ?A blood sample is required for this test. It is usually collected by inserting a needle into a blood vessel but can also be collected by sticking a finger with a small needle. Blood for this test should be drawn before having an exam of the prostate that involves digital rectal examination to avoid affecting the results. ?How do I prepare for this test? ?Do not ejaculate starting 24 hours before your test, or as long as told by your health care provider, as this can cause an elevation in PSA. ?Do not undergo any procedures that require manipulation of the prostate, such as biopsy or surgery, for 6 weeks before the test is done as this can cause an elevation in PSA. ?Tell a health care provider about: ?Any signs you may have of other conditions that can affect PSA levels, such as: ?An enlarged prostate that is not caused by cancer (benign prostatic hyperplasia, or BPH). This condition is very common in older men. ?A prostate or urinary tract infection. ?Any allergies you have. ?All medicines you are taking, including vitamins, herbs, eye drops, creams, and over-the-counter medicines. This also includes: ?Medicines  to assist with hair growth, such as finasteride. ?Any recent exposure to a medicine called diethylstilbestrol (DES). ?Medicines such as male hormones (like testosterone) or other medicines that raise testosterone levels. ?Any bleeding problems you have. ?Any recent procedures you have had, especially any procedures involving the prostate or rectum. ?Any medical conditions you have. ?How are the results reported? ?Your test results will be reported as a value that indicates how much PSA is in your blood. This will be given as nanograms of PSA per milliliter of blood (ng/mL). Your health care provider will compare your results to normal ranges that were established after testing a large group of people (reference ranges). Reference ranges may vary among labs and hospitals. ?PSA levels vary from person to person and generally increase with age. Because of this variation, there is no single PSA value that is considered normal for everyone. Instead, PSA reference ranges are used to describe whether your PSA levels are considered low or high (elevated). Common reference ranges are: ?Low: 0-2.5 ng/mL. ?Slightly to moderately elevated: 2.6-10.0 ng/mL. ?Moderately elevated: 10.0-19.9 ng/mL. ?Significantly elevated: 20 ng/mL or greater. ?What do the results mean? ?A test result that is higher than 4 ng/mL may mean that you have prostate cancer. However, a PSA test by itself is not enough to diagnose prostate cancer. High PSA levels may also be caused by the natural aging process, prostate infection (prostatitis), or BPH. ?PSA screening cannot tell you if your PSA is high due to cancer or a different cause. ?A prostate biopsy  is the only way to diagnose prostate cancer. ?A risk of having the PSA test is diagnosing and treating prostate cancer that would never have caused any symptoms or problems (overdiagnosis and overtreatment). ?Talk with your health care provider about what your results mean. In some cases, your health care  provider may do more testing to confirm the results. ?Questions to ask your health care provider ?Ask your health care provider, or the department that is doing the test: ?When will my results be ready? ?How will I get my results? ?What are my treatment options? ?What other tests do I need? ?What are my next steps? ?Summary ?The prostate-specific antigen (PSA) test is a screening test for prostate cancer. ?Your health care provider may recommend that you have a PSA test starting at age 59 or that you have one earlier if you are at higher risk for prostate cancer. ?A test result that is higher than 4 ng/mL may mean that you have prostate cancer. However, elevated levels can be caused by a number of conditions other than prostate cancer. ?Talk with your health care provider about what your results mean. ?This information is not intended to replace advice given to you by your health care provider. Make sure you discuss any questions you have with your health care provider. ?Document Revised: 05/30/2020 Document Reviewed: 05/30/2020 ?Elsevier Patient Education ? [place]. ? ?

## 2021-04-18 LAB — NM MYOCAR MULTI W/SPECT W/WALL MOTION / EF
Angina Index: 0
Duke Treadmill Score: 6
Estimated workload: 7
Exercise duration (min): 5 min
Exercise duration (sec): 54 s
LV dias vol: 100 mL (ref 62–150)
LV sys vol: 44 mL
MPHR: 151 {beats}/min
Nuc Stress EF: 56 %
Peak HR: 136 {beats}/min
Percent HR: 90 %
RATE: 0.4
RPE: 12
Rest HR: 64 {beats}/min
Rest Nuclear Isotope Dose: 11 mCi
SDS: 1
SRS: 4
SSS: 5
ST Depression (mm): 0 mm
Stress Nuclear Isotope Dose: 32.8 mCi
TID: 1.05

## 2021-04-23 ENCOUNTER — Encounter: Payer: Self-pay | Admitting: Urology

## 2021-05-03 ENCOUNTER — Encounter (HOSPITAL_BASED_OUTPATIENT_CLINIC_OR_DEPARTMENT_OTHER): Payer: Self-pay

## 2021-05-03 DIAGNOSIS — R5383 Other fatigue: Secondary | ICD-10-CM

## 2021-05-03 DIAGNOSIS — G4733 Obstructive sleep apnea (adult) (pediatric): Secondary | ICD-10-CM

## 2021-05-03 DIAGNOSIS — R0683 Snoring: Secondary | ICD-10-CM

## 2021-05-07 ENCOUNTER — Ambulatory Visit (HOSPITAL_COMMUNITY)
Admission: RE | Admit: 2021-05-07 | Discharge: 2021-05-07 | Disposition: A | Discharge status: Home / Self Care | Payer: PPO | Source: Ambulatory Visit | Attending: Cardiology | Admitting: Cardiology

## 2021-05-07 DIAGNOSIS — R0602 Shortness of breath: Secondary | ICD-10-CM | POA: Diagnosis not present

## 2021-05-07 LAB — ECHOCARDIOGRAM COMPLETE
AR max vel: 2.79 cm2
AV Area VTI: 2.76 cm2
AV Area mean vel: 2.61 cm2
AV Mean grad: 2.7 mmHg
AV Peak grad: 4.8 mmHg
Ao pk vel: 1.1 m/s
Area-P 1/2: 1.86 cm2
S' Lateral: 2.8 cm

## 2021-05-07 NOTE — Progress Notes (Signed)
*  PRELIMINARY RESULTS* ?Echocardiogram ?2D Echocardiogram has been performed. ? ?Thomas Malone ?05/07/2021, 9:56 AM ?

## 2021-05-09 ENCOUNTER — Encounter: Payer: Self-pay | Admitting: Internal Medicine

## 2021-05-09 ENCOUNTER — Ambulatory Visit: Payer: PPO | Admitting: Internal Medicine

## 2021-05-09 DIAGNOSIS — R0609 Other forms of dyspnea: Secondary | ICD-10-CM | POA: Diagnosis not present

## 2021-05-09 DIAGNOSIS — J309 Allergic rhinitis, unspecified: Secondary | ICD-10-CM | POA: Diagnosis not present

## 2021-05-09 DIAGNOSIS — I10 Essential (primary) hypertension: Secondary | ICD-10-CM

## 2021-05-09 NOTE — Assessment & Plan Note (Signed)
Onset 2018 p neck / chest injury esp since surgery 05/2018 at wt =240 ?- 03/28/2021   @ 234 Walked on RA  x  3  lap(s) =  approx 450  ft  @ mod pace, stopped due to end of study  with lowest 02 sats 93 % sob on 3rd lap  ?- Labs ordered  03/28/21   :  Allergy screen  Eos 0.3/ IgE 240   ?- 05/09/2021 improved to pt satisfaction off ACEi  > pulmonary f/u prn  ? ?Clearly improved though needs close f/u of renal function / bp/ advised  ?

## 2021-05-09 NOTE — Progress Notes (Signed)
? ?TITUS DRONE, male    DOB: 1950/08/09,   MRN: [id number] ? ? ?Brief patient profile:  ?70  yobm minimal pipe smoker with allergy rhinitis late spring never tested never needed anything but otc's and  1980s  cough/sob dx sarcoid rx 3 m prednisone and better never completely back to baseline but neck/chest injury around 2018 downhill since referred to pulmonary clinic in [facility]  03/28/2021 by Karie Kirks  for doe esp worse since surgery for neck / back  05/2018  wt then = around  ?  (Not recorded in epic/care everywhere)  ? ? ? ? ?History of Present Illness  ?03/28/2021  Pulmonary/ 1st office eval/ Melvyn Novas / [facility]  ?Chief Complaint  ?Patient presents with  ? Consult  ?  Ref by Dr. Karie Kirks for worsening SOB "for a few years"   ?Dyspnea:  does [employer] fine, across parking lot, main problem is inclines ?Walks to shop is 50 ft downhill and has\ sob and stops on landing coming back to recover  ?Cough: x years no worse than usual /dry / daytime, sense of globus/ pnds ?Sleep: feels sleeps fine / 3 inch elevation HOB due GERD  ?SABA use: none   ?Rec ?Stop benazepril  and start Ibesartan 150 mg one daily  - can break in half if too strong (light headed standing)  ? ? ?05/09/2021  f/u ov/[place] office/Tattiana Fakhouri re: ? Acei case ?  maint on Ibesartan 150 mg daily   ?Chief Complaint  ?Patient presents with  ? Follow-up  ?  Patient is doing good no concerns  ?Dyspnea:  nl  pace x one half mile 4/5 ?Cough: none  ?Sleeping: bed blocks/ sleeping fine  ?SABA use: none ?02: none  ?Covid status: all of em ?  ? ? ?No obvious day to day or daytime variability or assoc excess/ purulent sputum or mucus plugs or hemoptysis or cp or chest tightness, subjective wheeze or overt sinus or hb symptoms.  ? ?Sleeping  without nocturnal  or early am exacerbation  of respiratory  c/o's or need for noct saba. Also denies any obvious fluctuation of symptoms with weather or environmental changes or other aggravating or alleviating factors except  as outlined above  ? ?No unusual exposure hx or h/o childhood pna/ asthma or knowledge of premature birth. ? ?Current Allergies, Complete Past Medical History, Past Surgical History, Family History, and Social History were reviewed in [employer] record. ? ?ROS  The following are not active complaints unless bolded ?Hoarseness, sore throat, dysphagia, dental problems, itching, sneezing,  nasal congestion or discharge of excess mucus or purulent secretions, ear ache,   fever, chills, sweats, unintended wt loss or wt gain, classically pleuritic or exertional cp,  orthopnea pnd or arm/hand swelling  or leg swelling, presyncope, palpitations, abdominal pain, anorexia, nausea, vomiting, diarrhea  or change in bowel habits or change in bladder habits, change in stools or change in urine, dysuria, hematuria,  rash, arthralgias, visual complaints, headache, numbness, weakness or ataxia or problems with walking or coordination,  change in mood or  memory. ?      ? ?Current Meds  ?Medication Sig  ? alfuzosin (UROXATRAL) 10 MG 24 hr tablet Take 1 tablet (10 mg total) by mouth daily with breakfast.  ? aspirin EC 81 MG tablet Take 81 mg by mouth daily.  ? EPINEPHrine 0.3 mg/0.3 mL IJ SOAJ injection Inject into the muscle as directed.  ? FREESTYLE LITE test strip SMARTSIG:Via Meter  ? Glucosamine-Chondroitin (COSAMIN  DS PO) Take 1 tablet by mouth daily.  ? hydrochlorothiazide (HYDRODIURIL) 25 MG tablet Take 25 mg by mouth at bedtime.   ? irbesartan (AVAPRO) 150 MG tablet Take 1 tablet (150 mg total) by mouth daily.  ? Lancets (FREESTYLE) lancets 1 each 2 (two) times daily.  ? metFORMIN (GLUCOPHAGE) 500 MG tablet Take 500 mg by mouth 2 (two) times daily.  ? Multiple Vitamin (MULTIVITAMIN WITH MINERALS) TABS tablet Take 1 tablet by mouth daily.  ? omeprazole (PRILOSEC) 40 MG capsule Take 40 mg by mouth every other day.   ?     ? ?Past Medical History:  ?Diagnosis Date  ? Abnormal CT of the chest 01/23/14  ?  BPH (benign prostatic hyperplasia) 05/31/2018  ? Complication of anesthesia   ? hard to wake up   ? Diabetes mellitus without complication ([place])   ? Elevated PSA   ? Erectile dysfunction   ? Gastroesophageal reflux   ? H/O sarcoidosis   ? Headache   ? 7-8 months from gabapentin and naproxen  ? Hypertension   ? Pneumonia of upper lobe of lung 12/1213  ? ?  ? ? ?Objective:  ?  ? ?Wt Readings from Last 3 Encounters:  ?05/09/21 237 lb 6.4 oz (107.7 kg)  ?04/15/21 236 lb (107 kg)  ?03/28/21 234 lb 3.2 oz (106.2 kg)  ?  ? ? ?Vital signs reviewed  05/09/2021  - Note at rest 02 sats  100% on RA  ? ?General appearance:    amb bm   nad  ? ? HEENT : nl exam/ no longer any pseudowheeze  ? ? ?NECK :  without JVD/Nodes/TM/ nl carotid upstrokes bilaterally ? ? ?LUNGS: no acc muscle use,  Nl contour chest which is clear to A and P bilaterally without cough on insp or exp maneuvers ? ? ?CV:  RRR  no s3 or murmur or increase in P2, and no edema  ? ?ABD:  soft and nontender with nl inspiratory excursion in the supine position. No bruits or organomegaly appreciated, bowel sounds nl ? ?MS:  Nl gait/ ext warm without deformities, calf tenderness, cyanosis or clubbing ?No obvious joint restrictions  ? ?SKIN: warm and dry without lesions   ? ?NEURO:  alert, approp, nl sensorium with  no motor or cerebellar deficits apparent.  ?  ? ? ? ? ?I personally reviewed images and agree with radiology impression as follows:  ?CXR:   pa and lat  03/28/21 ?Relatively similar appearance of the lungs with background chronic ?changes and no definite evidence of acute cardiopulmonary disease. ?Assessment  ? ?  ?   ?  ?

## 2021-05-09 NOTE — Assessment & Plan Note (Signed)
Try off ACEi due to chronic cough, unexplained sob with pseudowheeze on exam 03/29/2021  > resolved as of 05/09/2021 as did globus/pnds sensation  ? ?Although even in retrospect it may not be clear the ACEi contributed to the pt's symptoms,  Pt improved off them and adding them back at this point or in the future would risk confusion in interpretation of non-specific respiratory symptoms to which this patient is prone  ie  Better not to muddy the waters here.  ? ?>>> avapro 150 mg daily and f/u renal and pcp but avoid ACEi ?

## 2021-05-09 NOTE — Patient Instructions (Addendum)
For allergies > clariton or allegra over the counter as needed  ? ?Stay as active as you can  ? ?Make sure you check your oxygen saturation at your highest level of activity to be sure it stays over 90% and keep track of it at least once a week, more often if breathing getting worse, and let me know if losing ground.  ? ? If you are satisfied with your treatment plan,  let your doctor know and he/she can either refill your medications or you can return here when your prescription runs out.   ? ? If in any way you are not 100% satisfied,  please tell us.  If 100% better, tell your friends! ? ?Pulmonary follow up is as needed   ?

## 2021-05-09 NOTE — Assessment & Plan Note (Signed)
Labs ordered  03/28/21   :  Allergy screen  Eos 0.3/ IgE 240   ? ?Offered referral to allergy/ declined so rec non -sedating H1 prn and f/u allergy if not effective at his discretion ? ?    ?  ? ?Each maintenance medication was reviewed in detail including emphasizing most importantly the difference between maintenance and prns and under what circumstances the prns are to be triggered using an action plan format where appropriate. ? ?Total time for H and P, chart review, counseling,  and generating customized AVS unique to this summary fianl office visit / same day charting = 34 min ?     ?

## 2021-06-17 ENCOUNTER — Ambulatory Visit: Payer: PPO | Attending: Neurology | Admitting: Neurology

## 2021-06-17 DIAGNOSIS — R0683 Snoring: Secondary | ICD-10-CM | POA: Diagnosis present

## 2021-06-17 DIAGNOSIS — G4733 Obstructive sleep apnea (adult) (pediatric): Secondary | ICD-10-CM

## 2021-06-17 DIAGNOSIS — R400 Somnolence: Secondary | ICD-10-CM | POA: Insufficient documentation

## 2021-06-17 DIAGNOSIS — R5383 Other fatigue: Secondary | ICD-10-CM

## 2021-06-24 NOTE — Procedures (Signed)
Leola A. Merlene Laughter, MD     [email or web address]             NOCTURNAL POLYSOMNOGRAPHY   LOCATION: [facility]  Patient Name: Thomas, Malone Date: 06/17/2021 Gender: Male D.O.B: 1950-05-24 Age (years): 44 Referring Provider: Phillips Odor MD, ABSM Height (inches): 70 Interpreting Physician: Phillips Odor MD, ABSM Weight (lbs): 234 RPSGT: Rosebud Poles BMI: 34 MRN: [id number] Neck Size: 19.50 CLINICAL INFORMATION Sleep Study Type: NPSG     Indication for sleep study: N/A     Epworth Sleepiness Score: 7     SLEEP STUDY TECHNIQUE As per the AASM Manual for the Scoring of Sleep and Associated Events v2.3 (April 2016) with a hypopnea requiring 4% desaturations.  The channels recorded and monitored were frontal, central and occipital EEG, electrooculogram (EOG), submentalis EMG (chin), nasal and oral airflow, thoracic and abdominal wall motion, anterior tibialis EMG, snore microphone, electrocardiogram, and pulse oximetry.  MEDICATIONS Medications self-administered by patient taken the night of the study : N/A  Current Outpatient Medications:    alfuzosin (UROXATRAL) 10 MG 24 hr tablet, Take 1 tablet (10 mg total) by mouth daily with breakfast., Disp: 90 tablet, Rfl: 3   aspirin EC 81 MG tablet, Take 81 mg by mouth daily., Disp: , Rfl:    EPINEPHrine 0.3 mg/0.3 mL IJ SOAJ injection, Inject into the muscle as directed., Disp: , Rfl:    FREESTYLE LITE test strip, SMARTSIG:Via Meter, Disp: , Rfl:    Glucosamine-Chondroitin (COSAMIN DS PO), Take 1 tablet by mouth daily., Disp: , Rfl:    hydrochlorothiazide (HYDRODIURIL) 25 MG tablet, Take 25 mg by mouth at bedtime. , Disp: , Rfl:    irbesartan (AVAPRO) 150 MG tablet, Take 1 tablet (150 mg total) by mouth daily., Disp: 30 tablet, Rfl: 11   Lancets (FREESTYLE) lancets, 1 each 2 (two) times daily., Disp: , Rfl:    metFORMIN (GLUCOPHAGE) 500 MG tablet, Take 500 mg by mouth 2 (two) times daily., Disp: ,  Rfl:    Multiple Vitamin (MULTIVITAMIN WITH MINERALS) TABS tablet, Take 1 tablet by mouth daily., Disp: , Rfl:    omeprazole (PRILOSEC) 40 MG capsule, Take 40 mg by mouth every other day. , Disp: , Rfl:      SLEEP ARCHITECTURE The study was initiated at 10:27:52 PM and ended at 4:57:59 AM.  Sleep onset time was 5.7 minutes and the sleep efficiency was 83.7%. The total sleep time was 326.4 minutes.  Stage REM latency was 55.0 minutes.  The patient spent 2.30% of the night in stage N1 sleep, 56.64% in stage N2 sleep, 21.60% in stage N3 and 19.5% in REM.  Alpha intrusion was absent.  Supine sleep was 41.67%.  RESPIRATORY PARAMETERS The overall apnea/hypopnea index (AHI) was 13.4 per hour. There were 2 total apneas, including 2 obstructive, 0 central and 0 mixed apneas. There were 71 hypopneas and 0 RERAs.  The AHI during Stage REM sleep was 40.6 per hour.  AHI while supine was 26.9 per hour.  The mean oxygen saturation was 92.97%. The minimum SpO2 during sleep was 73.00%.  loud snoring was noted during this study.  CARDIAC DATA The 2 lead EKG demonstrated sinus rhythm. The mean heart rate was 68.70 beats per minute. Other EKG findings include: None.  LEG MOVEMENT DATA The total PLMS were 172 with a resulting PLMS index of 31.62. Associated arousal with leg movement index was 1.1.  IMPRESSIONS Mild to moderate mostly REM related obstructive sleep apnea is noted. Autopap 8-14 is  recommended.  2.   Moderate periodic limb movements of sleep occurred during the study.    Thomas Metz, MD Diplomate, American Board of Sleep Medicine.  ELECTRONICALLY SIGNED ON:  06/24/2021, 11:18 AM [facility] PH: [phone]   FX: [phone] [place]

## 2021-07-29 NOTE — Progress Notes (Unsigned)
Cardiology Office Note:    Date:  08/08/2021   ID:  Thomas Malone, DOB October 01, 1950, MRN [id number]  PCP:  Lemmie Evens, MD   [facility] Providers Cardiologist:  Freada Bergeron, MD {   Referring MD: Lemmie Evens, MD   CC: Dyspnea on Exertion  History of Present Illness:    Thomas Malone is a 71 y.o. male with a hx of HTN, CKD IIIa, and BPH who presents to clinic for follow-up.  Was initially seen in 04/2021 for dyspnea on exertion. Notably, he had been sedentary for about 4 years after a work injury and started to have significant SOB when he started trying to exert himself again. TTE 05/2021 with LVEF 60-65%, G1DD, no significant valve disease. NM SPECT 04/2021 was low risk with normal EF and likely inferior artifact.   Today, the patient overall feels better than he did at prior visit. Breathing is better but he continues to feel fatigued frequently. Blood pressure has been fluctuating at home running anywhere from 90 to 140 at home. He will take his blood pressure when he feels unwell and it is usually low in the 90s at that time. Also has been feeling lightheaded over the past week which correlates with low blood pressure currently.   Otherwise, he is staying hydrated. No chest pain, SOB, orthopnea. Has occasional LE edema after prolonged standing or driving.    Past Medical History:  Diagnosis Date   Abnormal CT of the chest 01/23/14   BPH (benign prostatic hyperplasia) 0/99/8338   Complication of anesthesia    hard to wake up    Diabetes mellitus without complication (HCC)    Elevated PSA    Erectile dysfunction    Gastroesophageal reflux    H/O sarcoidosis    Headache    7-8 months from gabapentin and naproxen   Hypertension    Pneumonia of upper lobe of lung 12/1213    Past Surgical History:  Procedure Laterality Date   ANTERIOR CERVICAL DECOMP/DISCECTOMY FUSION N/A 05/28/2018   CATARACT EXTRACTION W/PHACO Left 04/11/2019   Procedure: CATARACT  EXTRACTION PHACO AND INTRAOCULAR LENS PLACEMENT (IOC) (CDE:5.80);  Surgeon: Baruch Goldmann, MD;  Location: [facility];  Service: Ophthalmology;  Laterality: Left;   CATARACT EXTRACTION W/PHACO Right 06/03/2019   Procedure: CATARACT EXTRACTION PHACO AND INTRAOCULAR LENS PLACEMENT RIGHT EYE;  Surgeon: Baruch Goldmann, MD;  Location: [facility];  Service: Ophthalmology;  Laterality: Right;  CDE: 9.82   COLONOSCOPY     COLONOSCOPY N/A 06/23/2014   Procedure: COLONOSCOPY;  Surgeon: Aviva Signs Md, MD;  Location: [facility];  Service: Gastroenterology;  Laterality: N/A;   EYE SURGERY Bilateral    lasic   ORTHOPEDIC SURGERY Left    heel surgery   PROSTATE BIOPSY     SHOULDER ARTHROSCOPY Right    repair of tendons and ligaments   TONSILLECTOMY      Current Medications: Current Meds  Medication Sig   alfuzosin (UROXATRAL) 10 MG 24 hr tablet Take 1 tablet (10 mg total) by mouth daily with breakfast.   aspirin EC 81 MG tablet Take 81 mg by mouth daily.   EPINEPHrine 0.3 mg/0.3 mL IJ SOAJ injection Inject into the muscle as directed.   FREESTYLE LITE test strip SMARTSIG:Via Meter   Glucosamine-Chondroitin (COSAMIN DS PO) Take 1 tablet by mouth daily.   hydrochlorothiazide (HYDRODIURIL) 25 MG tablet Take 0.5 tablets (12.5 mg total) by mouth daily.   irbesartan (AVAPRO) 150 MG tablet Take 1 tablet (150 mg total)  by mouth daily.   Lancets (FREESTYLE) lancets 1 each 2 (two) times daily.   metFORMIN (GLUCOPHAGE) 500 MG tablet Take 500 mg by mouth 2 (two) times daily.   Multiple Vitamin (MULTIVITAMIN WITH MINERALS) TABS tablet Take 1 tablet by mouth daily.   omeprazole (PRILOSEC) 40 MG capsule Take 40 mg by mouth every other day.    [DISCONTINUED] hydrochlorothiazide (HYDRODIURIL) 25 MG tablet Take 25 mg by mouth at bedtime.      Allergies:   Diclofenac, Diclofenac sodium, Levaquin [levofloxacin in d5w], and Lidocaine   Social History   Socioeconomic History   Marital status: Married    Spouse name:  Not on file   Number of children: 2   Years of education: Not on file   Highest education level: Not on file  Occupational History   Occupation: concrete finisher  Tobacco Use   Smoking status: Former    Types: Cigars   Smokeless tobacco: Never   Tobacco comments:    and pipe-none since 2010  Vaping Use   Vaping Use: Never used  Substance and Sexual Activity   Alcohol use: No   Drug use: No   Sexual activity: Yes    Birth control/protection: None  Other Topics Concern   Not on file  Social History Narrative   Not on file   Social Determinants of Health   Financial Resource Strain: Not on file  Food Insecurity: Not on file  Transportation Needs: Not on file  Physical Activity: Not on file  Stress: Not on file  Social Connections: Not on file     Family History: The patient's family history includes Cancer in his mother; Diabetes in his brother and sister; Heart disease in his father and sister; Hyperlipidemia in his brother and sister.  ROS:   Please see the history of present illness.    Review of Systems  Constitutional:  Positive for malaise/fatigue.  Respiratory:  Positive for shortness of breath.   Cardiovascular:  Positive for leg swelling. Negative for chest pain, palpitations, orthopnea, claudication and PND.  Gastrointestinal:  Negative for nausea and vomiting.  Musculoskeletal:  Positive for joint pain and myalgias.  Neurological:  Positive for dizziness. Negative for loss of consciousness.  Psychiatric/Behavioral:  Negative for substance abuse.      EKGs/Labs/Other Studies Reviewed:    The following studies were reviewed today: TTE 05/20/21: IMPRESSIONS     1. Left ventricular ejection fraction, by estimation, is 60 to 65%. The  left ventricle has normal function. The left ventricle has no regional  wall motion abnormalities. There is mild left ventricular hypertrophy.  Left ventricular diastolic parameters  are consistent with Grade I diastolic  dysfunction (impaired relaxation).   2. Right ventricular systolic function is normal. The right ventricular  size is normal.   3. The mitral valve is normal in structure. No evidence of mitral valve  regurgitation. No evidence of mitral stenosis.   4. The aortic valve is tricuspid. Aortic valve regurgitation is not  visualized. No aortic stenosis is present.   5. The inferior vena cava is normal in size with greater than 50%  respiratory variability, suggesting right atrial pressure of 3 mmHg.   NM SPECT 04/2021: Perfusion/Defect LV perfusion is abnormal. There is a large moderate intensity inferior defect. The defect overall is fixed with normal wall motion most consistent with subdiaphragmatic attenuation    Chamber Size Left ventricular function is normal. Nuclear stress EF: 56 %. The left ventricular ejection fraction is normal (55-65%). End  diastolic cavity size is normal. No evidence of transient ischemic dilation (TID) noted.    Stress Combined Conclusion The study is normal. The study is low risk.     CTA PE protocol 05/2018: FINDINGS: Cardiovascular: Suboptimal opacification of subsegmental branch vessels in the lower lobes. No acute central pulmonary embolus is seen. Nonaneurysmal aorta. Heart size within normal limits. No pericardial effusion   Mediastinum/Nodes: Midline trachea. No thyroid mass. Edema within the anterior neck soft tissues at the level of the thyroid with few scattered gas bubbles. Esophagus slightly air distended. Mild mediastinal and hilar nodes.   Lungs/Pleura: No acute consolidation or effusion.  No pneumothorax.   Upper Abdomen: Punctate nonobstructing stone upper pole right kidney. 5 cm hypodense mass mid pole right kidney, likely a cyst.   Musculoskeletal: No acute or suspicious abnormality. Degenerative osteophytes of the spine.   Review of the MIP images confirms the above findings.   IMPRESSION: 1. Suboptimal opacification of  subsegmental branch vessels in the lower lobes limits evaluation. No acute central pulmonary embolus is visualized. 2. Mild edema within the anterior neck soft tissues at the level of thyroid, which may be secondary to history of recent surgery 3. Clear lung fields 4. Scattered mediastinal and hilar lymph nodes, patient has history of sarcoidosis 5. Punctate nonobstructing stone upper pole right kidney    EKG:  EKG is  ordered today.  The ekg ordered today demonstrates NSR, RBBB, PVC  Recent Labs: 03/28/2021: ALT 12; BNP 14.8; BUN 25; Creatinine, Ser 1.91; Hemoglobin 12.4; Platelets 274; Potassium 4.4; Sodium 141  Recent Lipid Panel No results found for: "CHOL", "TRIG", "HDL", "CHOLHDL", "VLDL", "LDLCALC", "LDLDIRECT"   Risk Assessment/Calculations:           Physical Exam:    VS:  BP 104/74   Pulse 84   Ht '5\' 10"'$  (1.778 m)   Wt 237 lb (107.5 kg)   SpO2 94%   BMI 34.01 kg/m     Wt Readings from Last 3 Encounters:  08/08/21 237 lb (107.5 kg)  05/09/21 237 lb 6.4 oz (107.7 kg)  04/15/21 236 lb (107 kg)     GEN:  Well nourished, well developed in no acute distress HEENT: Normal NECK: No JVD; No carotid bruits CARDIAC: RRR, no murmurs, rubs, gallops RESPIRATORY:  Clear to auscultation without rales, wheezing or rhonchi  ABDOMEN: Soft, non-tender, non-distended MUSCULOSKELETAL:  No edema; No deformity  SKIN: Warm and dry NEUROLOGIC:  Alert and oriented x 3 PSYCHIATRIC:  Normal affect   ASSESSMENT:    1. SOB (shortness of breath)   2. Primary hypertension   3. Pulmonary sarcoidosis (HCC)   4. Stage 3b chronic kidney disease ([place])     PLAN:    In order of problems listed above:  #Dyspnea on Exertion: Reassuring cardiac work-up with normal myoview. TTE with normal BiV function with no significant valve disease. May be related to deconditioning given he was more sedentary after work injury. -Reassuring cardiac work-up -Increase activity as  tolerated  #HTN: Having episodes of symptomatic low blood pressure at home. Will decrease HCTZ to 12.'5mg'$  daily and have him send in BP log for [place] to review -Continue irbesartan '150mg'$  daily -Decrease HCTZ to 12.'5mg'$  daily -Keep BP log for use to review and can adjust further as needed  #Remote history of pulmonary sarcoid: -Follow-up with Dr. Melvyn Novas as scheduled  #CKD IIIB: -Follow-up with Nephrology -BP management as above  #CV risk stratification: -Lipids followed by primary -Lifestyle modifications discussed as below  Exercise recommendations: Goal of exercising for at least 30 minutes a day, at least 5 times per week.  Please exercise to a moderate exertion.  This means that while exercising it is difficult to speak in full sentences, however you are not so short of breath that you feel you must stop, and not so comfortable that you can carry on a full conversation.  Exertion level should be approximately a 5/10, if 10 is the most exertion you can perform.  Diet recommendations: Recommend a heart healthy diet such as the Mediterranean diet.  This diet consists of plant based foods, healthy fats, lean meats, olive oil.  It suggests limiting the intake of simple carbohydrates such as white breads, pastries, and pastas.  It also limits the amount of red meat, wine, and dairy products such as cheese that one should consume on a daily basis.          Medication Adjustments/Labs and Tests Ordered: Current medicines are reviewed at length with the patient today.  Concerns regarding medicines are outlined above.  No orders of the defined types were placed in this encounter.  Meds ordered this encounter  Medications   hydrochlorothiazide (HYDRODIURIL) 25 MG tablet    Sig: Take 0.5 tablets (12.5 mg total) by mouth daily.    Dispense:  45 tablet    Refill:  3    08/08/21 decreased to 12.5 mg qd    Patient Instructions  Medication Instructions:  DECREASE HCTZ to 12.5 mg ,take in the  am  Labwork:  None today  Testing/Procedures: None today  Follow-Up: 6 months  Any Other Special Instructions Will Be Listed Below (If Applicable).  If you need a refill on your cardiac medications before your next appointment, please call your pharmacy.    Signed, Freada Bergeron, MD  08/08/2021 3:26 PM    [place]

## 2021-08-08 ENCOUNTER — Ambulatory Visit: Payer: PPO | Admitting: Cardiology

## 2021-08-08 ENCOUNTER — Encounter: Payer: Self-pay | Admitting: Cardiology

## 2021-08-08 VITALS — BP 104/74 | HR 84 | Ht 70.0 in | Wt 237.0 lb

## 2021-08-08 DIAGNOSIS — N1832 Chronic kidney disease, stage 3b: Secondary | ICD-10-CM

## 2021-08-08 DIAGNOSIS — I10 Essential (primary) hypertension: Secondary | ICD-10-CM

## 2021-08-08 DIAGNOSIS — D860 Sarcoidosis of lung: Secondary | ICD-10-CM

## 2021-08-08 DIAGNOSIS — R0602 Shortness of breath: Secondary | ICD-10-CM

## 2021-08-08 MED ORDER — HYDROCHLOROTHIAZIDE 25 MG PO TABS: 12.5000 mg | ORAL_TABLET | Freq: Every day | ORAL | 3 refills | Status: DC

## 2021-08-08 NOTE — Patient Instructions (Signed)
Medication Instructions:  DECREASE HCTZ to 12.5 mg ,take in the am  Labwork:  None today  Testing/Procedures: None today  Follow-Up: 6 months  Any Other Special Instructions Will Be Listed Below (If Applicable).  If you need a refill on your cardiac medications before your next appointment, please call your pharmacy.

## 2021-08-16 ENCOUNTER — Encounter: Payer: Self-pay | Admitting: Cardiology

## 2021-10-04 ENCOUNTER — Other Ambulatory Visit: Payer: PPO

## 2021-10-04 DIAGNOSIS — R972 Elevated prostate specific antigen [PSA]: Secondary | ICD-10-CM

## 2021-10-05 LAB — PSA: Prostate Specific Ag, Serum: 9.2 ng/mL — ABNORMAL HIGH (ref 0.0–4.0)

## 2021-10-11 ENCOUNTER — Encounter: Payer: Self-pay | Admitting: Urology

## 2021-10-11 ENCOUNTER — Ambulatory Visit: Payer: PPO | Admitting: Urology

## 2021-10-11 VITALS — BP 147/87 | HR 68

## 2021-10-11 DIAGNOSIS — N401 Enlarged prostate with lower urinary tract symptoms: Secondary | ICD-10-CM

## 2021-10-11 DIAGNOSIS — R972 Elevated prostate specific antigen [PSA]: Secondary | ICD-10-CM | POA: Diagnosis not present

## 2021-10-11 DIAGNOSIS — N529 Male erectile dysfunction, unspecified: Secondary | ICD-10-CM | POA: Diagnosis not present

## 2021-10-11 DIAGNOSIS — R351 Nocturia: Secondary | ICD-10-CM | POA: Diagnosis not present

## 2021-10-11 DIAGNOSIS — N138 Other obstructive and reflux uropathy: Secondary | ICD-10-CM

## 2021-10-11 LAB — URINALYSIS, ROUTINE W REFLEX MICROSCOPIC
Bilirubin, UA: NEGATIVE
Glucose, UA: NEGATIVE
Ketones, UA: NEGATIVE
Leukocytes,UA: NEGATIVE
Nitrite, UA: NEGATIVE
Protein,UA: NEGATIVE
RBC, UA: NEGATIVE
Specific Gravity, UA: 1.01 (ref 1.005–1.030)
Urobilinogen, Ur: 0.2 mg/dL (ref 0.2–1.0)
pH, UA: 5 (ref 5.0–7.5)

## 2021-10-11 MED ORDER — MIRABEGRON ER 25 MG PO TB24: 25.0000 mg | ORAL_TABLET | Freq: Every day | ORAL | 0 refills | Status: DC

## 2021-10-11 MED ORDER — FINASTERIDE 5 MG PO TABS: 5.0000 mg | ORAL_TABLET | Freq: Every day | ORAL | 3 refills | Status: DC

## 2021-10-11 MED ORDER — ALFUZOSIN HCL ER 10 MG PO TB24: 10.0000 mg | ORAL_TABLET | Freq: Every day | ORAL | 3 refills | Status: DC

## 2021-10-11 NOTE — Progress Notes (Signed)
10/11/2021 9:46 AM   Thomas Malone [birth year] [id number]  Referring provider: Lemmie Evens, Malone Thomas Malone,   [id number]  Followup elevated PSA   HPI: [name] is a 71yo here for followup for elevated PSA and BPh with nocturia. PSA decreased to 9.2. He started finasteride last visit. He is on uroxatral '10mg'$  daily. He has bothersome urinary frequency and urinary urgency. NO urge incontinence.    PMH: Past Medical History:  Diagnosis Date   Abnormal CT of the chest 01/23/14   BPH (benign prostatic hyperplasia) 6/38/7564   Complication of anesthesia    hard to wake up    Diabetes mellitus without complication (HCC)    Elevated PSA    Erectile dysfunction    Gastroesophageal reflux    H/O sarcoidosis    Headache    7-8 months from gabapentin and naproxen   Hypertension    Pneumonia of upper lobe of lung 12/1213    Surgical History: Past Surgical History:  Procedure Laterality Date   ANTERIOR CERVICAL DECOMP/DISCECTOMY FUSION N/A 05/28/2018   CATARACT EXTRACTION W/PHACO Left 04/11/2019   Procedure: CATARACT EXTRACTION PHACO AND INTRAOCULAR LENS PLACEMENT (IOC) (CDE:5.80);  Surgeon: Thomas Goldmann, Malone;  Location: [facility];  Service: Ophthalmology;  Laterality: Left;   CATARACT EXTRACTION W/PHACO Right 06/03/2019   Procedure: CATARACT EXTRACTION PHACO AND INTRAOCULAR LENS PLACEMENT RIGHT EYE;  Surgeon: Thomas Goldmann, Malone;  Location: [facility];  Service: Ophthalmology;  Laterality: Right;  CDE: 9.82   COLONOSCOPY     COLONOSCOPY N/A 06/23/2014   Procedure: COLONOSCOPY;  Surgeon: Thomas Signs Malone, Malone;  Location: [facility];  Service: Gastroenterology;  Laterality: N/A;   EYE SURGERY Bilateral    lasic   ORTHOPEDIC SURGERY Left    heel surgery   PROSTATE BIOPSY     SHOULDER ARTHROSCOPY Right    repair of tendons and ligaments   TONSILLECTOMY      Home Medications:  Allergies as of 10/11/2021       Reactions   Diclofenac Anaphylaxis   Diclofenac Sodium  Anaphylaxis   Topical gel   Levaquin [levofloxacin In D5w] Anaphylaxis   Lidocaine Anaphylaxis        Medication List        Accurate as of October 11, 2021  9:46 AM. If you have any questions, ask your nurse or doctor.          alfuzosin 10 MG 24 hr tablet Commonly known as: UROXATRAL Take 1 tablet (10 mg total) by mouth daily with breakfast.   aspirin EC 81 MG tablet Take 81 mg by mouth daily.   celecoxib 200 MG capsule Commonly known as: CELEBREX Take 200 mg by mouth daily.   COSAMIN DS PO Take 1 tablet by mouth daily.   EPINEPHrine 0.3 mg/0.3 mL Soaj injection Commonly known as: EPI-PEN Inject into the muscle as directed.   finasteride 5 MG tablet Commonly known as: PROSCAR Take 5 mg by mouth daily.   freestyle lancets 1 each 2 (two) times daily.   FREESTYLE LITE test strip Generic drug: glucose blood SMARTSIG:Via Meter   hydrochlorothiazide 25 MG tablet Commonly known as: HYDRODIURIL Take 0.5 tablets (12.5 mg total) by mouth daily.   irbesartan 150 MG tablet Commonly known as: Avapro Take 1 tablet (150 mg total) by mouth daily.   metFORMIN 500 MG tablet Commonly known as: GLUCOPHAGE Take 500 mg by mouth 2 (two) times daily.   multivitamin with minerals Tabs tablet Take 1 tablet by  mouth daily.   omeprazole 40 MG capsule Commonly known as: PRILOSEC Take 40 mg by mouth every other day.   predniSONE 10 MG tablet Commonly known as: DELTASONE Take by mouth.        Allergies:  Allergies  Allergen Reactions   Diclofenac Anaphylaxis   Diclofenac Sodium Anaphylaxis    Topical gel   Levaquin [Levofloxacin In D5w] Anaphylaxis   Lidocaine Anaphylaxis    Family History: Family History  Problem Relation Age of Onset   Cancer Mother    Heart disease Father    Hyperlipidemia Sister    Diabetes Sister    Heart disease Sister    Hyperlipidemia Brother    Diabetes Brother     Social History:  reports that he has quit smoking. His  smoking use included cigars. He has never used smokeless tobacco. He reports that he does not drink alcohol and does not use drugs.  ROS: All other review of systems were reviewed and are negative except what is noted above in HPI  Physical Exam: BP (!) 147/87   Pulse 68   Constitutional:  Alert and oriented, No acute distress. HEENT: [place] AT, moist mucus membranes.  Trachea midline, no masses. Cardiovascular: No clubbing, cyanosis, or edema. Respiratory: Normal respiratory effort, no increased work of breathing. GI: Abdomen is soft, nontender, nondistended, no abdominal masses GU: No CVA tenderness.  Lymph: No cervical or inguinal lymphadenopathy. Skin: No rashes, bruises or suspicious lesions. Neurologic: Grossly intact, no focal deficits, moving all 4 extremities. Psychiatric: Normal mood and affect.  Laboratory Data: Lab Results  Component Value Date   WBC 5.9 03/28/2021   HGB 12.4 (L) 03/28/2021   HCT 37.0 (L) 03/28/2021   MCV 81 03/28/2021   PLT 274 03/28/2021    Lab Results  Component Value Date   CREATININE 1.91 (H) 03/28/2021    Lab Results  Component Value Date   PSA 6.7 (H) 06/22/2019    No results found for: "TESTOSTERONE"  Lab Results  Component Value Date   HGBA1C 6.8 (H) 04/07/2019    Urinalysis    Component Value Date/Time   COLORURINE STRAW (A) 11/15/2016 0617   APPEARANCEUR Clear 08/15/2020 1133   LABSPEC 1.009 11/15/2016 0617   PHURINE 7.0 11/15/2016 0617   GLUCOSEU Negative 08/15/2020 1133   HGBUR NEGATIVE 11/15/2016 0617   BILIRUBINUR Negative 08/15/2020 1133   KETONESUR NEGATIVE 11/15/2016 0617   PROTEINUR Negative 08/15/2020 1133   PROTEINUR NEGATIVE 11/15/2016 0617   UROBILINOGEN 0.2 06/22/2019 1104   NITRITE Negative 08/15/2020 1133   NITRITE NEGATIVE 11/15/2016 0617   LEUKOCYTESUR Negative 08/15/2020 1133    Lab Results  Component Value Date   LABMICR Comment 08/15/2020    Pertinent Imaging:  No results found for this or  any previous visit.  No results found for this or any previous visit.  No results found for this or any previous visit.  No results found for this or any previous visit.  Results for orders placed during the hospital encounter of 12/24/20  US RENAL  Narrative CLINICAL DATA:  Chronic kidney disease.  EXAM: RENAL / URINARY TRACT ULTRASOUND COMPLETE  COMPARISON:  None.  FINDINGS: Right Kidney:  Renal measurements: 11.5 x 6.1 x 5.2 cm = volume: 188 mL. There is diffuse increased renal parenchymal echogenicity. No hydronephrosis or shadowing stone. There is a 5 cm upper pole cyst.  Left Kidney:  Renal measurements: 11.9 x 6.6 x 5.4 cm = volume: 222 mL. Normal echogenicity. No hydronephrosis or shadowing  stone.  Bladder:  Appears normal for degree of bladder distention.  Other:  None.  IMPRESSION: 1. Echogenic right kidney a 5 cm upper pole cyst. No hydronephrosis or shadowing stone. 2. Unremarkable left kidney and urinary bladder.   Electronically Signed By: Anner Crete M.D. On: 12/24/2020 23:22  No results found for this or any previous visit.  No results found for this or any previous visit.  No results found for this or any previous visit.   Assessment & Plan:    1. Elevated PSA -RTC 6 months with PSA - Urinalysis, Routine w reflex microscopic   2. BPh with LUTS, nocturia -We will trial mirabegron '25mg'$  daily -continue uroxatral1 0gm and finasteride '5mg'$    No follow-ups on file.  Nicolette Bang, Malone  [facility] [place]

## 2021-10-11 NOTE — Patient Instructions (Signed)

## 2021-10-20 NOTE — Progress Notes (Unsigned)
[place] [phone]   Fax:([phone] [place] NOTE  Patient Care Team: Lemmie Evens, MD as PCP - General (Family Medicine) Freada Bergeron, MD as PCP - Cardiology (Cardiology) [name], MD as Consulting Physician (Cardiothoracic Surgery)  Hematological/Oncological History 1) Labs from Dr. Theador Hawthorne from [place]: --12/20/2020 and 04/12/2021: SPEP/IFE showed poorly defined band of restricted protein mobility is detect in the gamma globulins. Urine IFE showed poorly defined area of restricted protein mobility detected and is reactive with kappa light chain antisera.   2) Establish care with [facility] Hematology   CHIEF COMPLAINTS/PURPOSE OF CONSULTATION:  Evaluate for monoclonal gammopathy  HISTORY OF PRESENTING ILLNESS:  Thomas Malone 71 y.o. male with medical history significant for CKD, BPH, diabetes, GERD and sarcoidosis. He presents to the hematology clinic for monoclonal gammopathy. He is accompanied by his wife for this visit.   On exam today, Mr. Gnau reports that he is easily fatigued but continues to complete his daily activities on his own. He adds that after he was injured at work several years ago, he became more sedentary. He denies any appetite changes or weight loss. He denies nausea, vomiting or abdominal pain. His bowel habits are unchanged without any recurrent episodes of diarrhea or constipation. He struggles with urinary frequency and urgency. He was prescribed alfuzosin earlier in the month and discontinued after one week as he felt his symptoms worsened. He reports chronic shortness of breath with exertion but none at rest. He has chronic neck and back pain. He denies fevers, chills,sweats, chest pain or cough. He has no other complaints.   MEDICAL HISTORY:  Past Medical History:  Diagnosis Date   Abnormal CT of the chest 01/23/14   BPH (benign prostatic hyperplasia) 4/62/7035    Complication of anesthesia    hard to wake up    Diabetes mellitus without complication (HCC)    Elevated PSA    Erectile dysfunction    Gastroesophageal reflux    H/O sarcoidosis    Headache    7-8 months from gabapentin and naproxen   Hypertension    Pneumonia of upper lobe of lung 12/1213    SURGICAL HISTORY: Past Surgical History:  Procedure Laterality Date   ANTERIOR CERVICAL DECOMP/DISCECTOMY FUSION N/A 05/28/2018   CATARACT EXTRACTION W/PHACO Left 04/11/2019   Procedure: CATARACT EXTRACTION PHACO AND INTRAOCULAR LENS PLACEMENT (IOC) (CDE:5.80);  Surgeon: Baruch Goldmann, MD;  Location: [facility];  Service: Ophthalmology;  Laterality: Left;   CATARACT EXTRACTION W/PHACO Right 06/03/2019   Procedure: CATARACT EXTRACTION PHACO AND INTRAOCULAR LENS PLACEMENT RIGHT EYE;  Surgeon: Baruch Goldmann, MD;  Location: [facility];  Service: Ophthalmology;  Laterality: Right;  CDE: 9.82   COLONOSCOPY     COLONOSCOPY N/A 06/23/2014   Procedure: COLONOSCOPY;  Surgeon: Aviva Signs Md, MD;  Location: [facility];  Service: Gastroenterology;  Laterality: N/A;   EYE SURGERY Bilateral    lasic   ORTHOPEDIC SURGERY Left    heel surgery   PROSTATE BIOPSY     SHOULDER ARTHROSCOPY Right    repair of tendons and ligaments   TONSILLECTOMY      SOCIAL HISTORY: Social History   Socioeconomic History   Marital status: Married    Spouse name: Not on file   Number of children: 2   Years of education: Not on file   Highest education level: Not on file  Occupational History   Occupation: concrete finisher  Tobacco Use   Smoking status: Former  Types: Cigars   Smokeless tobacco: Never   Tobacco comments:    and pipe-none since 2010  Vaping Use   Vaping Use: Never used  Substance and Sexual Activity   Alcohol use: No   Drug use: No   Sexual activity: Yes    Birth control/protection: None  Other Topics Concern   Not on file  Social History Narrative   Not on file   Social Determinants of  Health   Financial Resource Strain: Not on file  Food Insecurity: Not on file  Transportation Needs: Not on file  Physical Activity: Not on file  Stress: Not on file  Social Connections: Not on file  Intimate Partner Violence: Not on file    FAMILY HISTORY: Family History  Problem Relation Age of Onset   Lymphoma Mother    Heart disease Father    Hyperlipidemia Sister    Diabetes Sister    Heart disease Sister    Breast cancer Sister    Hyperlipidemia Brother    Diabetes Brother     ALLERGIES:  is allergic to diclofenac, diclofenac sodium, levaquin [levofloxacin in d5w], and lidocaine.  MEDICATIONS:  Current Outpatient Medications  Medication Sig Dispense Refill   alfuzosin (UROXATRAL) 10 MG 24 hr tablet Take 1 tablet (10 mg total) by mouth daily with breakfast. 90 tablet 3   aspirin EC 81 MG tablet Take 81 mg by mouth daily.     celecoxib (CELEBREX) 200 MG capsule Take 200 mg by mouth daily.     EPINEPHrine 0.3 mg/0.3 mL IJ SOAJ injection Inject into the muscle as directed.     finasteride (PROSCAR) 5 MG tablet Take 1 tablet (5 mg total) by mouth daily. 90 tablet 3   FREESTYLE LITE test strip SMARTSIG:Via Meter     Glucosamine-Chondroitin (COSAMIN DS PO) Take 1 tablet by mouth daily.     hydrochlorothiazide (HYDRODIURIL) 25 MG tablet Take 0.5 tablets (12.5 mg total) by mouth daily. 45 tablet 3   irbesartan (AVAPRO) 150 MG tablet Take 1 tablet (150 mg total) by mouth daily. 30 tablet 11   Lancets (FREESTYLE) lancets 1 each 2 (two) times daily.     metFORMIN (GLUCOPHAGE) 500 MG tablet Take 500 mg by mouth 2 (two) times daily.     mirabegron ER (MYRBETRIQ) 25 MG TB24 tablet Take 1 tablet (25 mg total) by mouth daily. 30 tablet 0   Multiple Vitamin (MULTIVITAMIN WITH MINERALS) TABS tablet Take 1 tablet by mouth daily.     omeprazole (PRILOSEC) 40 MG capsule Take 40 mg by mouth every other day.      No current facility-administered medications for this visit.    REVIEW OF  SYSTEMS:   Constitutional: ( - ) fevers, ( - )  chills , ( - ) night sweats Eyes: ( - ) blurriness of vision, ( - ) double vision, ( - ) watery eyes Ears, nose, mouth, throat, and face: ( - ) mucositis, ( - ) sore throat Respiratory: ( - ) cough, ( + ) dyspnea, ( - ) wheezes Cardiovascular: ( - ) palpitation, ( - ) chest discomfort, ( - ) lower extremity swelling Gastrointestinal:  ( - ) nausea, ( - ) heartburn, ( - ) change in bowel habits Skin: ( - ) abnormal skin rashes Lymphatics: ( - ) new lymphadenopathy, ( - ) easy bruising Neurological: ( - ) numbness, ( - ) tingling, ( - ) new weaknesses Behavioral/Psych: ( - ) mood change, ( - ) new changes  All other systems were reviewed with the patient and are negative.  PHYSICAL EXAMINATION: ECOG PERFORMANCE STATUS: 1 - Symptomatic but completely ambulatory  Vitals:   10/21/21 1306  BP: (!) 143/90  Pulse: 67  Resp: 18  Temp: 97.9 F (36.6 C)  SpO2: 100%   Filed Weights   10/21/21 1306  Weight: 240 lb 9.6 oz (109.1 kg)    GENERAL: well appearing male in NAD  SKIN: skin color, texture, turgor are normal, no rashes or significant lesions EYES: conjunctiva are pink and non-injected, sclera clear OROPHARYNX: no exudate, no erythema; lips, buccal mucosa, and tongue normal  NECK: supple, non-tender LYMPH:  no palpable lymphadenopathy in the cervical or supraclavicular lymph nodes.  LUNGS: clear to auscultation and percussion with normal breathing effort HEART: regular rate & rhythm and no murmurs and no lower extremity edema ABDOMEN: soft, non-tender, non-distended, normal bowel sounds Musculoskeletal: no cyanosis of digits and no clubbing  PSYCH: alert & oriented x 3, fluent speech NEURO: no focal motor/sensory deficits  LABORATORY DATA:  I have reviewed the data as listed    Latest Ref Rng & Units 10/21/2021    2:01 PM 03/28/2021   10:08 AM 05/31/2018    4:01 AM  CBC  WBC 4.0 - 10.5 K/uL 6.2  5.9  13.4   Hemoglobin 13.0 -  17.0 g/dL 11.7  12.4  13.3   Hematocrit 39.0 - 52.0 % 34.5  37.0  37.7   Platelets 150 - 400 K/uL 196  274  242        Latest Ref Rng & Units 10/21/2021    2:01 PM 03/28/2021   10:09 AM 03/28/2021   10:08 AM  CMP  Glucose 70 - 99 mg/dL 229  133    BUN 8 - 23 mg/dL 19  25    Creatinine 0.61 - 1.24 mg/dL 1.88  1.91    Sodium 135 - 145 mmol/L 138  141    Potassium 3.5 - 5.1 mmol/L 4.1  4.4    Chloride 98 - 111 mmol/L 107  104    CO2 22 - 32 mmol/L 25  24    Calcium 8.9 - 10.3 mg/dL 8.9  9.8    Total Protein 6.5 - 8.1 g/dL 6.6   7.0   Total Bilirubin 0.3 - 1.2 mg/dL 0.6   0.4   Alkaline Phos 38 - 126 U/L 59   87   AST 15 - 41 U/L 19   15   ALT 0 - 44 U/L 19   12    ASSESSMENT & PLAN Thomas Malone is a 71 y.o. male who presents to the hematology clinic for evaluation for monoclonal gammopathy. I reviewed the proposed workup which includes serologic evaluation today to check CBC, CMP, LDH, beta-2 microglobulin, SPEP with IFE, and serum free light chains. Additionally, we will request 24 hour UPEP. If there is evidence of monoclonal gammopathy detected, we will request bone met survey to evaluate for lytic lesions.  We will see patient back in two weeks to review results and determine if bone marrow biopsy is needed.   Patient and his wife expressed understanding and satisfaction with the plan provided.    Orders Placed This Encounter  Procedures   CBC with Differential    Standing Status:   Future    Number of Occurrences:   1    Standing Expiration Date:   10/20/2022   Comprehensive metabolic panel    Standing Status:   Future  Number of Occurrences:   1    Standing Expiration Date:   10/20/2022   Multiple Myeloma Panel (SPEP&IFE w/QIG)    Standing Status:   Future    Number of Occurrences:   1    Standing Expiration Date:   10/20/2022   Kappa/lambda light chains    Standing Status:   Future    Number of Occurrences:   1    Standing Expiration Date:   10/20/2022   Beta 2  microglobuline, serum    Standing Status:   Future    Number of Occurrences:   1    Standing Expiration Date:   10/20/2022   Lactate dehydrogenase    Standing Status:   Future    Number of Occurrences:   1    Standing Expiration Date:   10/20/2022   24 Hr Urn UIFE/Light Chains/TP QN    Standing Status:   Future    Standing Expiration Date:   10/21/2022    All questions were answered. The patient knows to call the clinic with any problems, questions or concerns.  I have spent a total of 60 minutes minutes of face-to-face and non-face-to-face time, preparing to see the patient, obtaining and/or reviewing separately obtained history, performing a medically appropriate examination, counseling and educating the patient, ordering tests/procedures, documenting clinical information in the electronic health record and care coordination.   Dede Query, PA-C Department of Hematology/Oncology [place]  Phone: [phone]

## 2021-10-21 ENCOUNTER — Encounter: Payer: Self-pay | Admitting: Physician Assistant

## 2021-10-21 ENCOUNTER — Inpatient Hospital Stay: Payer: PPO | Attending: Physician Assistant | Admitting: Physician Assistant

## 2021-10-21 ENCOUNTER — Inpatient Hospital Stay: Payer: PPO

## 2021-10-21 VITALS — BP 143/90 | HR 67 | Temp 97.9°F | Resp 18 | Ht 70.0 in | Wt 240.6 lb

## 2021-10-21 DIAGNOSIS — I129 Hypertensive chronic kidney disease with stage 1 through stage 4 chronic kidney disease, or unspecified chronic kidney disease: Secondary | ICD-10-CM | POA: Diagnosis not present

## 2021-10-21 DIAGNOSIS — D472 Monoclonal gammopathy: Secondary | ICD-10-CM | POA: Insufficient documentation

## 2021-10-21 DIAGNOSIS — N189 Chronic kidney disease, unspecified: Secondary | ICD-10-CM | POA: Insufficient documentation

## 2021-10-21 DIAGNOSIS — Z87891 Personal history of nicotine dependence: Secondary | ICD-10-CM | POA: Diagnosis not present

## 2021-10-21 DIAGNOSIS — D869 Sarcoidosis, unspecified: Secondary | ICD-10-CM | POA: Insufficient documentation

## 2021-10-21 DIAGNOSIS — E1122 Type 2 diabetes mellitus with diabetic chronic kidney disease: Secondary | ICD-10-CM | POA: Diagnosis not present

## 2021-10-21 LAB — CBC WITH DIFFERENTIAL/PLATELET
Abs Immature Granulocytes: 0.01 10*3/uL (ref 0.00–0.07)
Basophils Absolute: 0 10*3/uL (ref 0.0–0.1)
Basophils Relative: 1 %
Eosinophils Absolute: 0.3 10*3/uL (ref 0.0–0.5)
Eosinophils Relative: 5 %
HCT: 34.5 % — ABNORMAL LOW (ref 39.0–52.0)
Hemoglobin: 11.7 g/dL — ABNORMAL LOW (ref 13.0–17.0)
Immature Granulocytes: 0 %
Lymphocytes Relative: 27 %
Lymphs Abs: 1.7 10*3/uL (ref 0.7–4.0)
MCH: 27.9 pg (ref 26.0–34.0)
MCHC: 33.9 g/dL (ref 30.0–36.0)
MCV: 82.1 fL (ref 80.0–100.0)
Monocytes Absolute: 0.5 10*3/uL (ref 0.1–1.0)
Monocytes Relative: 8 %
Neutro Abs: 3.7 10*3/uL (ref 1.7–7.7)
Neutrophils Relative %: 59 %
Platelets: 196 10*3/uL (ref 150–400)
RBC: 4.2 MIL/uL — ABNORMAL LOW (ref 4.22–5.81)
RDW: 12.6 % (ref 11.5–15.5)
WBC: 6.2 10*3/uL (ref 4.0–10.5)
nRBC: 0 % (ref 0.0–0.2)

## 2021-10-21 LAB — COMPREHENSIVE METABOLIC PANEL
ALT: 19 U/L (ref 0–44)
AST: 19 U/L (ref 15–41)
Albumin: 3.5 g/dL (ref 3.5–5.0)
Alkaline Phosphatase: 59 U/L (ref 38–126)
Anion gap: 6 (ref 5–15)
BUN: 19 mg/dL (ref 8–23)
CO2: 25 mmol/L (ref 22–32)
Calcium: 8.9 mg/dL (ref 8.9–10.3)
Chloride: 107 mmol/L (ref 98–111)
Creatinine, Ser: 1.88 mg/dL — ABNORMAL HIGH (ref 0.61–1.24)
GFR, Estimated: 38 mL/min — ABNORMAL LOW (ref >60)
Glucose, Bld: 229 mg/dL — ABNORMAL HIGH (ref 70–99)
Potassium: 4.1 mmol/L (ref 3.5–5.1)
Sodium: 138 mmol/L (ref 135–145)
Total Bilirubin: 0.6 mg/dL (ref 0.3–1.2)
Total Protein: 6.6 g/dL (ref 6.5–8.1)

## 2021-10-21 LAB — LACTATE DEHYDROGENASE: LDH: 144 U/L (ref 98–192)

## 2021-10-22 LAB — KAPPA/LAMBDA LIGHT CHAINS
Kappa free light chain: 42.1 mg/L — ABNORMAL HIGH (ref 3.3–19.4)
Kappa, lambda light chain ratio: 1.19 (ref 0.26–1.65)
Lambda free light chains: 35.4 mg/L — ABNORMAL HIGH (ref 5.7–26.3)

## 2021-10-22 LAB — BETA 2 MICROGLOBULIN, SERUM: Beta-2 Microglobulin: 2.6 mg/L — ABNORMAL HIGH (ref 0.6–2.4)

## 2021-10-23 DIAGNOSIS — D472 Monoclonal gammopathy: Secondary | ICD-10-CM | POA: Diagnosis not present

## 2021-10-25 LAB — MULTIPLE MYELOMA PANEL, SERUM
Albumin SerPl Elph-Mcnc: 3.4 g/dL (ref 2.9–4.4)
Albumin/Glob SerPl: 1.3 (ref 0.7–1.7)
Alpha 1: 0.2 g/dL (ref 0.0–0.4)
Alpha2 Glob SerPl Elph-Mcnc: 0.6 g/dL (ref 0.4–1.0)
B-Globulin SerPl Elph-Mcnc: 0.9 g/dL (ref 0.7–1.3)
Gamma Glob SerPl Elph-Mcnc: 1 g/dL (ref 0.4–1.8)
Globulin, Total: 2.7 g/dL (ref 2.2–3.9)
IgA: 253 mg/dL (ref 61–437)
IgG (Immunoglobin G), Serum: 1085 mg/dL (ref 603–1613)
IgM (Immunoglobulin M), Srm: 73 mg/dL (ref 20–172)
Total Protein ELP: 6.1 g/dL (ref 6.0–8.5)

## 2021-10-25 LAB — UIFE/LIGHT CHAINS/TP QN, 24-HR UR
FR KAPPA LT CH,24HR: 250.9 mg/24 hr
FR LAMBDA LT CH,24HR: 61.99 mg/24 hr
Free Kappa Lt Chains,Ur: 105.64 mg/L — ABNORMAL HIGH (ref 1.17–86.46)
Free Kappa/Lambda Ratio: 4.05 (ref 1.83–14.26)
Free Lambda Lt Chains,Ur: 26.1 mg/L — ABNORMAL HIGH (ref 0.27–15.21)
Total Protein, Urine-Ur/day: 242 mg/24 hr — ABNORMAL HIGH (ref 30–150)
Total Protein, Urine: 10.2 mg/dL
Total Volume: 2375

## 2021-11-06 NOTE — Progress Notes (Unsigned)
[place], [place] [address]   CLINIC:  Medical Oncology/Hematology  PCP:  Lemmie Evens, MD [place] [address] [phone]   REASON FOR VISIT:  Follow-up for abnormal SPEP  PRIOR THERAPY: None  CURRENT THERAPY: Under work-up  INTERVAL HISTORY:  Thomas Malone 71 y.o. male returns for routine follow-up of abnormal SPEP.  He was seen for initial consultation by Dede Query PA-C on 10/21/2021.  At today's visit, he reports feeling fairly well. He denies any changes in his baseline health status or symptoms since his initial visit 3 weeks ago.  He denies any new bone pain or recent fractures.  He denies any B symptoms such as fever, chills, night sweats, unintentional weight loss.  He has some chronic tinnitus that has been somewhat worse recently, currently being worked up by primary care provider.  He has intermittent headaches and blurry vision.  He has chronic right hand numbness following arm surgery 2 years ago.  No thromboembolic events since his last visit.  No new masses or lymphadenopathy per his report.   He continues to report that he is easily fatigued but able to complete his daily activities independently.  He continues to have urinary frequency and urgency secondary to his BPH.  He reports chronic shortness of breath with exertion but not at rest.  He has chronic neck and back pain.  He has 75% energy and 90% appetite. He endorses that he is maintaining a stable weight.   REVIEW OF SYSTEMS:  Review of Systems  Constitutional:  Positive for fatigue. Negative for appetite change, chills, diaphoresis, fever and unexpected weight change.  HENT:   Negative for lump/mass and nosebleeds.   Eyes:  Negative for eye problems.  Respiratory:  Positive for shortness of breath. Negative for cough and hemoptysis.   Cardiovascular:  Negative for chest pain, leg swelling and palpitations.  Gastrointestinal:  Negative for  abdominal pain, blood in stool, constipation, diarrhea, nausea and vomiting.  Genitourinary:  Positive for difficulty urinating. Negative for hematuria.   Skin: Negative.   Neurological:  Positive for headaches and numbness. Negative for dizziness and light-headedness.  Hematological:  Does not bruise/bleed easily.      PAST MEDICAL/SURGICAL HISTORY:  Past Medical History:  Diagnosis Date   Abnormal CT of the chest 01/23/14   BPH (benign prostatic hyperplasia) 7/34/0370   Complication of anesthesia    hard to wake up    Diabetes mellitus without complication (HCC)    Elevated PSA    Erectile dysfunction    Gastroesophageal reflux    H/O sarcoidosis    Headache    7-8 months from gabapentin and naproxen   Hypertension    Pneumonia of upper lobe of lung 12/1213   Past Surgical History:  Procedure Laterality Date   ANTERIOR CERVICAL DECOMP/DISCECTOMY FUSION N/A 05/28/2018   CATARACT EXTRACTION W/PHACO Left 04/11/2019   Procedure: CATARACT EXTRACTION PHACO AND INTRAOCULAR LENS PLACEMENT (IOC) (CDE:5.80);  Surgeon: Baruch Goldmann, MD;  Location: [facility];  Service: Ophthalmology;  Laterality: Left;   CATARACT EXTRACTION W/PHACO Right 06/03/2019   Procedure: CATARACT EXTRACTION PHACO AND INTRAOCULAR LENS PLACEMENT RIGHT EYE;  Surgeon: Baruch Goldmann, MD;  Location: [facility];  Service: Ophthalmology;  Laterality: Right;  CDE: 9.82   COLONOSCOPY     COLONOSCOPY N/A 06/23/2014   Procedure: COLONOSCOPY;  Surgeon: Aviva Signs Md, MD;  Location: [facility];  Service: Gastroenterology;  Laterality: N/A;   EYE SURGERY Bilateral  lasic   ORTHOPEDIC SURGERY Left    heel surgery   PROSTATE BIOPSY     SHOULDER ARTHROSCOPY Right    repair of tendons and ligaments   TONSILLECTOMY       SOCIAL HISTORY:  Social History   Socioeconomic History   Marital status: Married    Spouse name: Not on file   Number of children: 2   Years of education: Not on file   Highest education level: Not on  file  Occupational History   Occupation: concrete finisher  Tobacco Use   Smoking status: Former    Types: Cigars   Smokeless tobacco: Never   Tobacco comments:    and pipe-none since 2010  Vaping Use   Vaping Use: Never used  Substance and Sexual Activity   Alcohol use: No   Drug use: No   Sexual activity: Yes    Birth control/protection: None  Other Topics Concern   Not on file  Social History Narrative   Not on file   Social Determinants of Health   Financial Resource Strain: Not on file  Food Insecurity: Not on file  Transportation Needs: Not on file  Physical Activity: Not on file  Stress: Not on file  Social Connections: Not on file  Intimate Partner Violence: Not on file    FAMILY HISTORY:  Family History  Problem Relation Age of Onset   Lymphoma Mother    Heart disease Father    Hyperlipidemia Sister    Diabetes Sister    Heart disease Sister    Breast cancer Sister    Hyperlipidemia Brother    Diabetes Brother     CURRENT MEDICATIONS:  Outpatient Encounter Medications as of 11/07/2021  Medication Sig   alfuzosin (UROXATRAL) 10 MG 24 hr tablet Take 1 tablet (10 mg total) by mouth daily with breakfast.   aspirin EC 81 MG tablet Take 81 mg by mouth daily.   celecoxib (CELEBREX) 200 MG capsule Take 200 mg by mouth daily.   EPINEPHrine 0.3 mg/0.3 mL IJ SOAJ injection Inject into the muscle as directed.   finasteride (PROSCAR) 5 MG tablet Take 1 tablet (5 mg total) by mouth daily.   FREESTYLE LITE test strip SMARTSIG:Via Meter   Glucosamine-Chondroitin (COSAMIN DS PO) Take 1 tablet by mouth daily.   hydrochlorothiazide (HYDRODIURIL) 25 MG tablet Take 0.5 tablets (12.5 mg total) by mouth daily.   irbesartan (AVAPRO) 150 MG tablet Take 1 tablet (150 mg total) by mouth daily.   Lancets (FREESTYLE) lancets 1 each 2 (two) times daily.   metFORMIN (GLUCOPHAGE) 500 MG tablet Take 500 mg by mouth 2 (two) times daily.   mirabegron ER (MYRBETRIQ) 25 MG TB24 tablet  Take 1 tablet (25 mg total) by mouth daily.   Multiple Vitamin (MULTIVITAMIN WITH MINERALS) TABS tablet Take 1 tablet by mouth daily.   omeprazole (PRILOSEC) 40 MG capsule Take 40 mg by mouth every other day.    No facility-administered encounter medications on file as of 11/07/2021.    ALLERGIES:  Allergies  Allergen Reactions   Diclofenac Anaphylaxis   Diclofenac Sodium Anaphylaxis    Topical gel   Levaquin [Levofloxacin In D5w] Anaphylaxis   Lidocaine Anaphylaxis     PHYSICAL EXAM:  ECOG PERFORMANCE STATUS: 1 - Symptomatic but completely ambulatory  There were no vitals filed for this visit. There were no vitals filed for this visit. Physical Exam Constitutional:      Appearance: Normal appearance. He is obese.  HENT:  Head: Normocephalic and atraumatic.     Mouth/Throat:     Mouth: Mucous membranes are moist.  Eyes:     Extraocular Movements: Extraocular movements intact.     Pupils: Pupils are equal, round, and reactive to light.  Cardiovascular:     Rate and Rhythm: Normal rate and regular rhythm.     Pulses: Normal pulses.     Heart sounds: Normal heart sounds.  Pulmonary:     Effort: Pulmonary effort is normal.     Breath sounds: Normal breath sounds.  Abdominal:     General: Bowel sounds are normal.     Palpations: Abdomen is soft.     Tenderness: There is no abdominal tenderness.  Musculoskeletal:        General: No swelling.     Right lower leg: No edema.     Left lower leg: No edema.  Lymphadenopathy:     Cervical: No cervical adenopathy.  Skin:    General: Skin is warm and dry.  Neurological:     General: No focal deficit present.     Mental Status: He is alert and oriented to person, place, and time.  Psychiatric:        Mood and Affect: Mood normal.        Behavior: Behavior normal.      LABORATORY DATA:  I have reviewed the labs as listed.  CBC    Component Value Date/Time   WBC 6.2 10/21/2021 1401   RBC 4.20 (L) 10/21/2021 1401    HGB 11.7 (L) 10/21/2021 1401   HGB 12.4 (L) 03/28/2021 1008   HCT 34.5 (L) 10/21/2021 1401   HCT 37.0 (L) 03/28/2021 1008   PLT 196 10/21/2021 1401   PLT 274 03/28/2021 1008   MCV 82.1 10/21/2021 1401   MCV 81 03/28/2021 1008   MCH 27.9 10/21/2021 1401   MCHC 33.9 10/21/2021 1401   RDW 12.6 10/21/2021 1401   RDW 11.6 03/28/2021 1008   LYMPHSABS 1.7 10/21/2021 1401   LYMPHSABS 1.6 03/28/2021 1008   MONOABS 0.5 10/21/2021 1401   EOSABS 0.3 10/21/2021 1401   EOSABS 0.3 03/28/2021 1008   BASOSABS 0.0 10/21/2021 1401   BASOSABS 0.0 03/28/2021 1008      Latest Ref Rng & Units 10/21/2021    2:01 PM 03/28/2021   10:09 AM 03/28/2021   10:08 AM  CMP  Glucose 70 - 99 mg/dL 229  133    BUN 8 - 23 mg/dL 19  25    Creatinine 0.61 - 1.24 mg/dL 1.88  1.91    Sodium 135 - 145 mmol/L 138  141    Potassium 3.5 - 5.1 mmol/L 4.1  4.4    Chloride 98 - 111 mmol/L 107  104    CO2 22 - 32 mmol/L 25  24    Calcium 8.9 - 10.3 mg/dL 8.9  9.8    Total Protein 6.5 - 8.1 g/dL 6.6   7.0   Total Bilirubin 0.3 - 1.2 mg/dL 0.6   0.4   Alkaline Phos 38 - 126 U/L 59   87   AST 15 - 41 U/L 19   15   ALT 0 - 44 U/L 19   12     DIAGNOSTIC IMAGING:  I have independently reviewed the relevant imaging and discussed with the patient.  ASSESSMENT & PLAN: 1.  Abnormal SPEP - Referred by Dr. Theador Hawthorne due to labs from 12/20/2020 and 04/12/2021 showing a poorly defined band of restricted protein and gammaglobulin  regions on SPEP/IFE.  Urine IFE showed poorly defined area of restricted protein mobility detected and is reactive with kappa light chain antisera. - Hematology work-up (10/21/2021): SPEP and immunofixation negative for monoclonal protein. Urine immunofixation unremarkable, no evidence of monoclonal protein. Elevated kappa light chains 42.1 with lambda light chains elevated at 35.4 and normal ratio 1.19.  This is in keeping with patient's CKD stage IIIb. Mildly elevated beta-2 microglobulin 2.6.  Normal  LDH. No evidence of CRAB features on CBC/CMP.  (Baseline anemia with Hgb 11.7, baseline CKD stage IIIb with creatinine 1.88, calcium 8.9) - No new bone pain, B symptoms, or neurologic changes - PLAN: No evidence of MGUS or myeloma at this time.  We will recheck MGUS panel and 24-hour urine/UPEP every 6 months x 1 year. - If any evidence of monoclonal protein on future labs, we will check skeletal survey and consider bone marrow biopsy if indicated. - Labs in 6 months.  Office visit after labs.   All questions were answered. The patient knows to call the clinic with any problems, questions or concerns.  Medical decision making: Low  Time spent on visit: I spent 15 minutes counseling the patient face to face. The total time spent in the appointment was 22 minutes and more than 50% was on counseling.   Harriett Rush, PA-C  11/07/2021 9:33 AM

## 2021-11-07 ENCOUNTER — Inpatient Hospital Stay: Payer: PPO | Attending: Physician Assistant | Admitting: Physician Assistant

## 2021-11-07 VITALS — BP 151/81 | HR 66 | Temp 98.6°F | Resp 18 | Ht 70.0 in | Wt 238.0 lb

## 2021-11-07 DIAGNOSIS — R0602 Shortness of breath: Secondary | ICD-10-CM | POA: Diagnosis not present

## 2021-11-07 DIAGNOSIS — Z79899 Other long term (current) drug therapy: Secondary | ICD-10-CM | POA: Diagnosis not present

## 2021-11-07 DIAGNOSIS — H538 Other visual disturbances: Secondary | ICD-10-CM | POA: Insufficient documentation

## 2021-11-07 DIAGNOSIS — Z803 Family history of malignant neoplasm of breast: Secondary | ICD-10-CM | POA: Insufficient documentation

## 2021-11-07 DIAGNOSIS — D472 Monoclonal gammopathy: Secondary | ICD-10-CM | POA: Diagnosis not present

## 2021-11-07 DIAGNOSIS — R5383 Other fatigue: Secondary | ICD-10-CM | POA: Diagnosis not present

## 2021-11-07 DIAGNOSIS — Z7984 Long term (current) use of oral hypoglycemic drugs: Secondary | ICD-10-CM | POA: Diagnosis not present

## 2021-11-07 DIAGNOSIS — H9319 Tinnitus, unspecified ear: Secondary | ICD-10-CM | POA: Diagnosis not present

## 2021-11-07 DIAGNOSIS — N400 Enlarged prostate without lower urinary tract symptoms: Secondary | ICD-10-CM | POA: Insufficient documentation

## 2021-11-07 DIAGNOSIS — E119 Type 2 diabetes mellitus without complications: Secondary | ICD-10-CM | POA: Diagnosis not present

## 2021-11-07 DIAGNOSIS — R778 Other specified abnormalities of plasma proteins: Secondary | ICD-10-CM

## 2021-11-07 DIAGNOSIS — R51 Headache with orthostatic component, not elsewhere classified: Secondary | ICD-10-CM | POA: Insufficient documentation

## 2021-11-07 DIAGNOSIS — K219 Gastro-esophageal reflux disease without esophagitis: Secondary | ICD-10-CM | POA: Diagnosis not present

## 2021-11-07 DIAGNOSIS — I10 Essential (primary) hypertension: Secondary | ICD-10-CM | POA: Diagnosis not present

## 2021-11-07 DIAGNOSIS — G8929 Other chronic pain: Secondary | ICD-10-CM | POA: Insufficient documentation

## 2021-11-07 DIAGNOSIS — Z7982 Long term (current) use of aspirin: Secondary | ICD-10-CM | POA: Diagnosis not present

## 2021-11-07 DIAGNOSIS — Z87891 Personal history of nicotine dependence: Secondary | ICD-10-CM | POA: Diagnosis not present

## 2021-11-07 NOTE — Patient Instructions (Signed)
[place] [facility] [place] **   You were seen today by Tarri Abernethy PA-C for your follow-up visit.    ABNORMAL PROTEIN You were sent to [place] by your kidney doctor to evaluate possible abnormal protein in your blood. The tests that we checked did not show any signs of this abnormal protein. We will recheck these tests again in 6 months to make sure that we still do not see any abnormal protein. This abnormal protein (monoclonal protein) is associated with a condition called MGUS, which can progress to a type of cancer called multiple myeloma.  At this time we do NOT see any sign of either MGUS or myeloma in your blood work.  LABS: Return in 6 months for repeat labs and urine studies  FOLLOW-UP APPOINTMENT: Office visit in 6 months, 1 week after labs and urine study  ** Thank you for trusting me with your healthcare!  I strive to provide all of my patients with quality care at each visit.  If you receive a survey for this visit, I would be so grateful to you for taking the time to provide feedback.  Thank you in advance!  ~ Mila Pair                   Dr. Derek Jack   &   Tarri Abernethy, PA-C   - - - - - - - - - - - - - - - - - -    Thank you for choosing [place] at [facility] to provide your oncology and hematology care.  To afford each patient quality time with our provider, please arrive at least 15 minutes before your scheduled appointment time.   If you have a lab appointment with the [facility] please come in thru the Main Entrance and check in at the main information desk.  You need to re-schedule your appointment should you arrive 10 or more minutes late.  We strive to give you quality time with our providers, and arriving late affects you and other patients whose appointments are after yours.  Also, if you no show three or more times for appointments you may be dismissed from  the clinic at the providers discretion.     Again, thank you for choosing [facility].  Our hope is that these requests will decrease the amount of time that you wait before being seen by our physicians.       _____________________________________________________________  Should you have questions after your visit to [facility], please contact our office at [phone] and follow the prompts.  Our office hours are 8:00 a.m. and 4:30 p.m. Monday - Friday.  Please note that voicemails left after 4:00 p.m. may not be returned until the following business day.  We are closed weekends and major holidays.  You do have access to a nurse 24-7, just call the main number to the clinic [phone] and do not press any options, hold on the line and a nurse will answer the phone.    For prescription refill requests, have your pharmacy contact our office and allow 72 hours.

## 2021-12-20 IMAGING — DX DG LUMBAR SPINE COMPLETE 4+V
5 series · 5 of 5 positions shown · non-contrast
Comparison: None available.

CLINICAL DATA: Pain in both lower legs. Low back pain radiating
pain and numbness down the back of both legs. Known injury.

EXAM:
LUMBAR SPINE - COMPLETE 4+ VIEW

[l-spine ap]
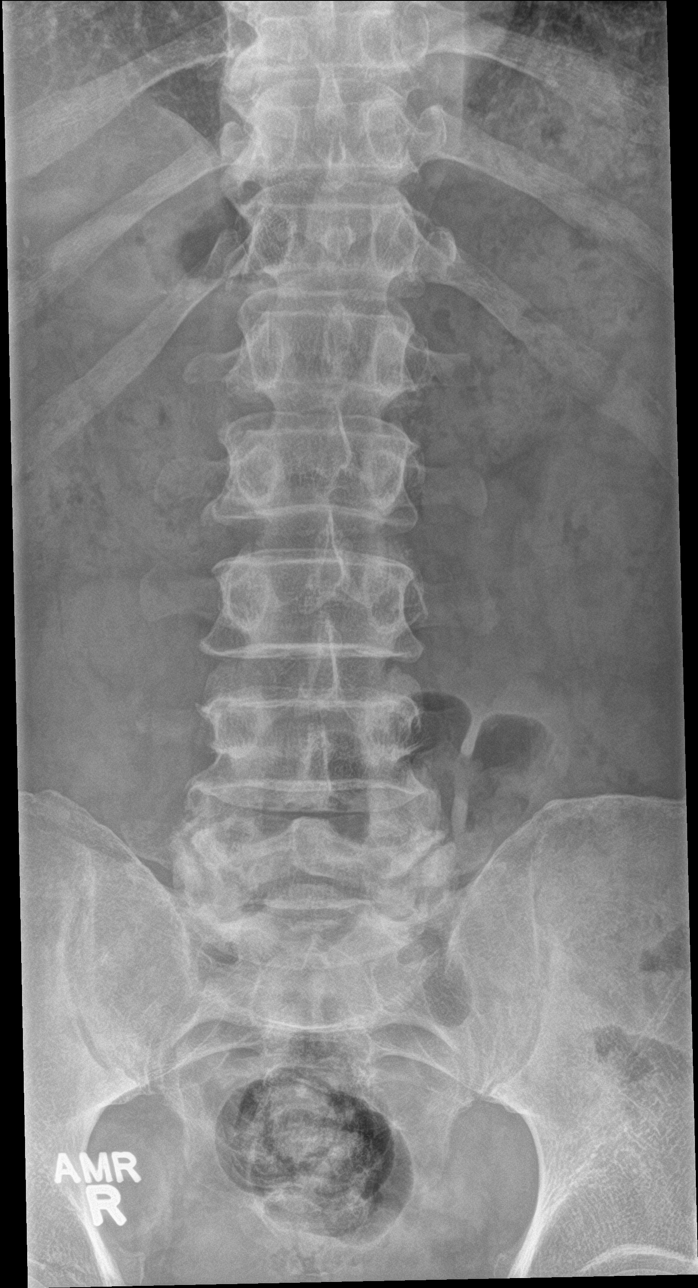

[l-spine obl (1 of 2)]
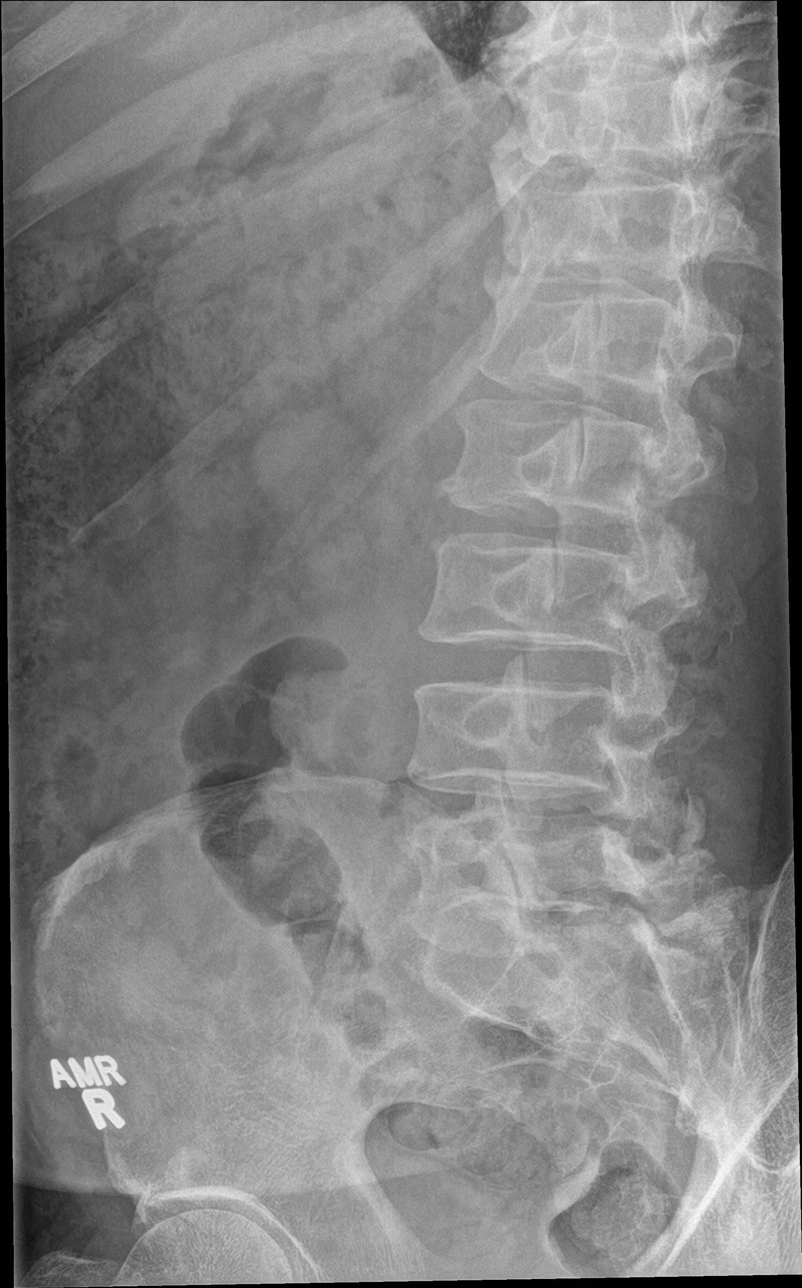

[l-spine obl (2 of 2)]
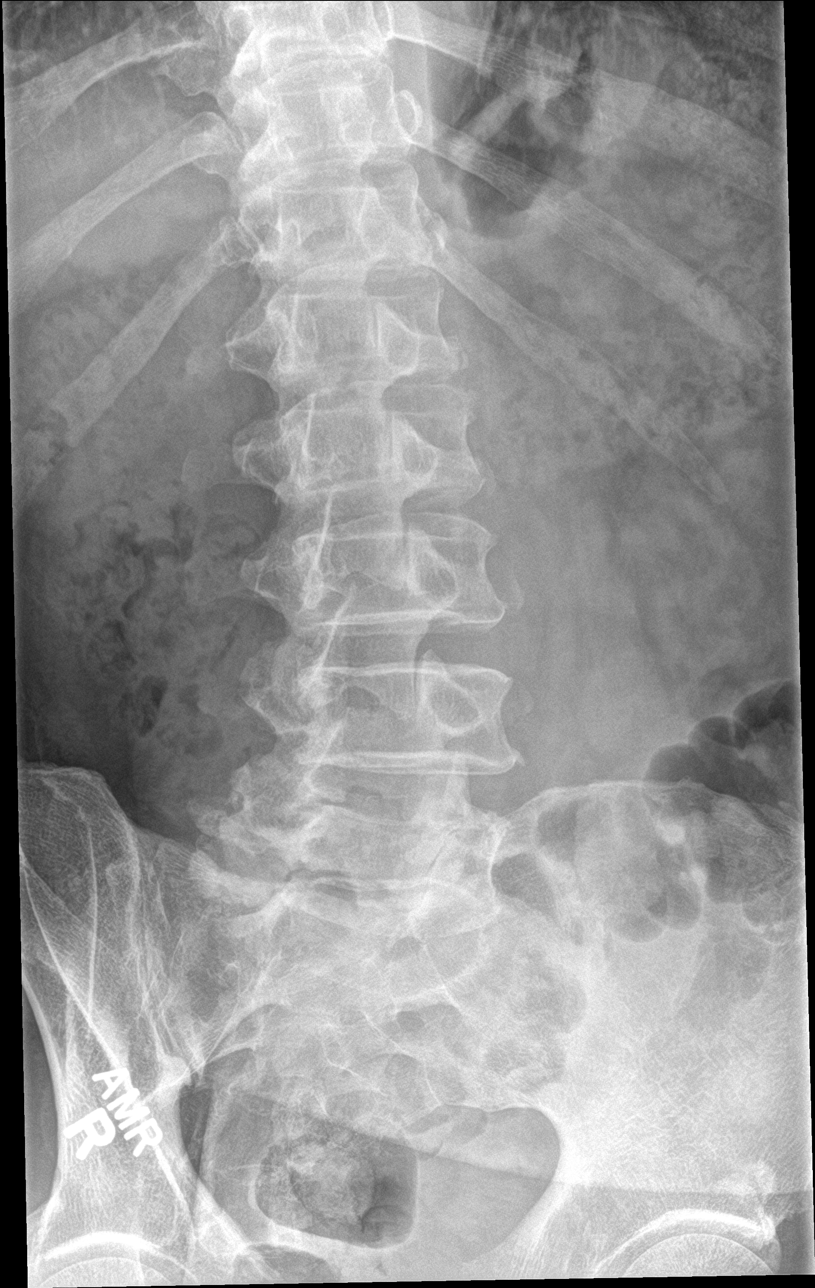

[l-spine lat]
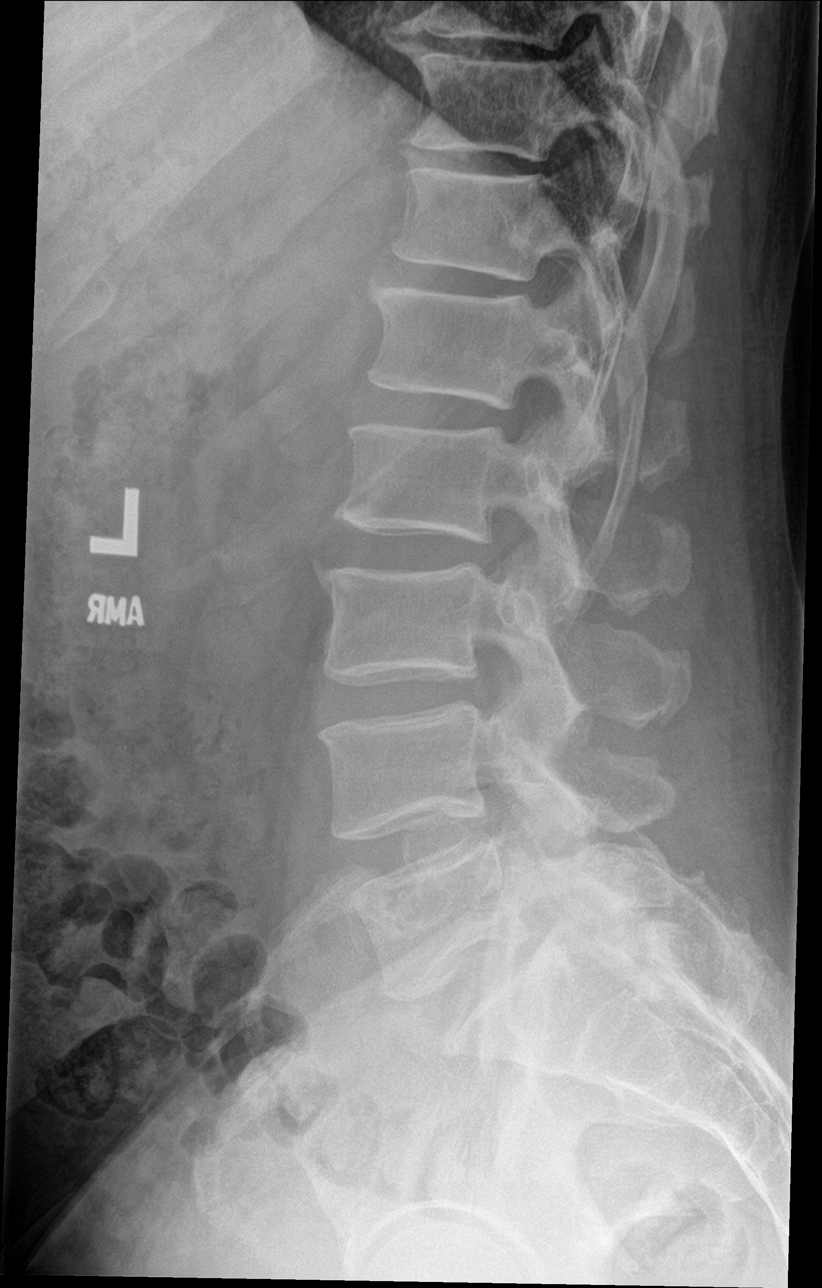

[l-spine spot]
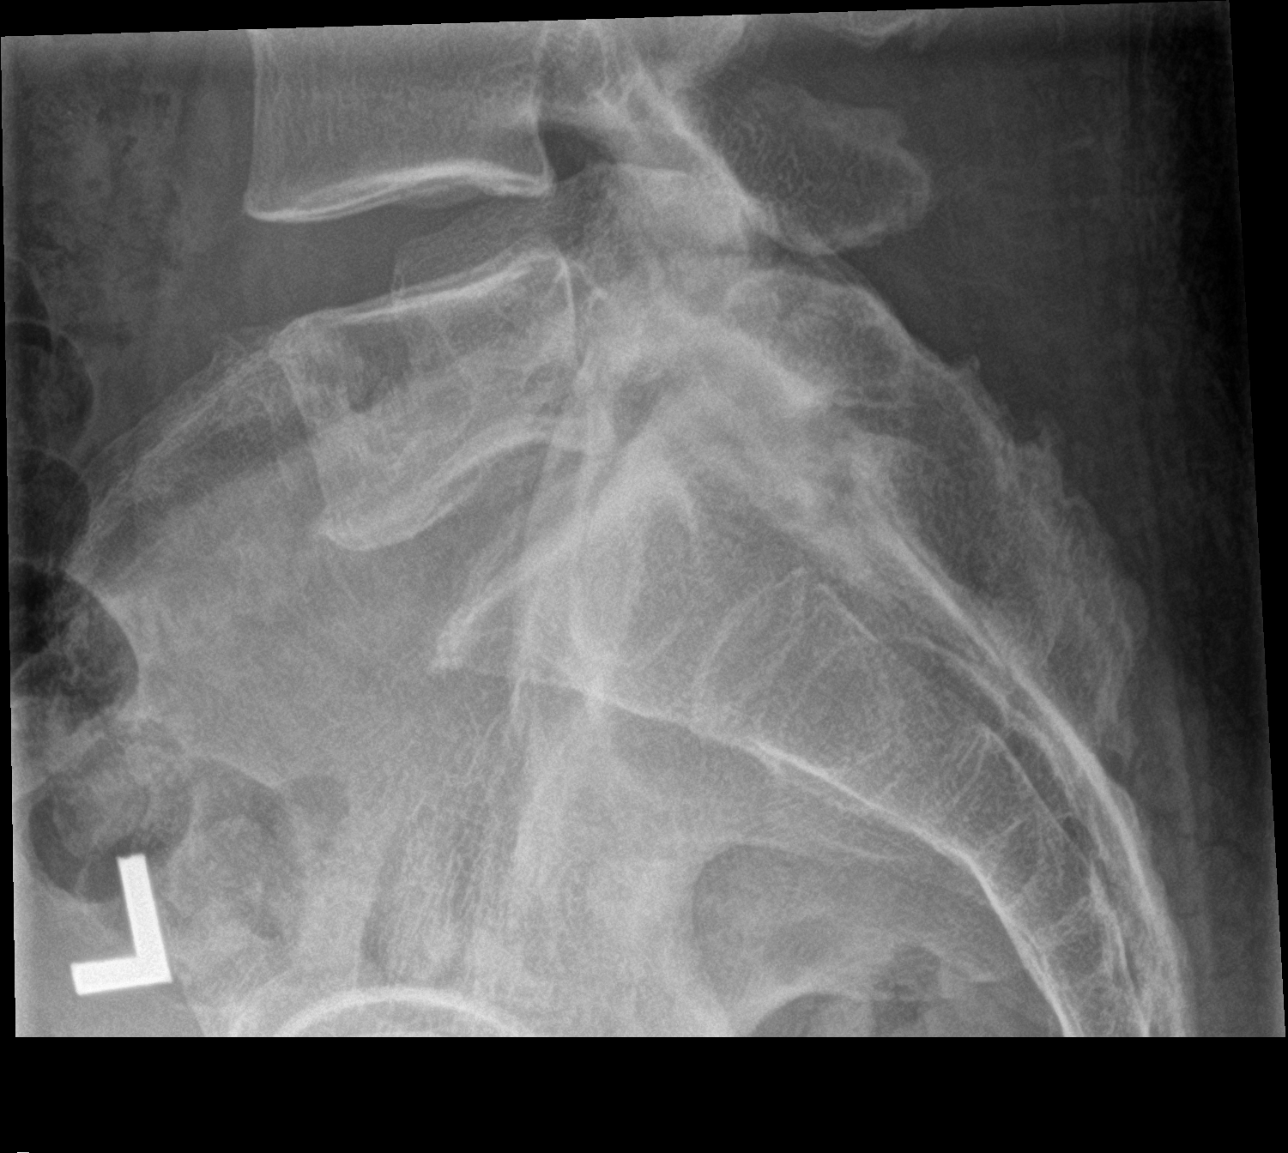

[5 of 5 positions shown; findings below may reference images not displayed]

FINDINGS: There are 5 lumbar type vertebra. Trace anterolisthesis of L5 on S1.
No definite pars defects are seen. The alignment is otherwise
normal. Intervertebral disc spaces are normal. Slight anterior
spurring at L2-L3. There is facet hypertrophy at L4-L5 and L5-S1. No
evidence of fracture, focal bone lesion, or bony destruction
unremarkable appearance of the sacroiliac joints.
IMPRESSION: 1. Facet hypertrophy in the lower lumbar spine with trace
anterolisthesis of L5 on S1. No definite pars interarticularis
defects.
2. Minimal anterior spurring at L2-L3.

## 2022-02-10 ENCOUNTER — Encounter: Payer: Self-pay | Admitting: Internal Medicine

## 2022-02-10 ENCOUNTER — Ambulatory Visit: Payer: PPO | Attending: Internal Medicine | Admitting: Internal Medicine

## 2022-02-10 ENCOUNTER — Ambulatory Visit: Payer: PPO | Admitting: Internal Medicine

## 2022-02-10 VITALS — BP 130/70 | HR 62 | Ht 70.0 in | Wt 242.0 lb

## 2022-02-10 DIAGNOSIS — I10 Essential (primary) hypertension: Secondary | ICD-10-CM

## 2022-02-10 DIAGNOSIS — G4733 Obstructive sleep apnea (adult) (pediatric): Secondary | ICD-10-CM

## 2022-02-10 NOTE — Patient Instructions (Signed)
Medication Instructions:  Your physician recommends that you continue on your current medications as directed. Please refer to the Current Medication list given to you today.  *If you need a refill on your cardiac medications before your next appointment, please call your pharmacy*   Lab Work: NONE   If you have labs (blood work) drawn today and your tests are completely normal, you will receive your results only by: [place] (if you have MyChart) OR A paper copy in the mail If you have any lab test that is abnormal or we need to change your treatment, we will call you to review the results.   Testing/Procedures: NONE    Follow-Up: At [facility], you and your health needs are our priority.  As part of our continuing mission to provide you with exceptional heart care, we have created designated Provider Care Teams.  These Care Teams include your primary Cardiologist (physician) and Advanced Practice Providers (APPs -  Physician Assistants and Nurse Practitioners) who all work together to provide you with the care you need, when you need it.  We recommend signing up for the patient portal called "MyChart".  Sign up information is provided on this After Visit Summary.  MyChart is used to connect with patients for Virtual Visits (Telemedicine).  Patients are able to view lab/test results, encounter notes, upcoming appointments, etc.  Non-urgent messages can be sent to your provider as well.   To learn more about what you can do with MyChart, go to [email or web address].    Your next appointment:   1 year(s)  The format for your next appointment:   In Person  Provider:   Claudina Lick, MD    Other Instructions Thank you for choosing [facility]!    Important Information About [place]

## 2022-02-10 NOTE — Progress Notes (Signed)
Cardiology Office Note  Date: 02/10/2022   ID: [name], Thomas Malone 1950/02/11, MRN [id number]  PCP:  Lemmie Evens, MD  Cardiologist:  Freada Bergeron, MD Electrophysiologist:  None   Reason for Office Visit: Follow-up visit of SOB   History of Present Illness: Thomas Malone is a 72 y.o. male known to have HTN, DM2, remote history of pulmonary sarcoidosis (follows Dr. Melvyn Malone), OSA on CPAP presented to cardiology clinic for follow-up visit.  Patient was initially referred to cardiology clinic in 04/2021 for DOE. Notably, he had been sedentary for about 4 years after work injury started to have significant SOB when he started trying to exert himself again. TTE in 05/2021 showed LVEF 60 to 65%, G1 DD, no significant valve disease. NM SPECT 04/2021 was low risk with normal EF and likely inferior artifact. He presented today to the cardiology clinic for follow-up visit. He is currently off HCTZ and irbesartan. His PCP started amlodipine 5 mg and the dose was increased to 10 mg once daily recently. Blood pressure is controlled. He denies any symptoms of SOB/DOE, angina, dizziness, lightheadedness, syncope, LE swelling, palpitations. Denies claudication. He is compliant with CPAP therapy. Denies smoking cigarettes, alcohol use and illicit drug abuse.   Past Medical History:  Diagnosis Date   Abnormal CT of the chest 01/23/14   BPH (benign prostatic hyperplasia) 7/82/9562   Complication of anesthesia    hard to wake up    Diabetes mellitus without complication (HCC)    Elevated PSA    Erectile dysfunction    Gastroesophageal reflux    H/O sarcoidosis    Headache    7-8 months from gabapentin and naproxen   Hypertension    Pneumonia of upper lobe of lung 12/1213    Past Surgical History:  Procedure Laterality Date   ANTERIOR CERVICAL DECOMP/DISCECTOMY FUSION N/A 05/28/2018   CATARACT EXTRACTION W/PHACO Left 04/11/2019   Procedure: CATARACT EXTRACTION PHACO AND INTRAOCULAR LENS  PLACEMENT (IOC) (CDE:5.80);  Surgeon: Thomas Goldmann, MD;  Location: [facility];  Service: Ophthalmology;  Laterality: Left;   CATARACT EXTRACTION W/PHACO Right 06/03/2019   Procedure: CATARACT EXTRACTION PHACO AND INTRAOCULAR LENS PLACEMENT RIGHT EYE;  Surgeon: Thomas Goldmann, MD;  Location: [facility];  Service: Ophthalmology;  Laterality: Right;  CDE: 9.82   COLONOSCOPY     COLONOSCOPY N/A 06/23/2014   Procedure: COLONOSCOPY;  Surgeon: Aviva Signs Md, MD;  Location: [facility];  Service: Gastroenterology;  Laterality: N/A;   EYE SURGERY Bilateral    lasic   ORTHOPEDIC SURGERY Left    heel surgery   PROSTATE BIOPSY     SHOULDER ARTHROSCOPY Right    repair of tendons and ligaments   TONSILLECTOMY      Current Outpatient Medications  Medication Sig Dispense Refill   alfuzosin (UROXATRAL) 10 MG 24 hr tablet Take 1 tablet (10 mg total) by mouth daily with breakfast. 90 tablet 3   aspirin EC 81 MG tablet Take 81 mg by mouth daily.     EPINEPHrine 0.3 mg/0.3 mL IJ SOAJ injection Inject into the muscle as directed.     finasteride (PROSCAR) 5 MG tablet Take 1 tablet (5 mg total) by mouth daily. 90 tablet 3   FREESTYLE LITE test strip SMARTSIG:Via Meter     Glucosamine-Chondroitin (COSAMIN DS PO) Take 1 tablet by mouth daily.     irbesartan (AVAPRO) 150 MG tablet Take 1 tablet (150 mg total) by mouth daily. 30 tablet 11   Lancets (FREESTYLE) lancets 1 each  2 (two) times daily.     Multiple Vitamin (MULTIVITAMIN WITH MINERALS) TABS tablet Take 1 tablet by mouth daily.     omeprazole (PRILOSEC) 40 MG capsule Take 40 mg by mouth every other day.      pravastatin (PRAVACHOL) 10 MG tablet Take 10 mg by mouth at bedtime.     tadalafil (CIALIS) 5 MG tablet Take 5 mg by mouth daily.     celecoxib (CELEBREX) 200 MG capsule Take 200 mg by mouth daily. (Patient not taking: Reported on 02/10/2022)     hydrochlorothiazide (HYDRODIURIL) 25 MG tablet Take 0.5 tablets (12.5 mg total) by mouth daily. (Patient not  taking: Reported on 02/10/2022) 45 tablet 3   metFORMIN (GLUCOPHAGE) 500 MG tablet Take 500 mg by mouth 2 (two) times daily. (Patient not taking: Reported on 02/10/2022)     mirabegron ER (MYRBETRIQ) 25 MG TB24 tablet Take 1 tablet (25 mg total) by mouth daily. (Patient not taking: Reported on 02/10/2022) 30 tablet 0   No current facility-administered medications for this visit.   Allergies:  Diclofenac, Diclofenac sodium, Levaquin [levofloxacin in d5w], and Lidocaine   Social History: The patient  reports that he has quit smoking. His smoking use included cigars. He has never used smokeless tobacco. He reports that he does not drink alcohol and does not use drugs.   Family History: The patient's family history includes Breast cancer in his sister; Diabetes in his brother and sister; Heart disease in his father and sister; Hyperlipidemia in his brother and sister; Lymphoma in his mother.   ROS:  Please see the history of present illness. Otherwise, complete review of systems is positive for none.  All other systems are reviewed and negative.   Physical Exam: VS:  Ht '5\' 10"'$  (1.778 m)   Wt 242 lb (109.8 kg)   BMI 34.72 kg/m , BMI Body mass index is 34.72 kg/m.  Wt Readings from Last 3 Encounters:  02/10/22 242 lb (109.8 kg)  11/07/21 238 lb (108 kg)  10/21/21 240 lb 9.6 oz (109.1 kg)    General: Patient appears comfortable at rest. HEENT: Conjunctiva and lids normal, oropharynx clear with moist mucosa. Neck: Supple, no elevated JVP or carotid bruits, no thyromegaly. Lungs: Clear to auscultation, nonlabored breathing at rest. Cardiac: Regular rate and rhythm, no S3 or significant systolic murmur, no pericardial rub. Abdomen: Soft, nontender, no hepatomegaly, bowel sounds present, no guarding or rebound. Extremities: No pitting edema, distal pulses 2+. Skin: Warm and dry. Musculoskeletal: No kyphosis. Neuropsychiatric: Alert and oriented x3, affect grossly appropriate.  ECG: Normal sinus  rhythm with no ST-T changes  Recent Labwork: 03/28/2021: BNP 14.8 10/21/2021: ALT 19; AST 19; BUN 19; Creatinine, Ser 1.88; Hemoglobin 11.7; Platelets 196; Potassium 4.1; Sodium 138  No results found for: "CHOL", "TRIG", "HDL", "CHOLHDL", "VLDL", "LDLCALC", "LDLDIRECT"  Other Studies Reviewed Today: TTE in 05/2021 showed LVEF 60 to 65%, G1 DD, no significant valve disease. NM SPECT 04/2021 was low risk with normal EF and likely inferior artifact.   Assessment and Plan: Patient is a 72 year old M known to have HTN, DM 2, HLD, remote history of neurosarcoidosis followed by Dr. Melvyn Malone, OSA on CPAP, presented to cardiology clinic for SOB.  # SOB/DOE, resolved - TTE in 05/2021 showed LVEF 60 to 65%, G1 DD, no significant valve disease. NM SPECT 04/2021 was low risk with normal EF and likely inferior artifact. SOB/DOE improved and patient currently denies any of the symptoms. This might be related to his deconditioning after 4  years secondary lifestyle.  # HTN, controlled -Off HCTZ and ARB statin -PCP started him on amlodipine 5 mg once daily which was increased to 10 mg once daily with adequate control of blood pressures. -Management of HTN per PCP  # HLD -Continue pravastatin 10 mg nightly -Management of HLD per PCP  # OSA on CPAP -Encouraged compliance with CPAP therapy  # Remote history of pulmonary sarcoidosis -Follow-up with Dr. Melvyn Malone  I have spent a total of 30 minutes with patient reviewing chart, EKGs, labs and examining patient as well as establishing an assessment and plan that was discussed with the patient.  > 50% of time was spent in direct patient care.     Medication Adjustments/Labs and Tests Ordered: Current medicines are reviewed at length with the patient today.  Concerns regarding medicines are outlined above.   Tests Ordered: No orders of the defined types were placed in this encounter.   Medication Changes: No orders of the defined types were placed in this  encounter.   Disposition:  Follow up  1 year  Signed, Zaire Levesque Fidel Levy, MD, 02/10/2022 8:23 AM    [facility] [address] [place], [place] [address]

## 2022-03-10 ENCOUNTER — Encounter: Payer: Self-pay | Admitting: Orthopedic Surgery

## 2022-04-03 ENCOUNTER — Encounter: Payer: Self-pay | Admitting: Radiology

## 2022-04-04 ENCOUNTER — Other Ambulatory Visit: Payer: PPO

## 2022-04-04 DIAGNOSIS — R972 Elevated prostate specific antigen [PSA]: Secondary | ICD-10-CM

## 2022-04-05 LAB — PSA, TOTAL AND FREE
PSA, Free Pct: 16.7 %
PSA, Free: 1.74 ng/mL
Prostate Specific Ag, Serum: 10.4 ng/mL — ABNORMAL HIGH (ref 0.0–4.0)

## 2022-04-11 ENCOUNTER — Ambulatory Visit (INDEPENDENT_AMBULATORY_CARE_PROVIDER_SITE_OTHER): Payer: PPO | Admitting: Urology

## 2022-04-11 ENCOUNTER — Encounter: Payer: Self-pay | Admitting: Urology

## 2022-04-11 VITALS — BP 134/80 | HR 65

## 2022-04-11 DIAGNOSIS — R972 Elevated prostate specific antigen [PSA]: Secondary | ICD-10-CM | POA: Diagnosis not present

## 2022-04-11 DIAGNOSIS — R351 Nocturia: Secondary | ICD-10-CM | POA: Diagnosis not present

## 2022-04-11 DIAGNOSIS — N138 Other obstructive and reflux uropathy: Secondary | ICD-10-CM | POA: Diagnosis not present

## 2022-04-11 DIAGNOSIS — N401 Enlarged prostate with lower urinary tract symptoms: Secondary | ICD-10-CM | POA: Diagnosis not present

## 2022-04-11 LAB — URINALYSIS, ROUTINE W REFLEX MICROSCOPIC
Bilirubin, UA: NEGATIVE
Glucose, UA: NEGATIVE
Ketones, UA: NEGATIVE
Leukocytes,UA: NEGATIVE
Nitrite, UA: NEGATIVE
Protein,UA: NEGATIVE
RBC, UA: NEGATIVE
Specific Gravity, UA: 1.015 (ref 1.005–1.030)
Urobilinogen, Ur: 0.2 mg/dL (ref 0.2–1.0)
pH, UA: 7 (ref 5.0–7.5)

## 2022-04-11 MED ORDER — TADALAFIL 5 MG PO TABS: 5.0000 mg | ORAL_TABLET | Freq: Every day | ORAL | 3 refills | Status: DC

## 2022-04-11 MED ORDER — FINASTERIDE 5 MG PO TABS: 5.0000 mg | ORAL_TABLET | Freq: Every day | ORAL | 3 refills | Status: DC

## 2022-04-11 MED ORDER — ALFUZOSIN HCL ER 10 MG PO TB24: 10.0000 mg | ORAL_TABLET | Freq: Every day | ORAL | 3 refills | Status: DC

## 2022-04-11 NOTE — Patient Instructions (Signed)

## 2022-04-11 NOTE — Progress Notes (Signed)
04/11/2022 10:42 AM   Denese Killings [place] [birth year] [id number]  Referring provider: Lemmie Evens, MD [place],  [place] [address]  Followup elevated PSA and BPH   HPI: Mr Destin is a 72yo here for followup for elevated PSA. PSA increased 10.4 from 9.2 IPSS 9 QOL 1 on uroxatral '10mg'$  qhs and finasteride '5mg'$  daily. [place]irne stream strong. Nocturia 1-2x. No strainign to urinate   PMH: Past Medical History:  Diagnosis Date   Abnormal CT of the chest 01/23/14   BPH (benign prostatic hyperplasia) [id number]   Complication of anesthesia    hard to wake up    Diabetes mellitus without complication (HCC)    Elevated PSA    Erectile dysfunction    Gastroesophageal reflux    H/O sarcoidosis    Headache    7-8 months from gabapentin and naproxen   Hypertension    Pneumonia of upper lobe of lung 12/1213    Surgical History: Past Surgical History:  Procedure Laterality Date   ANTERIOR CERVICAL DECOMP/DISCECTOMY FUSION N/A 05/28/2018   CATARACT EXTRACTION W/PHACO Left 04/11/2019   Procedure: CATARACT EXTRACTION PHACO AND INTRAOCULAR LENS PLACEMENT (IOC) (CDE:5.80);  Surgeon: Baruch Goldmann, MD;  Location: [facility];  Service: Ophthalmology;  Laterality: Left;   CATARACT EXTRACTION W/PHACO Right 06/03/2019   Procedure: CATARACT EXTRACTION PHACO AND INTRAOCULAR LENS PLACEMENT RIGHT EYE;  Surgeon: Baruch Goldmann, MD;  Location: [facility];  Service: Ophthalmology;  Laterality: Right;  CDE: 9.82   COLONOSCOPY     COLONOSCOPY N/A 06/23/2014   Procedure: COLONOSCOPY;  Surgeon: Aviva Signs Md, MD;  [facility];  Service: Gastroenterology;  Laterality: N/A;   EYE SURGERY Bilateral    lasic   ORTHOPEDIC SURGERY Left    heel surgery   PROSTATE BIOPSY     SHOULDER ARTHROSCOPY Right    repair of tendons and ligaments   TONSILLECTOMY      Home Medications:  Allergies as of 04/11/2022       Reactions   Diclofenac Anaphylaxis   Diclofenac Sodium Anaphylaxis   Topical gel    Levaquin [levofloxacin In D5w] Anaphylaxis   Lidocaine Anaphylaxis        Medication List        Accurate as of April 11, 2022 10:42 AM. If you have any questions, ask your nurse or doctor.          alfuzosin 10 MG 24 hr tablet Commonly known as: UROXATRAL Take 1 tablet (10 mg total) by mouth daily with breakfast.   aspirin EC 81 MG tablet Take 81 mg by mouth daily.   celecoxib 200 MG capsule Commonly known as: CELEBREX Take 200 mg by mouth daily.   COSAMIN DS PO Take 1 tablet by mouth daily.   EPINEPHrine 0.3 mg/0.3 mL Soaj injection Commonly known as: EPI-PEN Inject into the muscle as directed.   finasteride 5 MG tablet Commonly known as: PROSCAR Take 1 tablet (5 mg total) by mouth daily.   freestyle lancets 1 each 2 (two) times daily.   FREESTYLE LITE test strip Generic drug: glucose blood SMARTSIG:Via Meter   hydrochlorothiazide 25 MG tablet Commonly known as: HYDRODIURIL Take 0.5 tablets (12.5 mg total) by mouth daily.   irbesartan 150 MG tablet Commonly known as: Avapro Take 1 tablet (150 mg total) by mouth daily.   metFORMIN 500 MG tablet Commonly known as: GLUCOPHAGE Take 500 mg by mouth 2 (two) times daily.   mirabegron ER 25 MG Tb24 tablet Commonly known as: MYRBETRIQ  Take 1 tablet (25 mg total) by mouth daily.   multivitamin with minerals Tabs tablet Take 1 tablet by mouth daily.   omeprazole 40 MG capsule Commonly known as: PRILOSEC Take 40 mg by mouth every other day.   pravastatin 10 MG tablet Commonly known as: PRAVACHOL Take 10 mg by mouth at bedtime.   tadalafil 5 MG tablet Commonly known as: CIALIS Take 5 mg by mouth daily.        Allergies:  Allergies  Allergen Reactions   Diclofenac Anaphylaxis   Diclofenac Sodium Anaphylaxis    Topical gel   Levaquin [Levofloxacin In D5w] Anaphylaxis   Lidocaine Anaphylaxis    Family History: Family History  Problem Relation Age of Onset   Lymphoma Mother    Heart  disease Father    Hyperlipidemia Sister    Diabetes Sister    Heart disease Sister    Breast cancer Sister    Hyperlipidemia Brother    Diabetes Brother     Social History:  reports that he has quit smoking. His smoking use included cigars. He has never used smokeless tobacco. He reports that he does not drink alcohol and does not use drugs.  ROS: All other review of systems were reviewed and are negative except what is noted above in HPI  Physical Exam: BP 134/80   Pulse 65   Constitutional:  Alert and oriented, No acute distress. HEENT: [place] AT, moist mucus membranes.  Trachea midline, no masses. Cardiovascular: No clubbing, cyanosis, or edema. Respiratory: Normal respiratory effort, no increased work of breathing. GI: Abdomen is soft, nontender, nondistended, no abdominal masses GU: No CVA tenderness.  Lymph: No cervical or inguinal lymphadenopathy. Skin: No rashes, bruises or suspicious lesions. Neurologic: Grossly intact, no focal deficits, moving all 4 extremities. Psychiatric: Normal mood and affect.  Laboratory Data: Lab Results  Component Value Date   WBC 6.2 10/21/2021   HGB 11.7 (L) 10/21/2021   HCT 34.5 (L) 10/21/2021   MCV 82.1 10/21/2021   PLT 196 10/21/2021    Lab Results  Component Value Date   CREATININE 1.88 (H) 10/21/2021    Lab Results  Component Value Date   PSA 6.7 (H) 06/22/2019    No results found for: "TESTOSTERONE"  Lab Results  Component Value Date   HGBA1C 6.8 (H) 04/07/2019    Urinalysis    Component Value Date/Time   COLORURINE STRAW (A) 11/15/2016 0617   APPEARANCEUR Clear 10/11/2021 1032   LABSPEC 1.009 11/15/2016 0617   PHURINE 7.0 11/15/2016 0617   GLUCOSEU Negative 10/11/2021 1032   HGBUR NEGATIVE 11/15/2016 0617   BILIRUBINUR Negative 10/11/2021 1032   KETONESUR NEGATIVE 11/15/2016 0617   PROTEINUR Negative 10/11/2021 1032   PROTEINUR NEGATIVE 11/15/2016 0617   UROBILINOGEN 0.2 06/22/2019 1104   NITRITE Negative  10/11/2021 1032   NITRITE NEGATIVE 11/15/2016 0617   LEUKOCYTESUR Negative 10/11/2021 1032    Lab Results  Component Value Date   LABMICR Comment 10/11/2021    Pertinent Imaging:  No results found for this or any previous visit.  No results found for this or any previous visit.  No results found for this or any previous visit.  No results found for this or any previous visit.  Results for orders placed during the hospital encounter of 12/24/20  US RENAL  Narrative CLINICAL DATA:  Chronic kidney disease.  EXAM: RENAL / URINARY TRACT ULTRASOUND COMPLETE  COMPARISON:  None.  FINDINGS: Right Kidney:  Renal measurements: 11.5 x 6.1 x 5.2 cm =  volume: 188 mL. There is diffuse increased renal parenchymal echogenicity. No hydronephrosis or shadowing stone. There is a 5 cm upper pole cyst.  Left Kidney:  Renal measurements: 11.9 x 6.6 x 5.4 cm = volume: 222 mL. Normal echogenicity. No hydronephrosis or shadowing stone.  Bladder:  Appears normal for degree of bladder distention.  Other:  None.  IMPRESSION: 1. Echogenic right kidney a 5 cm upper pole cyst. No hydronephrosis or shadowing stone. 2. Unremarkable left kidney and urinary bladder.   Electronically Signed By: Anner Crete M.D. On: 12/24/2020 23:22  No valid procedures specified. No results found for this or any previous visit.  No results found for this or any previous visit.   Assessment & Plan:    1. Elevated PSA -Followup 6 months with PSA - Urinalysis, Routine w reflex microscopic  2. Benign prostatic hyperplasia with urinary obstruction -continue uroxatral and finasteride - Urinalysis, Routine w reflex microscopic  3. Nocturia -continue uroxatral '10mg'$    No follow-ups on file.  Nicolette Bang, MD  [facility] [place]

## 2022-04-15 ENCOUNTER — Other Ambulatory Visit: Payer: Self-pay | Admitting: Internal Medicine

## 2022-04-15 DIAGNOSIS — I10 Essential (primary) hypertension: Secondary | ICD-10-CM

## 2022-04-15 NOTE — Telephone Encounter (Signed)
Please advise on refill request

## 2022-04-15 NOTE — Telephone Encounter (Signed)
Needs to see PCP for further refills so only give enough to get the pt to the next ov with PCP and he can take over the monitoring

## 2022-05-09 ENCOUNTER — Inpatient Hospital Stay: Payer: PPO | Attending: Hematology

## 2022-05-09 DIAGNOSIS — R779 Abnormality of plasma protein, unspecified: Secondary | ICD-10-CM | POA: Diagnosis present

## 2022-05-09 DIAGNOSIS — D631 Anemia in chronic kidney disease: Secondary | ICD-10-CM | POA: Diagnosis not present

## 2022-05-09 DIAGNOSIS — G8929 Other chronic pain: Secondary | ICD-10-CM | POA: Diagnosis not present

## 2022-05-09 DIAGNOSIS — R5383 Other fatigue: Secondary | ICD-10-CM | POA: Diagnosis not present

## 2022-05-09 DIAGNOSIS — E1122 Type 2 diabetes mellitus with diabetic chronic kidney disease: Secondary | ICD-10-CM | POA: Insufficient documentation

## 2022-05-09 DIAGNOSIS — R3915 Urgency of urination: Secondary | ICD-10-CM | POA: Insufficient documentation

## 2022-05-09 DIAGNOSIS — Z87891 Personal history of nicotine dependence: Secondary | ICD-10-CM | POA: Diagnosis not present

## 2022-05-09 DIAGNOSIS — N1832 Chronic kidney disease, stage 3b: Secondary | ICD-10-CM | POA: Insufficient documentation

## 2022-05-09 DIAGNOSIS — H538 Other visual disturbances: Secondary | ICD-10-CM | POA: Insufficient documentation

## 2022-05-09 DIAGNOSIS — R519 Headache, unspecified: Secondary | ICD-10-CM | POA: Insufficient documentation

## 2022-05-09 DIAGNOSIS — K219 Gastro-esophageal reflux disease without esophagitis: Secondary | ICD-10-CM | POA: Insufficient documentation

## 2022-05-09 DIAGNOSIS — M542 Cervicalgia: Secondary | ICD-10-CM | POA: Insufficient documentation

## 2022-05-09 DIAGNOSIS — R350 Frequency of micturition: Secondary | ICD-10-CM | POA: Insufficient documentation

## 2022-05-09 DIAGNOSIS — N401 Enlarged prostate with lower urinary tract symptoms: Secondary | ICD-10-CM | POA: Diagnosis not present

## 2022-05-09 DIAGNOSIS — Z7984 Long term (current) use of oral hypoglycemic drugs: Secondary | ICD-10-CM | POA: Insufficient documentation

## 2022-05-09 DIAGNOSIS — Z7982 Long term (current) use of aspirin: Secondary | ICD-10-CM | POA: Diagnosis not present

## 2022-05-09 DIAGNOSIS — I129 Hypertensive chronic kidney disease with stage 1 through stage 4 chronic kidney disease, or unspecified chronic kidney disease: Secondary | ICD-10-CM | POA: Insufficient documentation

## 2022-05-09 DIAGNOSIS — R778 Other specified abnormalities of plasma proteins: Secondary | ICD-10-CM

## 2022-05-09 LAB — CBC WITH DIFFERENTIAL/PLATELET
Abs Immature Granulocytes: 0.02 10*3/uL (ref 0.00–0.07)
Basophils Absolute: 0 10*3/uL (ref 0.0–0.1)
Basophils Relative: 0 %
Eosinophils Absolute: 0.5 10*3/uL (ref 0.0–0.5)
Eosinophils Relative: 7 %
HCT: 34.9 % — ABNORMAL LOW (ref 39.0–52.0)
Hemoglobin: 11.7 g/dL — ABNORMAL LOW (ref 13.0–17.0)
Immature Granulocytes: 0 %
Lymphocytes Relative: 25 %
Lymphs Abs: 1.7 10*3/uL (ref 0.7–4.0)
MCH: 27.8 pg (ref 26.0–34.0)
MCHC: 33.5 g/dL (ref 30.0–36.0)
MCV: 82.9 fL (ref 80.0–100.0)
Monocytes Absolute: 0.7 10*3/uL (ref 0.1–1.0)
Monocytes Relative: 10 %
Neutro Abs: 3.9 10*3/uL (ref 1.7–7.7)
Neutrophils Relative %: 58 %
Platelets: 236 10*3/uL (ref 150–400)
RBC: 4.21 MIL/uL — ABNORMAL LOW (ref 4.22–5.81)
RDW: 12.4 % (ref 11.5–15.5)
WBC: 6.9 10*3/uL (ref 4.0–10.5)
nRBC: 0 % (ref 0.0–0.2)

## 2022-05-09 LAB — COMPREHENSIVE METABOLIC PANEL
ALT: 19 U/L (ref 0–44)
AST: 24 U/L (ref 15–41)
Albumin: 3.8 g/dL (ref 3.5–5.0)
Alkaline Phosphatase: 83 U/L (ref 38–126)
Anion gap: 7 (ref 5–15)
BUN: 30 mg/dL — ABNORMAL HIGH (ref 8–23)
CO2: 27 mmol/L (ref 22–32)
Calcium: 9.1 mg/dL (ref 8.9–10.3)
Chloride: 106 mmol/L (ref 98–111)
Creatinine, Ser: 2.41 mg/dL — ABNORMAL HIGH (ref 0.61–1.24)
GFR, Estimated: 28 mL/min — ABNORMAL LOW (ref >60)
Glucose, Bld: 127 mg/dL — ABNORMAL HIGH (ref 70–99)
Potassium: 3.7 mmol/L (ref 3.5–5.1)
Sodium: 140 mmol/L (ref 135–145)
Total Bilirubin: 0.8 mg/dL (ref 0.3–1.2)
Total Protein: 7.2 g/dL (ref 6.5–8.1)

## 2022-05-09 LAB — LACTATE DEHYDROGENASE: LDH: 139 U/L (ref 98–192)

## 2022-05-12 LAB — KAPPA/LAMBDA LIGHT CHAINS
Kappa free light chain: 58.4 mg/L — ABNORMAL HIGH (ref 3.3–19.4)
Kappa, lambda light chain ratio: 1.26 (ref 0.26–1.65)
Lambda free light chains: 46.3 mg/L — ABNORMAL HIGH (ref 5.7–26.3)

## 2022-05-13 ENCOUNTER — Other Ambulatory Visit: Payer: Self-pay | Admitting: Internal Medicine

## 2022-05-13 DIAGNOSIS — I10 Essential (primary) hypertension: Secondary | ICD-10-CM

## 2022-05-13 LAB — PROTEIN ELECTROPHORESIS, SERUM
A/G Ratio: 1.3 (ref 0.7–1.7)
Albumin ELP: 3.8 g/dL (ref 2.9–4.4)
Alpha-1-Globulin: 0.2 g/dL (ref 0.0–0.4)
Alpha-2-Globulin: 0.5 g/dL (ref 0.4–1.0)
Beta Globulin: 0.9 g/dL (ref 0.7–1.3)
Gamma Globulin: 1.3 g/dL (ref 0.4–1.8)
Globulin, Total: 3 g/dL (ref 2.2–3.9)
Total Protein ELP: 6.8 g/dL (ref 6.0–8.5)

## 2022-05-13 LAB — UPEP/UIFE/LIGHT CHAINS/TP, 24-HR UR
% BETA, Urine: 27.8 %
ALPHA 1 URINE: 7.7 %
Albumin, U: 19.9 %
Alpha 2, Urine: 18.2 %
Free Kappa Lt Chains,Ur: 101.24 mg/L — ABNORMAL HIGH (ref 1.17–86.46)
Free Kappa/Lambda Ratio: 5.12 (ref 1.83–14.26)
Free Lambda Lt Chains,Ur: 19.78 mg/L — ABNORMAL HIGH (ref 0.27–15.21)
GAMMA GLOBULIN URINE: 26.4 %
Total Protein, Urine-Ur/day: 143 mg/24 hr (ref 30–150)
Total Protein, Urine: 5.5 mg/dL
Total Volume: 2600

## 2022-05-14 LAB — IMMUNOFIXATION ELECTROPHORESIS
IgA: 309 mg/dL (ref 61–437)
IgG (Immunoglobin G), Serum: 1454 mg/dL (ref 603–1613)
IgM (Immunoglobulin M), Srm: 79 mg/dL (ref 20–172)
Total Protein ELP: 6.7 g/dL (ref 6.0–8.5)

## 2022-05-15 NOTE — Progress Notes (Signed)
[facility] [address], [place] [address]   CLINIC:  Medical Oncology/Hematology  PCP:  Gareth Morgan, MD [address] [place] [address] [phone]   REASON FOR VISIT:  Follow-up for abnormal SPEP  CURRENT THERAPY: Surveillance  INTERVAL HISTORY:   [name] 72 y.o. male returns for routine follow-up of abnormal SPEP.  He was last seen by Rojelio Brenner PA-C on 11/07/2021.  At today's visit, he reports feeling well.  No recent hospitalizations, surgeries, or changes in baseline health status.  He denies any new bone pain or recent fractures.  He denies any B symptoms such as fever, chills, night sweats, unintentional weight loss.   He has some chronic tinnitus, currently being worked up by primary care provider.  He has intermittent headaches and blurry vision.  He has chronic right hand numbness following arm surgery 2 years ago.   No new masses or lymphadenopathy per his report.    He continues to report that he is easily fatigued but able to complete his daily activities independently.  He continues to have urinary frequency and urgency secondary to his BPH.  He reports chronic shortness of breath with exertion but not at rest.  He has chronic neck and back pain.  He has 75% energy and 100% appetite. He endorses that he is maintaining a stable weight.   ASSESSMENT & PLAN:  1.  Abnormal SPEP - Referred by Dr. Wolfgang Phoenix due to labs from 12/20/2020 and 04/12/2021 showing a poorly defined band of restricted protein and gammaglobulin regions on SPEP/IFE.  Urine IFE showed poorly defined area of restricted protein mobility detected and is reactive with kappa light chain antisera. - Hematology work-up (10/21/2021): SPEP and immunofixation negative for monoclonal protein. Urine immunofixation unremarkable, no evidence of monoclonal protein. Elevated kappa light chains 42.1 with lambda light chains elevated at 35.4 and normal ratio 1.19.  This is in  keeping with patient's CKD stage IIIb. Mildly elevated beta-2 microglobulin 2.6.  Normal LDH. No evidence of CRAB features on CBC/CMP.  (Baseline anemia with Hgb 11.7, baseline CKD stage IIIb with creatinine 1.88, calcium 8.9) - Most recent MGUS/myeloma panel (05/09/2022): Immunofixation negative. SPEP normal. Stable elevation in kappa light chain 58.4, elevated lambda light chain 46.3, normal kappa/lambda ratio 1.26.  This is in keeping with patient CKD stage IIIb. LDH normal. Creatinine 2.41, somewhat worsened from baseline.  Normal calcium 9.1.  Mild anemia with Hgb 11.7/MCV 82.9. 24-hour urine shows elevated light chains, but immunofixation pattern is unremarkable and M spike is negative. - No new bone pain, B symptoms, or neurologic changes - PLAN: No evidence of MGUS or myeloma at this time.  We will recheck MGUS panel every 6 months x 1 year, then annually.  (Will check 24-hour urine in 1 year.)  Would consider discharge from clinic if he demonstrates long-term stability w/ absence of monoclonal protein. - If any evidence of monoclonal protein on future labs, we will check skeletal survey and consider bone marrow biopsy if indicated. - Labs in 6 months.  Office visit after labs.   PLAN SUMMARY: >> Labs in 6 months = CMP, CBC/D, LDH, SPEP, immunofixation, light chains, ferritin, iron/TIBC >> OFFICE visit in 6 months (1 week after labs)     REVIEW OF SYSTEMS:   Review of Systems  Constitutional:  Positive for fatigue. Negative for appetite change, chills, diaphoresis, fever and unexpected weight change.  HENT:   Negative for lump/mass and nosebleeds.   Eyes:  Negative  for eye problems.  Respiratory:  Positive for cough and shortness of breath. Negative for hemoptysis.   Cardiovascular:  Negative for chest pain, leg swelling and palpitations.  Gastrointestinal:  Negative for abdominal pain, blood in stool, constipation, diarrhea, nausea and vomiting.  Genitourinary:  Negative for  hematuria.   Skin: Negative.   Neurological:  Positive for numbness (right arm). Negative for dizziness, headaches and light-headedness.  Hematological:  Does not bruise/bleed easily.     PHYSICAL EXAM:  ECOG PERFORMANCE STATUS: 1 - Symptomatic but completely ambulatory  There were no vitals filed for this visit. There were no vitals filed for this visit. Physical Exam Constitutional:      Appearance: Normal appearance. He is obese.  Cardiovascular:     Heart sounds: Normal heart sounds.  Pulmonary:     Breath sounds: Normal breath sounds.  Neurological:     General: No focal deficit present.     Mental Status: Mental status is at baseline.  Psychiatric:        Behavior: Behavior normal. Behavior is cooperative.     PAST MEDICAL/SURGICAL HISTORY:  Past Medical History:  Diagnosis Date   Abnormal CT of the chest 01/23/14   BPH (benign prostatic hyperplasia) 05/31/2018   Complication of anesthesia    hard to wake up    Diabetes mellitus without complication (HCC)    Elevated PSA    Erectile dysfunction    Gastroesophageal reflux    H/O sarcoidosis    Headache    7-8 months from gabapentin and naproxen   Hypertension    Pneumonia of upper lobe of lung 12/1213   Past Surgical History:  Procedure Laterality Date   ANTERIOR CERVICAL DECOMP/DISCECTOMY FUSION N/A 05/28/2018   CATARACT EXTRACTION W/PHACO Left 04/11/2019   Procedure: CATARACT EXTRACTION PHACO AND INTRAOCULAR LENS PLACEMENT (IOC) (CDE:5.80);  Surgeon: Fabio PierceWrzosek, James, MD;  Location: [facility];  Service: Ophthalmology;  Laterality: Left;   CATARACT EXTRACTION W/PHACO Right 06/03/2019   Procedure: CATARACT EXTRACTION PHACO AND INTRAOCULAR LENS PLACEMENT RIGHT EYE;  Surgeon: Fabio PierceWrzosek, James, MD;  Location: [facility];  Service: Ophthalmology;  Laterality: Right;  CDE: 9.82   COLONOSCOPY     COLONOSCOPY N/A 06/23/2014   Procedure: COLONOSCOPY;  Surgeon: Franky MachoMark Jenkins Md, MD;  Location: [facility];  Service: Gastroenterology;   Laterality: N/A;   EYE SURGERY Bilateral    lasic   ORTHOPEDIC SURGERY Left    heel surgery   PROSTATE BIOPSY     SHOULDER ARTHROSCOPY Right    repair of tendons and ligaments   TONSILLECTOMY      SOCIAL HISTORY:  Social History   Socioeconomic History   Marital status: Married    Spouse name: Not on file   Number of children: 2   Years of education: Not on file   Highest education level: Not on file  Occupational History   Occupation: concrete finisher  Tobacco Use   Smoking status: Former    Types: Cigars   Smokeless tobacco: Never   Tobacco comments:    and pipe-none since 2010  Vaping Use   Vaping Use: Never used  Substance and Sexual Activity   Alcohol use: No   Drug use: No   Sexual activity: Yes    Birth control/protection: None  Other Topics Concern   Not on file  Social History Narrative   Not on file   Social Determinants of Health   Financial Resource Strain: Not on file  Food Insecurity: Not on file  Transportation Needs:  Not on file  Physical Activity: Not on file  Stress: Not on file  Social Connections: Not on file  Intimate Partner Violence: Not on file    FAMILY HISTORY:  Family History  Problem Relation Age of Onset   Lymphoma Mother    Heart disease Father    Hyperlipidemia Sister    Diabetes Sister    Heart disease Sister    Breast cancer Sister    Hyperlipidemia Brother    Diabetes Brother     CURRENT MEDICATIONS:  Outpatient Encounter Medications as of 05/16/2022  Medication Sig   alfuzosin (UROXATRAL) 10 MG 24 hr tablet Take 1 tablet (10 mg total) by mouth daily with breakfast.   aspirin EC 81 MG tablet Take 81 mg by mouth daily.   celecoxib (CELEBREX) 200 MG capsule Take 200 mg by mouth daily. (Patient not taking: Reported on 02/10/2022)   EPINEPHrine 0.3 mg/0.3 mL IJ SOAJ injection Inject into the muscle as directed.   finasteride (PROSCAR) 5 MG tablet Take 1 tablet (5 mg total) by mouth daily.   FREESTYLE LITE test strip  SMARTSIG:Via Meter   Glucosamine-Chondroitin (COSAMIN DS PO) Take 1 tablet by mouth daily.   hydrochlorothiazide (HYDRODIURIL) 25 MG tablet Take 0.5 tablets (12.5 mg total) by mouth daily. (Patient not taking: Reported on 02/10/2022)   irbesartan (AVAPRO) 150 MG tablet TAKE ONE TABLET (150MG  TOTAL) BY MOUTH DAILY   Lancets (FREESTYLE) lancets 1 each 2 (two) times daily.   metFORMIN (GLUCOPHAGE) 500 MG tablet Take 500 mg by mouth 2 (two) times daily. (Patient not taking: Reported on 02/10/2022)   mirabegron ER (MYRBETRIQ) 25 MG TB24 tablet Take 1 tablet (25 mg total) by mouth daily. (Patient not taking: Reported on 02/10/2022)   Multiple Vitamin (MULTIVITAMIN WITH MINERALS) TABS tablet Take 1 tablet by mouth daily.   omeprazole (PRILOSEC) 40 MG capsule Take 40 mg by mouth every other day.    pravastatin (PRAVACHOL) 10 MG tablet Take 10 mg by mouth at bedtime.   tadalafil (CIALIS) 5 MG tablet Take 1 tablet (5 mg total) by mouth daily.   No facility-administered encounter medications on file as of 05/16/2022.    ALLERGIES:  Allergies  Allergen Reactions   Diclofenac Anaphylaxis   Diclofenac Sodium Anaphylaxis    Topical gel   Levaquin [Levofloxacin In D5w] Anaphylaxis   Lidocaine Anaphylaxis    LABORATORY DATA:  I have reviewed the labs as listed.  CBC    Component Value Date/Time   WBC 6.9 05/09/2022 0916   RBC 4.21 (L) 05/09/2022 0916   HGB 11.7 (L) 05/09/2022 0916   HGB 12.4 (L) 03/28/2021 1008   HCT 34.9 (L) 05/09/2022 0916   HCT 37.0 (L) 03/28/2021 1008   PLT 236 05/09/2022 0916   PLT 274 03/28/2021 1008   MCV 82.9 05/09/2022 0916   MCV 81 03/28/2021 1008   MCH 27.8 05/09/2022 0916   MCHC 33.5 05/09/2022 0916   RDW 12.4 05/09/2022 0916   RDW 11.6 03/28/2021 1008   LYMPHSABS 1.7 05/09/2022 0916   LYMPHSABS 1.6 03/28/2021 1008   MONOABS 0.7 05/09/2022 0916   EOSABS 0.5 05/09/2022 0916   EOSABS 0.3 03/28/2021 1008   BASOSABS 0.0 05/09/2022 0916   BASOSABS 0.0 03/28/2021 1008       Latest Ref Rng & Units 05/09/2022    9:16 AM 10/21/2021    2:01 PM 03/28/2021   10:09 AM  CMP  Glucose 70 - 99 mg/dL 409  811  914   BUN 8 -  23 mg/dL 30  19  25    Creatinine 0.61 - 1.24 mg/dL 3.38  2.50  5.39   Sodium 135 - 145 mmol/L 140  138  141   Potassium 3.5 - 5.1 mmol/L 3.7  4.1  4.4   Chloride 98 - 111 mmol/L 106  107  104   CO2 22 - 32 mmol/L 27  25  24    Calcium 8.9 - 10.3 mg/dL 9.1  8.9  9.8   Total Protein 6.5 - 8.1 g/dL 7.2  6.6    Total Bilirubin 0.3 - 1.2 mg/dL 0.8  0.6    Alkaline Phos 38 - 126 U/L 83  59    AST 15 - 41 U/L 24  19    ALT 0 - 44 U/L 19  19      DIAGNOSTIC IMAGING:  I have independently reviewed the relevant imaging and discussed with the patient.   WRAP UP:  All questions were answered. The patient knows to call the clinic with any problems, questions or concerns.  Medical decision making: Low  Time spent on visit: I spent 15 minutes counseling the patient face to face. The total time spent in the appointment was 22 minutes and more than 50% was on counseling.  Carnella Guadalajara, PA-C  05/16/22 9:36 AM

## 2022-05-16 ENCOUNTER — Inpatient Hospital Stay (HOSPITAL_BASED_OUTPATIENT_CLINIC_OR_DEPARTMENT_OTHER): Payer: PPO | Admitting: Physician Assistant

## 2022-05-16 VITALS — BP 130/75 | HR 71 | Temp 98.2°F | Resp 16 | Wt 242.5 lb

## 2022-05-16 DIAGNOSIS — R778 Other specified abnormalities of plasma proteins: Secondary | ICD-10-CM | POA: Diagnosis not present

## 2022-05-16 DIAGNOSIS — R779 Abnormality of plasma protein, unspecified: Secondary | ICD-10-CM | POA: Diagnosis not present

## 2022-05-16 NOTE — Patient Instructions (Signed)
[facility] **VISIT SUMMARY & IMPORTANT INSTRUCTIONS **   You were seen today by Rojelio Brenner PA-C for your follow-up visit.    ABNORMAL PROTEIN You were sent to [place] by your kidney doctor to evaluate possible abnormal protein in your blood. The tests that we checked did not show any signs of this abnormal protein. We will recheck these tests again in 6 months to make sure that we still do not see any abnormal protein. This abnormal protein (monoclonal protein) is associated with a condition called MGUS, which can progress to a type of cancer called multiple myeloma.  At this time we do NOT see any sign of either MGUS or myeloma in your blood work.  LABS: Return in 6 months for repeat labs  FOLLOW-UP APPOINTMENT: Office visit in 6 months, 1 week after labs and urine study  ** Thank you for trusting me with your healthcare!  I strive to provide all of my patients with quality care at each visit.  If you receive a survey for this visit, I would be so grateful to you for taking the time to provide feedback.  Thank you in advance!  ~ Zeah Germano                   Dr. Doreatha Massed   &   Rojelio Brenner, PA-C   - - - - - - - - - - - - - - - - - -    Thank you for choosing  [facility] at [facility] to provide your oncology and hematology care.  To afford each patient quality time with our provider, please arrive at least 15 minutes before your scheduled appointment time.   If you have a lab appointment with the Cancer Center please come in thru the Main Entrance and check in at the main information desk.  You need to re-schedule your appointment should you arrive 10 or more minutes late.  We strive to give you quality time with our providers, and arriving late affects you and other patients whose appointments are after yours.  Also, if you no show three or more times for appointments you may be dismissed from the clinic at the  providers discretion.     Again, thank you for choosing [facility].  Our hope is that these requests will decrease the amount of time that you wait before being seen by our physicians.       _____________________________________________________________  Should you have questions after your visit to [facility], please contact our office at [phone] and follow the prompts.  Our office hours are 8:00 a.m. and 4:30 p.m. Monday - Friday.  Please note that voicemails left after 4:00 p.m. may not be returned until the following business day.  We are closed weekends and major holidays.  You do have access to a nurse 24-7, just call the main number to the clinic [phone] and do not press any options, hold on the line and a nurse will answer the phone.    For prescription refill requests, have your pharmacy contact our office and allow 72 hours.

## 2022-05-19 ENCOUNTER — Other Ambulatory Visit: Payer: Self-pay

## 2022-05-19 DIAGNOSIS — R778 Other specified abnormalities of plasma proteins: Secondary | ICD-10-CM

## 2022-05-19 DIAGNOSIS — D472 Monoclonal gammopathy: Secondary | ICD-10-CM

## 2022-05-20 ENCOUNTER — Other Ambulatory Visit: Payer: Self-pay | Admitting: Orthopedic Surgery

## 2022-05-20 ENCOUNTER — Telehealth: Payer: Self-pay | Admitting: Cardiology

## 2022-05-20 NOTE — Telephone Encounter (Signed)
   Pre-operative Risk Assessment    Patient Name: JULIOCESAR BLASIUS  DOB: August 20, 1950 MRN: [id number]      Request for Surgical Clearance    Procedure:   right ulnar nerve submuscular transposition   Date of Surgery:  Clearance 06/12/22                                 Surgeon:  Dr. Stoney Bang Surgeon's Group or Practice Name:  [facility] Phone number:  [phone] Fax number:  [phone]   Type of Clearance Requested:   - Medical  - Pharmacy:  Hold Aspirin TBD    Type of Anesthesia:  General  axillary block   Additional requests/questions:      SignedFilomena Jungling   05/20/2022, 1:45 PM

## 2022-05-21 ENCOUNTER — Telehealth: Payer: Self-pay | Admitting: *Deleted

## 2022-05-21 NOTE — Telephone Encounter (Signed)
   Name: Thomas Malone  DOB: [birth year]  MRN: [id number]  Primary Cardiologist: Meriam Sprague, MD  Chart reviewed as part of pre-operative protocol coverage. Because of Grantham Hippert Favaro's past medical history and time since last visit, he will require a follow-up telephone visit in order to better assess preoperative cardiovascular risk.  Pre-op covering staff: - Please schedule appointment and call patient to inform them. If patient already had an upcoming appointment within acceptable timeframe, please add "pre-op clearance" to the appointment notes so provider is aware. - Please contact requesting surgeon's office via preferred method (i.e, phone, fax) to inform them of need for appointment prior to surgery.  He should be able to hold his ASA X 7 day if he remains asymptomatic at telephone visit.   Sharlene Dory, PA-C  05/21/2022, 7:31 AM

## 2022-05-21 NOTE — Telephone Encounter (Signed)
  Patient Consent for Virtual Visit         Thomas Malone has provided verbal consent on 05/21/2022 for a virtual visit (video or telephone).   CONSENT FOR VIRTUAL VISIT FOR:  Thomas Malone  By participating in this virtual visit I agree to the following:  I hereby voluntarily request, consent and authorize [facility] and its employed or contracted physicians, physician assistants, nurse practitioners or other licensed health care professionals (the Practitioner), to provide me with telemedicine health care services (the "Services") as deemed necessary by the treating Practitioner. I acknowledge and consent to receive the Services by the Practitioner via telemedicine. I understand that the telemedicine visit will involve communicating with the Practitioner through live audiovisual communication technology and the disclosure of certain medical information by electronic transmission. I acknowledge that I have been given the opportunity to request an in-person assessment or other available alternative prior to the telemedicine visit and am voluntarily participating in the telemedicine visit.  I understand that I have the right to withhold or withdraw my consent to the use of telemedicine in the course of my care at any time, without affecting my right to future care or treatment, and that the Practitioner or I may terminate the telemedicine visit at any time. I understand that I have the right to inspect all information obtained and/or recorded in the course of the telemedicine visit and may receive copies of available information for a reasonable fee.  I understand that some of the potential risks of receiving the Services via telemedicine include:  Delay or interruption in medical evaluation due to technological equipment failure or disruption; Information transmitted may not be sufficient (e.g. poor resolution of images) to allow for appropriate medical decision making by the  Practitioner; and/or  In rare instances, security protocols could fail, causing a breach of personal health information.  Furthermore, I acknowledge that it is my responsibility to provide information about my medical history, conditions and care that is complete and accurate to the best of my ability. I acknowledge that Practitioner's advice, recommendations, and/or decision may be based on factors not within their control, such as incomplete or inaccurate data provided by me or distortions of diagnostic images or specimens that may result from electronic transmissions. I understand that the practice of medicine is not an exact science and that Practitioner makes no warranties or guarantees regarding treatment outcomes. I acknowledge that a copy of this consent can be made available to me via my patient portal [facility] MyChart), or I can request a printed copy by calling the office of [facility].    I understand that my insurance will be billed for this visit.   I have read or had this consent read to me. I understand the contents of this consent, which adequately explains the benefits and risks of the Services being provided via telemedicine.  I have been provided ample opportunity to ask questions regarding this consent and the Services and have had my questions answered to my satisfaction. I give my informed consent for the services to be provided through the use of telemedicine in my medical care

## 2022-05-28 ENCOUNTER — Ambulatory Visit: Payer: PPO | Attending: Internal Medicine | Admitting: Nurse Practitioner

## 2022-05-28 DIAGNOSIS — Z01810 Encounter for preprocedural cardiovascular examination: Secondary | ICD-10-CM | POA: Diagnosis not present

## 2022-05-28 NOTE — Progress Notes (Signed)
Virtual Visit via Telephone Note   Because of Thomas Malone's co-morbid illnesses, he is at least at moderate risk for complications without adequate follow up.  This format is felt to be most appropriate for this patient at this time.  The patient did not have access to video technology/had technical difficulties with video requiring transitioning to audio format only (telephone).  All issues noted in this document were discussed and addressed.  No physical exam could be performed with this format.  Please refer to the patient's chart for his consent to telehealth for [facility]  Evaluation Performed:  Preoperative cardiovascular risk assessment _____________   Date:  05/28/2022   Patient ID:  Thomas Malone, DOB 13-Jan-1951, MRN [id number] Patient Location:  Home Provider location:   Office  Primary Care Provider:  Gareth Morgan, MD Primary Cardiologist:  Meriam Sprague, MD  Chief Complaint / Patient Profile   72 y.o. y/o male with a h/o dyspnea on exertion, hypertension, hyperlipidemia, OSA, and remote pulmonary sarcoidosis who is pending right ulnar nerve submuscular transposition on 06/12/2022 with Dr[facility] and presents today for telephonic preoperative cardiovascular risk assessment.  History of Present Illness    Thomas Malone is a 72 y.o. male who presents via audio/video conferencing for a telehealth visit today.  Pt was last seen in cardiology clinic on 02/10/2022 by Dr. Jenene Slicker.  At that time Thomas Malone was doing well. The patient is now pending procedure as outlined above. Since his last visit, he has done well from a cardiac standpoint.   He denies chest pain, palpitations, dyspnea, pnd, orthopnea, n, v, dizziness, syncope, edema, weight gain, or early satiety. All other systems reviewed and are otherwise negative except as noted above.   Past Medical History    Past Medical History:  Diagnosis Date   Abnormal CT of the  chest 01/23/14   BPH (benign prostatic hyperplasia) 05/31/2018   Complication of anesthesia    hard to wake up    Diabetes mellitus without complication (HCC)    Elevated PSA    Erectile dysfunction    Gastroesophageal reflux    H/O sarcoidosis    Headache    7-8 months from gabapentin and naproxen   Hypertension    Pneumonia of upper lobe of lung 12/1213   Past Surgical History:  Procedure Laterality Date   ANTERIOR CERVICAL DECOMP/DISCECTOMY FUSION N/A 05/28/2018   CATARACT EXTRACTION W/PHACO Left 04/11/2019   Procedure: CATARACT EXTRACTION PHACO AND INTRAOCULAR LENS PLACEMENT (IOC) (CDE:5.80);  Surgeon: Fabio Pierce, MD;  Location: [facility];  Service: Ophthalmology;  Laterality: Left;   CATARACT EXTRACTION W/PHACO Right 06/03/2019   Procedure: CATARACT EXTRACTION PHACO AND INTRAOCULAR LENS PLACEMENT RIGHT EYE;  Surgeon: Fabio Pierce, MD;  Location: [facility];  Service: Ophthalmology;  Laterality: Right;  CDE: 9.82   COLONOSCOPY     COLONOSCOPY N/A 06/23/2014   Procedure: COLONOSCOPY;  Surgeon: Franky Macho Md, MD;  Location: [facility];  Service: Gastroenterology;  Laterality: N/A;   EYE SURGERY Bilateral    lasic   ORTHOPEDIC SURGERY Left    heel surgery   PROSTATE BIOPSY     SHOULDER ARTHROSCOPY Right    repair of tendons and ligaments   TONSILLECTOMY      Allergies  Allergies  Allergen Reactions   Diclofenac Anaphylaxis   Diclofenac Sodium Anaphylaxis    Topical gel   Levaquin [Levofloxacin In D5w] Anaphylaxis   Lidocaine Anaphylaxis  Home Medications    Prior to Admission medications   Medication Sig Start Date End Date Taking? Authorizing Provider  alfuzosin (UROXATRAL) 10 MG 24 hr tablet Take 1 tablet (10 mg total) by mouth daily with breakfast. 04/11/22   McKenzie, Mardene Celeste, MD  amLODipine (NORVASC) 10 MG tablet Take by mouth. 02/05/22   [provider]  aspirin EC 81 MG tablet Take 81 mg by mouth daily.    [provider]  chlorthalidone  (HYGROTON) 25 MG tablet Take 25 mg by mouth daily. 05/18/22   [provider]  finasteride (PROSCAR) 5 MG tablet Take 1 tablet (5 mg total) by mouth daily. 04/11/22   Malen Gauze, MD  FREESTYLE LITE test strip SMARTSIG:Via Meter 01/10/20   [provider]  glipiZIDE (GLUCOTROL) 5 MG tablet Take 5 mg by mouth every morning.    [provider]  Glucosamine-Chondroitin (COSAMIN DS PO) Take 1 tablet by mouth daily.    [provider]  hydrochlorothiazide (HYDRODIURIL) 25 MG tablet Take 0.5 tablets (12.5 mg total) by mouth daily. 08/08/21 08/03/22  Meriam Sprague, MD  irbesartan (AVAPRO) 150 MG tablet TAKE ONE TABLET (  TOTAL) BY MOUTH DAILY 05/13/22   Nyoka Cowden, MD  Lancets (FREESTYLE) lancets 1 each 2 (two) times daily. 11/03/19   [provider]  mirabegron ER (MYRBETRIQ) 25 MG TB24 tablet Take 1 tablet (25 mg total) by mouth daily. 10/11/21   McKenzie, Mardene Celeste, MD  Multiple Vitamin (MULTIVITAMIN WITH MINERALS) TABS tablet Take 1 tablet by mouth daily.    [provider]  omeprazole (PRILOSEC) 40 MG capsule Take 40 mg by mouth every other day.     [provider]  pravastatin (PRAVACHOL) 10 MG tablet Take 10 mg by mouth at bedtime.    [provider]  pravastatin (PRAVACHOL) 10 MG tablet Take 10 mg by mouth daily.    [provider]  tadalafil (CIALIS) 5 MG tablet Take 1 tablet (5 mg total) by mouth daily. 04/11/22   Malen Gauze, MD    Physical Exam    Vital Signs:  Thomas Malone does not have vital signs available for review today.  Given telephonic nature of communication, physical exam is limited. AAOx3. NAD. Normal affect.  Speech and respirations are unlabored.  Accessory Clinical Findings    None  Assessment & Plan    1.  Preoperative Cardiovascular Risk Assessment:  According to the Revised Cardiac Risk Index (RCRI), his Perioperative Risk of Major Cardiac Event is (%): 0.4. His  Functional Capacity in METs is: 8.23 according to the Duke Activity Status Index (DASI). Therefore, based on ACC/AHA guidelines, patient would be at acceptable risk for the planned procedure without further cardiovascular testing.   The patient was advised that if he develops new symptoms prior to surgery to contact our office to arrange for a follow-up visit, and he verbalized understanding.  Per office protocol, he may hold Aspirin for 5-7 days prior to procedure. Please resume Aspirin as soon as possible postprocedure, at the discretion of the surgeon.    A copy of this note will be routed to requesting surgeon.  Time:   Today, I have spent 5 minutes with the patient with telehealth technology discussing medical history, symptoms, and management plan.     Joylene Grapes, NP  05/28/2022, 9:28 AM

## 2022-06-05 ENCOUNTER — Encounter (HOSPITAL_BASED_OUTPATIENT_CLINIC_OR_DEPARTMENT_OTHER): Payer: Self-pay | Admitting: Orthopedic Surgery

## 2022-06-05 ENCOUNTER — Other Ambulatory Visit: Payer: Self-pay

## 2022-06-09 ENCOUNTER — Encounter (HOSPITAL_BASED_OUTPATIENT_CLINIC_OR_DEPARTMENT_OTHER)
Admission: RE | Admit: 2022-06-09 | Discharge: 2022-06-09 | Disposition: A | Discharge status: Home / Self Care | Payer: PPO | Source: Ambulatory Visit | Attending: Orthopedic Surgery | Admitting: Orthopedic Surgery

## 2022-06-09 DIAGNOSIS — Z01812 Encounter for preprocedural laboratory examination: Secondary | ICD-10-CM | POA: Insufficient documentation

## 2022-06-09 LAB — BASIC METABOLIC PANEL
Anion gap: 9 (ref 5–15)
BUN: 21 mg/dL (ref 8–23)
CO2: 26 mmol/L (ref 22–32)
Calcium: 9 mg/dL (ref 8.9–10.3)
Chloride: 104 mmol/L (ref 98–111)
Creatinine, Ser: 2.21 mg/dL — ABNORMAL HIGH (ref 0.61–1.24)
GFR, Estimated: 31 mL/min — ABNORMAL LOW (ref >60)
Glucose, Bld: 159 mg/dL — ABNORMAL HIGH (ref 70–99)
Potassium: 4.5 mmol/L (ref 3.5–5.1)
Sodium: 139 mmol/L (ref 135–145)

## 2022-06-09 NOTE — Progress Notes (Signed)

## 2022-06-11 ENCOUNTER — Encounter (HOSPITAL_COMMUNITY): Payer: Self-pay | Admitting: Anesthesiology

## 2022-06-12 ENCOUNTER — Ambulatory Visit (HOSPITAL_BASED_OUTPATIENT_CLINIC_OR_DEPARTMENT_OTHER): Admission: RE | Admit: 2022-06-12 | Payer: PPO | Source: Home / Self Care | Admitting: Orthopedic Surgery

## 2022-06-12 DIAGNOSIS — E119 Type 2 diabetes mellitus without complications: Secondary | ICD-10-CM

## 2022-06-12 DIAGNOSIS — Z01818 Encounter for other preprocedural examination: Secondary | ICD-10-CM

## 2022-06-12 DIAGNOSIS — I10 Essential (primary) hypertension: Secondary | ICD-10-CM

## 2022-06-12 HISTORY — DX: Sleep apnea, unspecified: G47.30

## 2022-06-12 SURGERY — ANTERIOR INTEROSSEOUS NERVE DECOMPRESSION
Anesthesia: General | Site: Elbow | Laterality: Right

## 2022-06-13 ENCOUNTER — Other Ambulatory Visit: Payer: Self-pay | Admitting: Internal Medicine

## 2022-06-13 DIAGNOSIS — I10 Essential (primary) hypertension: Secondary | ICD-10-CM

## 2022-07-05 ENCOUNTER — Other Ambulatory Visit: Payer: Self-pay

## 2022-07-05 ENCOUNTER — Encounter (HOSPITAL_COMMUNITY): Payer: Self-pay | Admitting: Emergency Medicine

## 2022-07-05 ENCOUNTER — Emergency Department (HOSPITAL_COMMUNITY)
Admission: EM | Admit: 2022-07-05 | Discharge: 2022-07-05 | Disposition: A | Discharge status: Home / Self Care | Payer: PPO | Attending: Student | Admitting: Student

## 2022-07-05 DIAGNOSIS — Z23 Encounter for immunization: Secondary | ICD-10-CM | POA: Insufficient documentation

## 2022-07-05 DIAGNOSIS — N1831 Chronic kidney disease, stage 3a: Secondary | ICD-10-CM | POA: Insufficient documentation

## 2022-07-05 DIAGNOSIS — Z79899 Other long term (current) drug therapy: Secondary | ICD-10-CM | POA: Insufficient documentation

## 2022-07-05 DIAGNOSIS — Z87891 Personal history of nicotine dependence: Secondary | ICD-10-CM | POA: Diagnosis not present

## 2022-07-05 DIAGNOSIS — I129 Hypertensive chronic kidney disease with stage 1 through stage 4 chronic kidney disease, or unspecified chronic kidney disease: Secondary | ICD-10-CM | POA: Insufficient documentation

## 2022-07-05 DIAGNOSIS — W268XXA Contact with other sharp object(s), not elsewhere classified, initial encounter: Secondary | ICD-10-CM | POA: Diagnosis not present

## 2022-07-05 DIAGNOSIS — Z7984 Long term (current) use of oral hypoglycemic drugs: Secondary | ICD-10-CM | POA: Insufficient documentation

## 2022-07-05 DIAGNOSIS — S61412A Laceration without foreign body of left hand, initial encounter: Secondary | ICD-10-CM | POA: Diagnosis not present

## 2022-07-05 DIAGNOSIS — Z7982 Long term (current) use of aspirin: Secondary | ICD-10-CM | POA: Insufficient documentation

## 2022-07-05 DIAGNOSIS — E1122 Type 2 diabetes mellitus with diabetic chronic kidney disease: Secondary | ICD-10-CM | POA: Insufficient documentation

## 2022-07-05 MED ORDER — TETRACAINE HCL 1 % IJ SOLN: 10.0000 mg | Freq: Once | INTRAMUSCULAR | Status: DC

## 2022-07-05 MED ORDER — TETANUS-DIPHTH-ACELL PERTUSSIS 5-2.5-18.5 LF-MCG/0.5 IM SUSY
0.5000 mL | PREFILLED_SYRINGE | Freq: Once | INTRAMUSCULAR | Status: AC
  Administered 2022-07-05: 0.5 mL via INTRAMUSCULAR
  Filled 2022-07-05: qty 0.5

## 2022-07-05 MED ORDER — CHLOROPROCAINE HCL 2 % IJ SOLN
10.0000 mL | Freq: Once | INTRAMUSCULAR | Status: AC
  Administered 2022-07-05: 10 mL
  Filled 2022-07-05: qty 30

## 2022-07-05 NOTE — ED Notes (Signed)
Cleanse laceration to left palm with wound cleanser. No problem noted.

## 2022-07-05 NOTE — ED Notes (Signed)
Dsg applied to left hand

## 2022-07-05 NOTE — ED Triage Notes (Signed)
Pt presents with 2-inch laceration to left palm sustained about 30 mins PTA with smooth blade. Last tetanus shot was over 10 years ago. Bleeding controlled in triage.

## 2022-07-05 NOTE — ED Provider Notes (Signed)
[place] EMERGENCY DEPARTMENT AT [facility] Provider Note  CSN: [id number] Arrival date & time: 07/05/22 1119  Chief Complaint(s) Laceration  HPI Thomas Malone is a 72 y.o. male who presents emergency department for evaluation of a left hand laceration.  Patient states he was using a "scraper" and suffered a laceration to the thenar eminence of the left hand.  Denies numbness, tingling, weakness or other neurologic complaints.  No additional traumatic complaints today.  Tetanus is not up-to-date.  Bleeding controlled in triage.   Past Medical History Past Medical History:  Diagnosis Date   Abnormal CT of the chest 01/23/2014   BPH (benign prostatic hyperplasia) 05/31/2018   Complication of anesthesia    hard to wake up    Diabetes mellitus without complication (HCC)    Elevated PSA    Erectile dysfunction    Gastroesophageal reflux    H/O sarcoidosis    Headache    7-8 months from gabapentin and naproxen   Hypertension    Pneumonia of upper lobe of lung 12/1213   Sleep apnea    Patient Active Problem List   Diagnosis Date Noted   OSA on CPAP 02/10/2022   Allergic rhinitis 05/09/2021   DOE (dyspnea on exertion) 03/28/2021   Elevated PSA 06/22/2019   Dysphagia 05/31/2018   Chronic kidney disease, stage 3a (HCC) 05/31/2018   BPH (benign prostatic hyperplasia) 05/31/2018   Postoperative seroma involving digestive system after non-digestive system procedure 05/30/2018   H/O sarcoidosis    Essential hypertension    Erectile dysfunction    Home Medication(s) Prior to Admission medications   Medication Sig Start Date End Date Taking? Authorizing Provider  alfuzosin (UROXATRAL) 10 MG 24 hr tablet Take 1 tablet (10 mg total) by mouth daily with breakfast. 04/11/22   McKenzie, Mardene Celeste, MD  amLODipine (NORVASC) 10 MG tablet Take by mouth. 02/05/22   [provider]  aspirin EC 81 MG tablet Take 81 mg by mouth daily.    [provider]  chlorthalidone  (HYGROTON) 25 MG tablet Take 25 mg by mouth daily. 05/18/22   [provider]  finasteride (PROSCAR) 5 MG tablet Take 1 tablet (5 mg total) by mouth daily. 04/11/22   Malen Gauze, MD  FREESTYLE LITE test strip SMARTSIG:Via Meter 01/10/20   [provider]  glipiZIDE (GLUCOTROL) 5 MG tablet Take 5 mg by mouth every morning.    [provider]  Glucosamine-Chondroitin (COSAMIN DS PO) Take 1 tablet by mouth daily.    [provider]  hydrochlorothiazide (HYDRODIURIL) 25 MG tablet Take 0.5 tablets (12.5 mg total) by mouth daily. 08/08/21 08/03/22  Meriam Sprague, MD  irbesartan (AVAPRO) 150 MG tablet TAKE ONE TABLET (150MG  TOTAL) BY MOUTH DAILY 05/13/22   Nyoka Cowden, MD  Lancets (FREESTYLE) lancets 1 each 2 (two) times daily. 11/03/19   [provider]  mirabegron ER (MYRBETRIQ) 25 MG TB24 tablet Take 1 tablet (25 mg total) by mouth daily. 10/11/21   McKenzie, Mardene Celeste, MD  Multiple Vitamin (MULTIVITAMIN WITH MINERALS) TABS tablet Take 1 tablet by mouth daily.    [provider]  omeprazole (PRILOSEC) 40 MG capsule Take 40 mg by mouth every other day.     [provider]  pravastatin (PRAVACHOL) 10 MG tablet Take 10 mg by mouth at bedtime.    [provider]  pravastatin (PRAVACHOL) 10 MG tablet Take 10 mg by mouth daily.    [provider]  tadalafil (CIALIS) 5  MG tablet Take 1 tablet (5 mg total) by mouth daily. 04/11/22   McKenzie, Mardene Celeste, MD                                                                                                                                    Past Surgical History Past Surgical History:  Procedure Laterality Date   ANTERIOR CERVICAL DECOMP/DISCECTOMY FUSION N/A 05/28/2018   CATARACT EXTRACTION W/PHACO Left 04/11/2019   Procedure: CATARACT EXTRACTION PHACO AND INTRAOCULAR LENS PLACEMENT (IOC) (CDE:5.80);  Surgeon: Fabio Pierce, MD;  Location: [facility];  Service: Ophthalmology;   Laterality: Left;   CATARACT EXTRACTION W/PHACO Right 06/03/2019   Procedure: CATARACT EXTRACTION PHACO AND INTRAOCULAR LENS PLACEMENT RIGHT EYE;  Surgeon: Fabio Pierce, MD;  Location: [facility];  Service: Ophthalmology;  Laterality: Right;  CDE: 9.82   COLONOSCOPY     COLONOSCOPY N/A 06/23/2014   Procedure: COLONOSCOPY;  Surgeon: Franky Macho Md, MD;  Location: [facility];  Service: Gastroenterology;  Laterality: N/A;   ELBOW SURGERY Right    2021   EYE SURGERY Bilateral    lasic   ORTHOPEDIC SURGERY Left    heel surgery   PROSTATE BIOPSY     SHOULDER ARTHROSCOPY Right    repair of tendons and ligaments   TONSILLECTOMY     WRIST SURGERY     2021   Family History Family History  Problem Relation Age of Onset   Lymphoma Mother    Heart disease Father    Hyperlipidemia Sister    Diabetes Sister    Heart disease Sister    Breast cancer Sister    Hyperlipidemia Brother    Diabetes Brother     Social History Social History   Tobacco Use   Smoking status: Former    Types: Cigars   Smokeless tobacco: Never   Tobacco comments:    and pipe-none since 2010  Vaping Use   Vaping Use: Never used  Substance Use Topics   Alcohol use: No   Drug use: No   Allergies Diclofenac, Diclofenac sodium, Levaquin [levofloxacin in d5w], and Lidocaine  Review of Systems Review of Systems  Skin:  Positive for wound.    Physical Exam Vital Signs  I have reviewed the triage vital signs BP 116/80 (BP Location: Right Arm)   Pulse 75   Temp 98 F (36.7 C) (Oral)   Resp 16   Ht 5\' 10"  (1.778 m)   Wt 108.9 kg   SpO2 99%   BMI 34.44 kg/m   Physical Exam Constitutional:      General: He is not in acute distress.    Appearance: Normal appearance.  HENT:     Head: Normocephalic and atraumatic.     Nose: No congestion or rhinorrhea.  Eyes:     General:        Right eye: No discharge.        Left eye: No discharge.  Extraocular Movements: Extraocular movements intact.      Pupils: Pupils are equal, round, and reactive to light.  Cardiovascular:     Rate and Rhythm: Normal rate and regular rhythm.     Heart sounds: No murmur heard. Pulmonary:     Effort: No respiratory distress.     Breath sounds: No wheezing or rales.  Abdominal:     General: There is no distension.     Tenderness: There is no abdominal tenderness.  Musculoskeletal:        General: Normal range of motion.     Cervical back: Normal range of motion.  Skin:    General: Skin is warm and dry.     Findings: Lesion present.  Neurological:     General: No focal deficit present.     Mental Status: He is alert.     ED Results and Treatments Labs (all labs ordered are listed, but only abnormal results are displayed) Labs Reviewed - No data to display                                                                                                                        Radiology No results found.  Pertinent labs & imaging results that were available during my care of the patient were reviewed by me and considered in my medical decision making (see MDM for details).  Medications Ordered in ED Medications  Tdap (BOOSTRIX) injection 0.5 mL (0.5 mLs Intramuscular Given 07/05/22 1205)  chloroprocaine (NESACAINE-MPF) 2 % injection 10 mL (10 mLs Infiltration Given 07/05/22 1209)                                                                                                                                     Procedures .Marland KitchenLaceration Repair  Date/Time: 07/05/2022 7:06 PM  Performed by: Glendora Score, MD Authorized by: Glendora Score, MD   Anesthesia:    Anesthesia method:  Local infiltration   Local anesthetic: 1% chloroprocaine. Laceration details:    Location:  Hand   Hand location:  L palm   Length (cm):  3 Treatment:    Area cleansed with:  Saline   Amount of cleaning:  Standard   Irrigation solution:  Sterile saline   Irrigation method:  Pressure wash Skin repair:    Repair  method:  Sutures   Suture size:  4-0   Suture material:  Nylon   Suture  technique:  Simple interrupted and horizontal mattress   Number of sutures:  5 Approximation:    Approximation:  Close Repair type:    Repair type:  Simple Post-procedure details:    Dressing:  Non-adherent dressing   Procedure completion:  Tolerated well, no immediate complications   (including critical care time)  Medical Decision Making / ED Course   This patient presents to the ED for concern of hand laceration, this involves an extensive number of treatment options, and is a complaint that carries with it a high risk of complications and morbidity.  The differential diagnosis includes laceration, fracture, ligamentous injury, nerve injury  MDM: Patient seen emergency room for evaluation of a palm laceration.  Physical exam with a 3 cm laceration to the thenar eminence on the left.  Patient has an anaphylactic allergy to lidocaine and thus I spoke with the pharmacist here at [facility] who states that patient's current best injectable option is likely chloroprocaine.  Patient anesthetized appropriately with no anaphylactic events.  Laceration repaired with nylon sutures.  Tetanus updated.  Patient will return to the ER in 1 week for suture removal.  Patient then discharged with outpatient follow-up.   Additional history obtained: -Additional history obtained from wife -External records from outside source obtained and reviewed including: Chart review including previous notes, labs, imaging, consultation notes     Medicines ordered and prescription drug management: Meds ordered this encounter  Medications   Tdap (BOOSTRIX) injection 0.5 mL   DISCONTD: tetracaine 1 % injection 1 mL   chloroprocaine (NESACAINE-MPF) 2 % injection 10 mL    -I have reviewed the patients home medicines and have made adjustments as needed  Critical interventions none    Cardiac Monitoring: The patient was maintained on  a cardiac monitor.  I personally viewed and interpreted the cardiac monitored which showed an underlying rhythm of: NSr  Social Determinants of Health:  Factors impacting patients care include: none   Reevaluation: After the interventions noted above, I reevaluated the patient and found that they have :improved  Co morbidities that complicate the patient evaluation  Past Medical History:  Diagnosis Date   Abnormal CT of the chest 01/23/2014   BPH (benign prostatic hyperplasia) 05/31/2018   Complication of anesthesia    hard to wake up    Diabetes mellitus without complication (HCC)    Elevated PSA    Erectile dysfunction    Gastroesophageal reflux    H/O sarcoidosis    Headache    7-8 months from gabapentin and naproxen   Hypertension    Pneumonia of upper lobe of lung 12/1213   Sleep apnea       Dispostion: I considered admission for this patient, and with laceration repaired he does not meet inpatient criteria for admission he is safe for discharge with outpatient follow-up     Final Clinical Impression(s) / ED Diagnoses Final diagnoses:  Laceration of left hand, foreign body presence unspecified, initial encounter     @PCDICTATION @    Glendora Score, MD 07/05/22 1910

## 2022-07-18 ENCOUNTER — Telehealth: Payer: Self-pay | Admitting: *Deleted

## 2022-07-18 NOTE — Telephone Encounter (Signed)
   Pre-operative Risk Assessment    Patient Name: Thomas Malone  DOB: Jun 17, 1950 MRN: [id number]      Request for Surgical Clearance    Procedure:   Right ulnar nerve decompression with transposition, possible sub muscular transposition  Date of Surgery:  Clearance 08/28/22                                 Surgeon:  Not Indicated Surgeon's Group or Practice Name:  [facility] Phone number:  [phone] Fax number:  [phone]   Type of Clearance Requested:   - Medical  - Pharmacy:  Hold Aspirin Not Indicated.   Type of Anesthesia:   Axillary Block   Additional requests/questions:    Signed, Emmit Pomfret   07/18/2022, 2:47 PM

## 2022-07-18 NOTE — Telephone Encounter (Signed)
   Primary Cardiologist: Meriam Sprague, MD  Called patient to ensure no changes since previously cleared on 05/28/22. He is not having any concerning cardiac symptoms. Surgery was rescheduled because of nerve testing that was not completed in time for previous surgery date.   Chart reviewed as part of pre-operative protocol coverage. Given past medical history and time since last visit, based on ACC/AHA guidelines, Thomas Malone would be at acceptable risk for the planned procedure without further cardiovascular testing.   Patient was advised that if he develops new symptoms prior to surgery to contact our office to arrange a follow-up appointment.  He verbalized understanding.  Per office protocol, he may hold aspirin for 5-7 days prior to procedure and should resume as soon as hemodynamically stable postoperatively.  I will route this recommendation to the requesting party via Epic fax function and remove from pre-op pool.  Please call with questions.  Levi Aland, NP-C  07/18/2022, 3:46 PM [address], [address] Office [phone] Fax [phone]

## 2022-07-21 ENCOUNTER — Other Ambulatory Visit: Payer: Self-pay | Admitting: Orthopedic Surgery

## 2022-08-06 ENCOUNTER — Ambulatory Visit (INDEPENDENT_AMBULATORY_CARE_PROVIDER_SITE_OTHER): Payer: PPO | Admitting: Internal Medicine

## 2022-08-06 ENCOUNTER — Encounter: Payer: Self-pay | Admitting: Internal Medicine

## 2022-08-06 VITALS — BP 125/79 | HR 68 | Ht 70.0 in | Wt 247.6 lb

## 2022-08-06 DIAGNOSIS — K219 Gastro-esophageal reflux disease without esophagitis: Secondary | ICD-10-CM

## 2022-08-06 DIAGNOSIS — N1831 Chronic kidney disease, stage 3a: Secondary | ICD-10-CM

## 2022-08-06 DIAGNOSIS — E1169 Type 2 diabetes mellitus with other specified complication: Secondary | ICD-10-CM

## 2022-08-06 DIAGNOSIS — N529 Male erectile dysfunction, unspecified: Secondary | ICD-10-CM

## 2022-08-06 DIAGNOSIS — I10 Essential (primary) hypertension: Secondary | ICD-10-CM

## 2022-08-06 DIAGNOSIS — Z1159 Encounter for screening for other viral diseases: Secondary | ICD-10-CM

## 2022-08-06 DIAGNOSIS — G4733 Obstructive sleep apnea (adult) (pediatric): Secondary | ICD-10-CM | POA: Diagnosis not present

## 2022-08-06 DIAGNOSIS — N138 Other obstructive and reflux uropathy: Secondary | ICD-10-CM

## 2022-08-06 DIAGNOSIS — N401 Enlarged prostate with lower urinary tract symptoms: Secondary | ICD-10-CM | POA: Diagnosis not present

## 2022-08-06 DIAGNOSIS — R600 Localized edema: Secondary | ICD-10-CM | POA: Insufficient documentation

## 2022-08-06 DIAGNOSIS — E785 Hyperlipidemia, unspecified: Secondary | ICD-10-CM | POA: Insufficient documentation

## 2022-08-06 DIAGNOSIS — E1122 Type 2 diabetes mellitus with diabetic chronic kidney disease: Secondary | ICD-10-CM | POA: Insufficient documentation

## 2022-08-06 DIAGNOSIS — R5383 Other fatigue: Secondary | ICD-10-CM

## 2022-08-06 MED ORDER — FREESTYLE LANCETS MISC: 1.0000 | Freq: Two times a day (BID) | 3 refills | Status: DC

## 2022-08-06 MED ORDER — FREESTYLE LITE TEST VI STRP: ORAL_STRIP | 3 refills | Status: DC

## 2022-08-06 NOTE — Assessment & Plan Note (Signed)
Followed by urology.  Symptoms are adequately controlled with Proscar and alfuzosin.

## 2022-08-06 NOTE — Patient Instructions (Signed)
It was a pleasure to see you today.  Thank you for giving [place] the opportunity to be involved in your care.  Below is a brief recap of your visit and next steps.  We will plan to see you again in 6 weeks.  Summary You have established care today We will check basic labs and request records from Dr. Michelle Nasuti office Follow up in 6 weeks

## 2022-08-06 NOTE — Assessment & Plan Note (Addendum)
Followed by nephrology (Dr. Wolfgang Phoenix).  Reports that he is scheduled for follow-up later this month.  Repeat labs have been ordered today.

## 2022-08-06 NOTE — Assessment & Plan Note (Signed)
He endorses a history of chronic fatigue.  Reports that he was previously prescribed a medicine for treatment of fatigue but cannot recall the name.  Symptoms are largely unchanged today.  Baseline labs have been ordered.  Will further discuss at follow-up in 6 weeks.

## 2022-08-06 NOTE — Assessment & Plan Note (Signed)
He is currently prescribed pravastatin 10 mg daily.  A repeat lipid panel has been ordered today.

## 2022-08-06 NOTE — Assessment & Plan Note (Signed)
He is prescribed tadalafil 5 mg daily.

## 2022-08-06 NOTE — Progress Notes (Signed)
New Patient Office Visit  Subjective    Patient ID: Thomas Malone, male    DOB: 1950-05-11  Age: 72 y.o. MRN: [id number]  CC:  Chief Complaint  Patient presents with   Establish Care    HPI Thomas Malone presents to establish care.  He is a 72 year old male with a past medical history significant for HTN, T2DM, HLD, CKD 3A, OSA, GERD, and BPH.  Previously followed by Dr. Sudie Bailey.  Thomas Malone reports feeling fairly well today.  He endorses bilateral lower extremity edema that has been present for 2 years as well as chronic fatigue.  States that he was previously prescribed medication for treatment of fatigue, but cannot recall the name.  He is accompanied by his wife today. Thomas Malone denies tobacco, alcohol, and illicit drug use.  His family medical history is significant for CAD, lymphoma, HTN, and DM.  Acute concerns, chronic medical conditions, and outstanding preventative care items discussed today are individually addressing/below.   Outpatient Encounter Medications as of 08/06/2022  Medication Sig   alfuzosin (UROXATRAL) 10 MG 24 hr tablet Take 1 tablet (10 mg total) by mouth daily with breakfast.   amLODipine (NORVASC) 10 MG tablet Take 5 mg by mouth daily.   aspirin EC 81 MG tablet Take 81 mg by mouth daily.   chlorthalidone (HYGROTON) 25 MG tablet Take 25 mg by mouth daily.   finasteride (PROSCAR) 5 MG tablet Take 1 tablet (5 mg total) by mouth daily.   glipiZIDE (GLUCOTROL) 5 MG tablet Take 5 mg by mouth every morning.   Glucosamine-Chondroitin (COSAMIN DS PO) Take 1 tablet by mouth daily.   irbesartan (AVAPRO) 150 MG tablet TAKE ONE TABLET (150MG  TOTAL) BY MOUTH DAILY   Multiple Vitamin (MULTIVITAMIN WITH MINERALS) TABS tablet Take 1 tablet by mouth daily.   omeprazole (PRILOSEC) 40 MG capsule Take 40 mg by mouth every other day.    pravastatin (PRAVACHOL) 10 MG tablet Take 10 mg by mouth at bedtime.   tadalafil (CIALIS) 5 MG tablet Take 1 tablet (5 mg total) by mouth daily.    [DISCONTINUED] FREESTYLE LITE test strip SMARTSIG:Via Meter   [DISCONTINUED] Lancets (FREESTYLE) lancets 1 each 2 (two) times daily.   FREESTYLE LITE test strip SMARTSIG:Via Meter   Lancets (FREESTYLE) lancets 1 each by Other route 2 (two) times daily.   [DISCONTINUED] hydrochlorothiazide (HYDRODIURIL) 25 MG tablet Take 0.5 tablets (12.5 mg total) by mouth daily.   [DISCONTINUED] mirabegron ER (MYRBETRIQ) 25 MG TB24 tablet Take 1 tablet (25 mg total) by mouth daily.   [DISCONTINUED] pravastatin (PRAVACHOL) 10 MG tablet Take 10 mg by mouth daily.   No facility-administered encounter medications on file as of 08/06/2022.    Past Medical History:  Diagnosis Date   Abnormal CT of the chest 01/23/2014   BPH (benign prostatic hyperplasia) 05/31/2018   Complication of anesthesia    hard to wake up    Diabetes mellitus without complication (HCC)    Elevated PSA    Erectile dysfunction    Gastroesophageal reflux    H/O sarcoidosis    Headache    7-8 months from gabapentin and naproxen   Hypertension    Pneumonia of upper lobe of lung 12/1213   Sleep apnea     Past Surgical History:  Procedure Laterality Date   ANTERIOR CERVICAL DECOMP/DISCECTOMY FUSION N/A 05/28/2018   CATARACT EXTRACTION W/PHACO Left 04/11/2019   Procedure: CATARACT EXTRACTION PHACO AND INTRAOCULAR LENS PLACEMENT (IOC) (CDE:5.80);  Surgeon: Fabio Pierce, MD;  Location: [facility];  Service: Ophthalmology;  Laterality: Left;   CATARACT EXTRACTION W/PHACO Right 06/03/2019   Procedure: CATARACT EXTRACTION PHACO AND INTRAOCULAR LENS PLACEMENT RIGHT EYE;  Surgeon: Fabio Pierce, MD;  Location: [facility];  Service: Ophthalmology;  Laterality: Right;  CDE: 9.82   COLONOSCOPY     COLONOSCOPY N/A 06/23/2014   Procedure: COLONOSCOPY;  Surgeon: Franky Macho Md, MD;  Location: [facility];  Service: Gastroenterology;  Laterality: N/A;   ELBOW SURGERY Right    2021   EYE SURGERY Bilateral    lasic   ORTHOPEDIC SURGERY Left     heel surgery   PROSTATE BIOPSY     SHOULDER ARTHROSCOPY Right    repair of tendons and ligaments   TONSILLECTOMY     WRIST SURGERY     2021    Family History  Problem Relation Age of Onset   Lymphoma Mother    Heart disease Father    Hyperlipidemia Sister    Diabetes Sister    Heart disease Sister    Breast cancer Sister    Hyperlipidemia Brother    Diabetes Brother     Social History   Socioeconomic History   Marital status: Married    Spouse name: Not on file   Number of children: 2   Years of education: Not on file   Highest education level: Not on file  Occupational History   Occupation: concrete finisher  Tobacco Use   Smoking status: Former    Types: Cigars   Smokeless tobacco: Never   Tobacco comments:    and pipe-none since 2010  Vaping Use   Vaping Use: Never used  Substance and Sexual Activity   Alcohol use: No   Drug use: No   Sexual activity: Yes    Birth control/protection: None  Other Topics Concern   Not on file  Social History Narrative   Not on file   Social Determinants of Health   Financial Resource Strain: Not on file  Food Insecurity: Not on file  Transportation Needs: Not on file  Physical Activity: Not on file  Stress: Not on file  Social Connections: Not on file  Intimate Partner Violence: Not on file    Review of Systems  Constitutional:  Positive for malaise/fatigue. Negative for chills and fever.  HENT:  Negative for sore throat.   Respiratory:  Negative for cough and shortness of breath.   Cardiovascular:  Positive for leg swelling. Negative for chest pain and palpitations.  Gastrointestinal:  Negative for abdominal pain, blood in stool, constipation, diarrhea, nausea and vomiting.  Genitourinary:  Negative for dysuria and hematuria.  Musculoskeletal:  Negative for myalgias.  Skin:  Negative for itching and rash.  Neurological:  Negative for dizziness and headaches.  Psychiatric/Behavioral:  Negative for depression and  suicidal ideas.         Objective    BP 125/79   Pulse 68   Ht 5\' 10"  (1.778 m)   Wt 247 lb 9.6 oz (112.3 kg)   SpO2 96%   BMI 35.53 kg/m   Physical Exam Vitals reviewed.  Constitutional:      General: He is not in acute distress.    Appearance: Normal appearance. He is obese. He is not ill-appearing.  HENT:     Head: Normocephalic and atraumatic.     Right Ear: External ear normal.     Left Ear: External ear normal.     Nose: Nose normal. No congestion or rhinorrhea.  Mouth/Throat:     Mouth: Mucous membranes are moist.     Pharynx: Oropharynx is clear.  Eyes:     General: No scleral icterus.    Extraocular Movements: Extraocular movements intact.     Conjunctiva/sclera: Conjunctivae normal.     Pupils: Pupils are equal, round, and reactive to light.  Cardiovascular:     Rate and Rhythm: Normal rate and regular rhythm.     Pulses: Normal pulses.     Heart sounds: Normal heart sounds. No murmur heard. Pulmonary:     Effort: Pulmonary effort is normal.     Breath sounds: Normal breath sounds. No wheezing, rhonchi or rales.  Abdominal:     General: Abdomen is flat. Bowel sounds are normal. There is no distension.     Palpations: Abdomen is soft.     Tenderness: There is no abdominal tenderness.  Musculoskeletal:        General: Swelling present. No deformity. Normal range of motion.     Cervical back: Normal range of motion.     Right lower leg: Edema present.     Left lower leg: Edema present.  Skin:    General: Skin is warm and dry.     Capillary Refill: Capillary refill takes less than 2 seconds.  Neurological:     General: No focal deficit present.     Mental Status: He is alert and oriented to person, place, and time.     Motor: No weakness.  Psychiatric:        Mood and Affect: Mood normal.        Behavior: Behavior normal.        Thought Content: Thought content normal.    Assessment & Plan:   Problem List Items Addressed This Visit        Essential hypertension - Primary (Chronic)    Adequately controlled on current antihypertensive regimen of chlorthalidone 25 mg daily, amlodipine 5 mg daily, and irbesartan 150 mg daily.  Consider trial off of amlodipine in the future due to persistent by lower extremity edema.  Could increase irbesartan if necessary.      OSA on CPAP (Chronic)    He endorses nightly appliance with CPAP      GERD (gastroesophageal reflux disease)    Symptoms are adequately controlled with omeprazole 40 mg taken every other day.      Type 2 diabetes mellitus with diabetic chronic kidney disease (HCC)    History of T2DM.  Currently prescribed blips at 5 mg daily.  Reports he was previously on metformin but this was discontinued in the setting of CKD. -Repeat A1c and urine microalbumin/creatinine ratio ordered today -Currently on statin and ARB therapy.      Hyperlipidemia associated with type 2 diabetes mellitus (HCC)    He is currently prescribed pravastatin 10 mg daily.  A repeat lipid panel has been ordered today.      Chronic kidney disease, stage 3a (HCC)    Followed by nephrology (Dr. Wolfgang Phoenix).  Reports that he is scheduled for follow-up later this month.  Repeat labs have been ordered today.      BPH (benign prostatic hyperplasia)    Followed by urology.  Symptoms are adequately controlled with Proscar and alfuzosin.      Erectile dysfunction    He is prescribed tadalafil 5 mg daily.      Fatigue    He endorses a history of chronic fatigue.  Reports that he was previously prescribed a medicine for treatment of  fatigue but cannot recall the name.  Symptoms are largely unchanged today.  Baseline labs have been ordered.  Will further discuss at follow-up in 6 weeks.      Bilateral lower extremity edema    Present x 2 years.  Reports that edema is worse at the end of the day and resolves by morning.  He reports that more recently swelling has become painful at the end of the day and does not  fully resolve by the morning. -We reviewed that he has multiple reasons to have lower extremity edema (CKD, venous insufficiency, medication side effect, etc.).  I have recommended conservative treatment measures for now, including use of compression stockings and leg elevation.  Baseline labs have been ordered today.  We will tentatively plan for follow-up in 6 weeks for lab review and reassessment.  Can consider a trial off of amlodipine to see if this improves his symptoms.      Return in about 6 weeks (around 09/17/2022) for lab review, HTN, BLE edema.   [name], MD

## 2022-08-06 NOTE — Assessment & Plan Note (Signed)
History of T2DM.  Currently prescribed blips at 5 mg daily.  Reports he was previously on metformin but this was discontinued in the setting of CKD. -Repeat A1c and urine microalbumin/creatinine ratio ordered today -Currently on statin and ARB therapy.

## 2022-08-06 NOTE — Assessment & Plan Note (Signed)
Present x 2 years.  Reports that edema is worse at the end of the day and resolves by morning.  He reports that more recently swelling has become painful at the end of the day and does not fully resolve by the morning. -We reviewed that he has multiple reasons to have lower extremity edema (CKD, venous insufficiency, medication side effect, etc.).  I have recommended conservative treatment measures for now, including use of compression stockings and leg elevation.  Baseline labs have been ordered today.  We will tentatively plan for follow-up in 6 weeks for lab review and reassessment.  Can consider a trial off of amlodipine to see if this improves his symptoms.

## 2022-08-06 NOTE — Assessment & Plan Note (Signed)
Adequately controlled on current antihypertensive regimen of chlorthalidone 25 mg daily, amlodipine 5 mg daily, and irbesartan 150 mg daily.  Consider trial off of amlodipine in the future due to persistent by lower extremity edema.  Could increase irbesartan if necessary.

## 2022-08-06 NOTE — Assessment & Plan Note (Signed)
Symptoms are adequately controlled with omeprazole 40 mg taken every other day.

## 2022-08-06 NOTE — Assessment & Plan Note (Signed)
He endorses nightly appliance with CPAP

## 2022-08-07 LAB — CBC WITH DIFFERENTIAL/PLATELET
Basophils Absolute: 0 10*3/uL (ref 0.0–0.2)
Eos: 5 %
Hemoglobin: 11.7 g/dL — ABNORMAL LOW (ref 13.0–17.7)
Immature Grans (Abs): 0 10*3/uL (ref 0.0–0.1)
Lymphocytes Absolute: 1.7 10*3/uL (ref 0.7–3.1)
MCV: 83 fL (ref 79–97)

## 2022-08-07 LAB — HCV AB W REFLEX TO QUANT PCR: HCV Ab: NONREACTIVE

## 2022-08-07 LAB — CMP14+EGFR
ALT: 10 IU/L (ref 0–44)
AST: 18 IU/L (ref 0–40)
Alkaline Phosphatase: 95 IU/L (ref 44–121)
BUN/Creatinine Ratio: 10 (ref 10–24)
Bilirubin Total: 0.6 mg/dL (ref 0.0–1.2)
CO2: 23 mmol/L (ref 20–29)
Creatinine, Ser: 2.27 mg/dL — ABNORMAL HIGH (ref 0.76–1.27)
Globulin, Total: 2.8 g/dL (ref 1.5–4.5)
Glucose: 128 mg/dL — ABNORMAL HIGH (ref 70–99)
Potassium: 3.9 mmol/L (ref 3.5–5.2)
Total Protein: 6.9 g/dL (ref 6.0–8.5)
eGFR: 30 mL/min/{1.73_m2} — ABNORMAL LOW (ref >59)

## 2022-08-07 LAB — LIPID PANEL
Chol/HDL Ratio: 3.8 ratio (ref 0.0–5.0)
LDL Chol Calc (NIH): 93 mg/dL (ref 0–99)
Triglycerides: 106 mg/dL (ref 0–149)
VLDL Cholesterol Cal: 20 mg/dL (ref 5–40)

## 2022-08-07 LAB — MICROALBUMIN / CREATININE URINE RATIO

## 2022-08-07 LAB — IRON,TIBC AND FERRITIN PANEL
Iron Saturation: 26 % (ref 15–55)
Total Iron Binding Capacity: 280 ug/dL (ref 250–450)

## 2022-08-09 LAB — IRON,TIBC AND FERRITIN PANEL
Ferritin: 111 ng/mL (ref 30–400)
Iron: 74 ug/dL (ref 38–169)
UIBC: 206 ug/dL (ref 111–343)

## 2022-08-09 LAB — CMP14+EGFR
Albumin: 4.1 g/dL (ref 3.8–4.8)
BUN: 22 mg/dL (ref 8–27)
Calcium: 9.5 mg/dL (ref 8.6–10.2)
Chloride: 105 mmol/L (ref 96–106)
Sodium: 141 mmol/L (ref 134–144)

## 2022-08-09 LAB — CBC WITH DIFFERENTIAL/PLATELET
Basos: 1 %
EOS (ABSOLUTE): 0.3 10*3/uL (ref 0.0–0.4)
Hematocrit: 35.8 % — ABNORMAL LOW (ref 37.5–51.0)
Immature Granulocytes: 0 %
Lymphs: 28 %
MCH: 27.1 pg (ref 26.6–33.0)
MCHC: 32.7 g/dL (ref 31.5–35.7)
Monocytes Absolute: 0.6 10*3/uL (ref 0.1–0.9)
Monocytes: 9 %
Neutrophils Absolute: 3.6 10*3/uL (ref 1.4–7.0)
Neutrophils: 57 %
Platelets: 214 10*3/uL (ref 150–450)
RBC: 4.31 x10E6/uL (ref 4.14–5.80)
RDW: 12.1 % (ref 11.6–15.4)
WBC: 6.3 10*3/uL (ref 3.4–10.8)

## 2022-08-09 LAB — LIPID PANEL
Cholesterol, Total: 153 mg/dL (ref 100–199)
HDL: 40 mg/dL (ref >39)

## 2022-08-09 LAB — HEMOGLOBIN A1C
Est. average glucose Bld gHb Est-mCnc: 171 mg/dL
Hgb A1c MFr Bld: 7.6 % — ABNORMAL HIGH (ref 4.8–5.6)

## 2022-08-09 LAB — B12 AND FOLATE PANEL
Folate: 8.1 ng/mL (ref >3.0)
Vitamin B-12: 824 pg/mL (ref 232–1245)

## 2022-08-09 LAB — TSH+FREE T4
Free T4: 1.26 ng/dL (ref 0.82–1.77)
TSH: 2.12 u[IU]/mL (ref 0.450–4.500)

## 2022-08-09 LAB — VITAMIN D 25 HYDROXY (VIT D DEFICIENCY, FRACTURES): Vit D, 25-Hydroxy: 37.5 ng/mL (ref 30.0–100.0)

## 2022-08-09 LAB — HCV INTERPRETATION

## 2022-08-20 ENCOUNTER — Other Ambulatory Visit: Payer: Self-pay

## 2022-08-20 ENCOUNTER — Encounter (HOSPITAL_BASED_OUTPATIENT_CLINIC_OR_DEPARTMENT_OTHER): Payer: Self-pay | Admitting: Orthopedic Surgery

## 2022-08-20 NOTE — Progress Notes (Signed)
Chart reviewed with Dr. Bradley Ferris due to elevated creatinine. Patient sees nephrologist. Per Dr. Bradley Ferris, patient is ok to have surgery here at [facility] without further testing.

## 2022-08-28 ENCOUNTER — Encounter (HOSPITAL_BASED_OUTPATIENT_CLINIC_OR_DEPARTMENT_OTHER)
Admission: RE | Disposition: A | Discharge status: Home / Self Care | Payer: Self-pay | Source: Home / Self Care | Attending: Orthopedic Surgery

## 2022-08-28 ENCOUNTER — Encounter (HOSPITAL_BASED_OUTPATIENT_CLINIC_OR_DEPARTMENT_OTHER): Payer: Self-pay | Admitting: Orthopedic Surgery

## 2022-08-28 ENCOUNTER — Ambulatory Visit (HOSPITAL_BASED_OUTPATIENT_CLINIC_OR_DEPARTMENT_OTHER): Payer: Worker's Compensation | Admitting: Anesthesiology

## 2022-08-28 ENCOUNTER — Other Ambulatory Visit: Payer: Self-pay

## 2022-08-28 ENCOUNTER — Ambulatory Visit (HOSPITAL_BASED_OUTPATIENT_CLINIC_OR_DEPARTMENT_OTHER)
Admission: RE | Admit: 2022-08-28 | Discharge: 2022-08-28 | Disposition: A | Discharge status: Home / Self Care | Payer: Worker's Compensation | Attending: Orthopedic Surgery | Admitting: Orthopedic Surgery

## 2022-08-28 DIAGNOSIS — E119 Type 2 diabetes mellitus without complications: Secondary | ICD-10-CM | POA: Insufficient documentation

## 2022-08-28 DIAGNOSIS — N1831 Chronic kidney disease, stage 3a: Secondary | ICD-10-CM

## 2022-08-28 DIAGNOSIS — Z79899 Other long term (current) drug therapy: Secondary | ICD-10-CM | POA: Diagnosis not present

## 2022-08-28 DIAGNOSIS — G4730 Sleep apnea, unspecified: Secondary | ICD-10-CM | POA: Diagnosis not present

## 2022-08-28 DIAGNOSIS — Z87891 Personal history of nicotine dependence: Secondary | ICD-10-CM

## 2022-08-28 DIAGNOSIS — G5621 Lesion of ulnar nerve, right upper limb: Secondary | ICD-10-CM | POA: Insufficient documentation

## 2022-08-28 DIAGNOSIS — I10 Essential (primary) hypertension: Secondary | ICD-10-CM | POA: Insufficient documentation

## 2022-08-28 DIAGNOSIS — Z01818 Encounter for other preprocedural examination: Secondary | ICD-10-CM

## 2022-08-28 DIAGNOSIS — I129 Hypertensive chronic kidney disease with stage 1 through stage 4 chronic kidney disease, or unspecified chronic kidney disease: Secondary | ICD-10-CM

## 2022-08-28 DIAGNOSIS — K219 Gastro-esophageal reflux disease without esophagitis: Secondary | ICD-10-CM | POA: Insufficient documentation

## 2022-08-28 DIAGNOSIS — Z7984 Long term (current) use of oral hypoglycemic drugs: Secondary | ICD-10-CM | POA: Insufficient documentation

## 2022-08-28 HISTORY — PX: ULNAR NERVE TRANSPOSITION: SHX2595

## 2022-08-28 LAB — GLUCOSE, CAPILLARY
Glucose-Capillary: 143 mg/dL — ABNORMAL HIGH (ref 70–99)
Glucose-Capillary: 173 mg/dL — ABNORMAL HIGH (ref 70–99)

## 2022-08-28 SURGERY — ULNAR NERVE DECOMPRESSION/TRANSPOSITION
Anesthesia: General | Site: Arm Upper | Laterality: Right

## 2022-08-28 MED ORDER — ACETAMINOPHEN 10 MG/ML IV SOLN
INTRAVENOUS | Status: DC | PRN
  Administered 2022-08-28: 1000 mg via INTRAVENOUS

## 2022-08-28 MED ORDER — OXYCODONE HCL 5 MG/5ML PO SOLN: 5.0000 mg | Freq: Once | ORAL | Status: DC | PRN

## 2022-08-28 MED ORDER — PHENYLEPHRINE HCL (PRESSORS) 10 MG/ML IV SOLN
INTRAVENOUS | Status: DC | PRN
  Administered 2022-08-28 (×3): 80 ug via INTRAVENOUS
  Administered 2022-08-28: 160 ug via INTRAVENOUS

## 2022-08-28 MED ORDER — CEFAZOLIN SODIUM-DEXTROSE 2-4 GM/100ML-% IV SOLN
INTRAVENOUS | Status: AC
  Filled 2022-08-28: qty 100

## 2022-08-28 MED ORDER — FENTANYL CITRATE (PF) 100 MCG/2ML IJ SOLN
INTRAMUSCULAR | Status: AC
  Filled 2022-08-28: qty 2

## 2022-08-28 MED ORDER — PHENYLEPHRINE HCL (PRESSORS) 10 MG/ML IV SOLN
INTRAVENOUS | Status: AC
  Filled 2022-08-28: qty 1

## 2022-08-28 MED ORDER — OXYCODONE HCL 5 MG PO TABS: 5.0000 mg | ORAL_TABLET | Freq: Once | ORAL | Status: DC | PRN

## 2022-08-28 MED ORDER — ONDANSETRON HCL 4 MG/2ML IJ SOLN: 4.0000 mg | Freq: Once | INTRAMUSCULAR | Status: DC | PRN

## 2022-08-28 MED ORDER — PROPOFOL 10 MG/ML IV BOLUS
INTRAVENOUS | Status: AC
  Filled 2022-08-28: qty 20

## 2022-08-28 MED ORDER — CEFAZOLIN SODIUM-DEXTROSE 2-4 GM/100ML-% IV SOLN
2.0000 g | INTRAVENOUS | Status: AC
  Administered 2022-08-28: 2 g via INTRAVENOUS

## 2022-08-28 MED ORDER — OXYCODONE-ACETAMINOPHEN 5-325 MG PO TABS: ORAL_TABLET | ORAL | 0 refills | Status: DC

## 2022-08-28 MED ORDER — PHENYLEPHRINE 80 MCG/ML (10ML) SYRINGE FOR IV PUSH (FOR BLOOD PRESSURE SUPPORT)
PREFILLED_SYRINGE | INTRAVENOUS | Status: AC
  Filled 2022-08-28: qty 10

## 2022-08-28 MED ORDER — MIDAZOLAM HCL 2 MG/2ML IJ SOLN
INTRAMUSCULAR | Status: AC
  Filled 2022-08-28: qty 2

## 2022-08-28 MED ORDER — ACETAMINOPHEN 10 MG/ML IV SOLN
INTRAVENOUS | Status: AC
  Filled 2022-08-28: qty 100

## 2022-08-28 MED ORDER — FENTANYL CITRATE (PF) 100 MCG/2ML IJ SOLN: 25.0000 ug | INTRAMUSCULAR | Status: DC | PRN

## 2022-08-28 MED ORDER — DEXAMETHASONE SODIUM PHOSPHATE 10 MG/ML IJ SOLN
INTRAMUSCULAR | Status: AC
  Filled 2022-08-28: qty 1

## 2022-08-28 MED ORDER — ONDANSETRON HCL 4 MG/2ML IJ SOLN
INTRAMUSCULAR | Status: DC | PRN
  Administered 2022-08-28: 4 mg via INTRAVENOUS

## 2022-08-28 MED ORDER — ACETAMINOPHEN 10 MG/ML IV SOLN: 1000.0000 mg | Freq: Once | INTRAVENOUS | Status: DC | PRN

## 2022-08-28 MED ORDER — LIDOCAINE 2% (20 MG/ML) 5 ML SYRINGE
INTRAMUSCULAR | Status: AC
  Filled 2022-08-28: qty 5

## 2022-08-28 MED ORDER — PROPOFOL 10 MG/ML IV BOLUS
INTRAVENOUS | Status: DC | PRN
  Administered 2022-08-28: 200 mg via INTRAVENOUS

## 2022-08-28 MED ORDER — LACTATED RINGERS IV SOLN: INTRAVENOUS | Status: DC

## 2022-08-28 MED ORDER — BUPIVACAINE HCL (PF) 0.25 % IJ SOLN
INTRAMUSCULAR | Status: AC
  Filled 2022-08-28: qty 30

## 2022-08-28 MED ORDER — DEXAMETHASONE SODIUM PHOSPHATE 4 MG/ML IJ SOLN
INTRAMUSCULAR | Status: DC | PRN
  Administered 2022-08-28: 8 mg via INTRAVENOUS

## 2022-08-28 MED ORDER — EPHEDRINE SULFATE (PRESSORS) 50 MG/ML IJ SOLN
INTRAMUSCULAR | Status: DC | PRN
  Administered 2022-08-28: 10 mg via INTRAVENOUS

## 2022-08-28 MED ORDER — FENTANYL CITRATE (PF) 100 MCG/2ML IJ SOLN
INTRAMUSCULAR | Status: DC | PRN
  Administered 2022-08-28 (×2): 25 ug via INTRAVENOUS
  Administered 2022-08-28: 100 ug via INTRAVENOUS
  Administered 2022-08-28 (×2): 25 ug via INTRAVENOUS

## 2022-08-28 MED ORDER — PHENYLEPHRINE HCL-NACL 20-0.9 MG/250ML-% IV SOLN
INTRAVENOUS | Status: DC | PRN
  Administered 2022-08-28: 40 ug/min via INTRAVENOUS

## 2022-08-28 SURGICAL SUPPLY — 61 items
APL PRP STRL LF DISP 70% ISPRP (MISCELLANEOUS) ×1
BLADE MINI RND TIP GREEN BEAV (BLADE) IMPLANT
BLADE SURG 15 STRL LF DISP TIS (BLADE) ×4 IMPLANT
BLADE SURG 15 STRL SS (BLADE) ×2
BNDG CMPR 5X3 KNIT ELC UNQ LF (GAUZE/BANDAGES/DRESSINGS) ×2
BNDG CMPR 5X4 KNIT ELC UNQ LF (GAUZE/BANDAGES/DRESSINGS) ×2
BNDG CMPR 9X4 STRL LF SNTH (GAUZE/BANDAGES/DRESSINGS) ×1
BNDG ELASTIC 3INX 5YD STR LF (GAUZE/BANDAGES/DRESSINGS) ×4 IMPLANT
BNDG ELASTIC 4INX 5YD STR LF (GAUZE/BANDAGES/DRESSINGS) ×2 IMPLANT
BNDG ESMARK 4X9 LF (GAUZE/BANDAGES/DRESSINGS) ×2 IMPLANT
BNDG GAUZE DERMACEA FLUFF 4 (GAUZE/BANDAGES/DRESSINGS) ×2 IMPLANT
BNDG GZE DERMACEA 4 6PLY (GAUZE/BANDAGES/DRESSINGS) ×1
CHLORAPREP W/TINT 26 (MISCELLANEOUS) ×2 IMPLANT
CLIP TI MEDIUM 6 (CLIP) IMPLANT
CORD BIPOLAR FORCEPS 12FT (ELECTRODE) ×2 IMPLANT
COVER BACK TABLE 60X90IN (DRAPES) ×2 IMPLANT
COVER MAYO STAND STRL (DRAPES) ×2 IMPLANT
CUFF TOURN SGL QUICK 18X3 (MISCELLANEOUS) ×2 IMPLANT
CUFF TOURN SGL QUICK 18X4 (TOURNIQUET CUFF) IMPLANT
DRAIN JP 10F RND SILICONE (MISCELLANEOUS) IMPLANT
DRAPE EXTREMITY T 121X128X90 (DISPOSABLE) ×2 IMPLANT
DRAPE SURG 17X23 STRL (DRAPES) ×2 IMPLANT
EVACUATOR SILICONE 100CC (DRAIN) IMPLANT
FACESHIELD WRAPAROUND (MASK) ×1 IMPLANT
FACESHIELD WRAPAROUND OR TEAM (MASK) IMPLANT
GAUZE 4X4 16PLY ~~LOC~~+RFID DBL (SPONGE) IMPLANT
GAUZE PAD ABD 8X10 STRL (GAUZE/BANDAGES/DRESSINGS) ×2 IMPLANT
GAUZE SPONGE 4X4 12PLY STRL (GAUZE/BANDAGES/DRESSINGS) ×2 IMPLANT
GAUZE XEROFORM 1X8 LF (GAUZE/BANDAGES/DRESSINGS) ×2 IMPLANT
GLOVE BIO SURGEON STRL SZ7.5 (GLOVE) ×2 IMPLANT
GLOVE BIOGEL PI IND STRL 8 (GLOVE) ×2 IMPLANT
GLOVE BIOGEL PI IND STRL 8.5 (GLOVE) ×2 IMPLANT
GLOVE SURG ORTHO 8.0 STRL STRW (GLOVE) ×2 IMPLANT
GOWN STRL REUS W/ TWL LRG LVL3 (GOWN DISPOSABLE) ×2 IMPLANT
GOWN STRL REUS W/TWL LRG LVL3 (GOWN DISPOSABLE) ×1
GOWN STRL REUS W/TWL XL LVL3 (GOWN DISPOSABLE) ×4 IMPLANT
NDL HYPO 25X1 1.5 SAFETY (NEEDLE) IMPLANT
NEEDLE HYPO 25X1 1.5 SAFETY (NEEDLE) IMPLANT
NS IRRIG 1000ML POUR BTL (IV SOLUTION) ×2 IMPLANT
PACK BASIN DAY SURGERY FS (CUSTOM PROCEDURE TRAY) ×2 IMPLANT
PAD CAST 3X4 CTTN HI CHSV (CAST SUPPLIES) ×2 IMPLANT
PAD CAST 4YDX4 CTTN HI CHSV (CAST SUPPLIES) ×2 IMPLANT
PADDING CAST ABS COTTON 4X4 ST (CAST SUPPLIES) ×2 IMPLANT
PADDING CAST COTTON 3X4 STRL (CAST SUPPLIES) ×1
PADDING CAST COTTON 4X4 STRL (CAST SUPPLIES) ×1
SLEEVE SCD COMPRESS KNEE MED (STOCKING) ×2 IMPLANT
SLING ARM FOAM STRAP XLG (SOFTGOODS) IMPLANT
SPIKE FLUID TRANSFER (MISCELLANEOUS) IMPLANT
SPLINT PLASTER CAST FAST 5X30 (CAST SUPPLIES) IMPLANT
SPLINT PLASTER CAST XFAST 3X15 (CAST SUPPLIES) IMPLANT
STAPLER VISISTAT 35W (STAPLE) IMPLANT
STOCKINETTE 4X48 STRL (DRAPES) ×2 IMPLANT
SUT ETHIBOND 3-0 V-5 (SUTURE) IMPLANT
SUT ETHILON 4 0 PS 2 18 (SUTURE) ×2 IMPLANT
SUT VIC AB 2-0 SH 27 (SUTURE) ×1
SUT VIC AB 2-0 SH 27XBRD (SUTURE) ×2 IMPLANT
SUT VIC AB 4-0 PS2 18 (SUTURE) IMPLANT
SYR BULB EAR ULCER 3OZ GRN STR (SYRINGE) ×2 IMPLANT
SYR CONTROL 10ML LL (SYRINGE) IMPLANT
TOWEL GREEN STERILE FF (TOWEL DISPOSABLE) ×2 IMPLANT
UNDERPAD 30X36 HEAVY ABSORB (UNDERPADS AND DIAPERS) ×2 IMPLANT

## 2022-08-28 NOTE — H&P (Signed)
Thomas Malone is an 72 y.o. male.   Chief Complaint: ulnar neuropathy HPI: 72 yo male s/p ulnar nerve transposition at right elbow.  Continued numbness and tingling of ring and small fingers that is bothersome to him.  Nerve conduction studies and ultrasound consistent with continued ulnar neuropathy at elbow.  He wishes to proceed with submuscular transposition of right ulnar nerve.  Allergies:  Allergies  Allergen Reactions   Diclofenac Anaphylaxis   Diclofenac Sodium Anaphylaxis    Topical gel   Levaquin [Levofloxacin In D5w] Anaphylaxis   Lidocaine Anaphylaxis    Past Medical History:  Diagnosis Date   Abnormal CT of the chest 01/23/2014   BPH (benign prostatic hyperplasia) 05/31/2018   Complication of anesthesia    hard to wake up    Diabetes mellitus without complication (HCC)    Elevated PSA    Erectile dysfunction    Gastroesophageal reflux    H/O sarcoidosis    Headache    7-8 months from gabapentin and naproxen   Hypertension    Pneumonia of upper lobe of lung 12/1213   Sleep apnea     Past Surgical History:  Procedure Laterality Date   ANTERIOR CERVICAL DECOMP/DISCECTOMY FUSION N/A 05/28/2018   CATARACT EXTRACTION W/PHACO Left 04/11/2019   Procedure: CATARACT EXTRACTION PHACO AND INTRAOCULAR LENS PLACEMENT (IOC) (CDE:5.80);  Surgeon: Fabio Pierce, MD;  Location: [facility];  Service: Ophthalmology;  Laterality: Left;   CATARACT EXTRACTION W/PHACO Right 06/03/2019   Procedure: CATARACT EXTRACTION PHACO AND INTRAOCULAR LENS PLACEMENT RIGHT EYE;  Surgeon: Fabio Pierce, MD;  Location: [facility];  Service: Ophthalmology;  Laterality: Right;  CDE: 9.82   COLONOSCOPY     COLONOSCOPY N/A 06/23/2014   Procedure: COLONOSCOPY;  Surgeon: Franky Macho Md, MD;  Location: [facility];  Service: Gastroenterology;  Laterality: N/A;   ELBOW SURGERY Right    2021   EYE SURGERY Bilateral    lasic   ORTHOPEDIC SURGERY Left    heel surgery   PROSTATE BIOPSY     SHOULDER  ARTHROSCOPY Right    repair of tendons and ligaments   TONSILLECTOMY     WRIST SURGERY     2021    Family History: Family History  Problem Relation Age of Onset   Lymphoma Mother    Heart disease Father    Hyperlipidemia Sister    Diabetes Sister    Heart disease Sister    Breast cancer Sister    Hyperlipidemia Brother    Diabetes Brother     Social History:   reports that he has quit smoking. His smoking use included cigars. He has never used smokeless tobacco. He reports that he does not drink alcohol and does not use drugs.  Medications: Medications Prior to Admission  Medication Sig Dispense Refill   alfuzosin (UROXATRAL) 10 MG 24 hr tablet Take 1 tablet (10 mg total) by mouth daily with breakfast. 90 tablet 3   amLODipine (NORVASC) 10 MG tablet Take 5 mg by mouth daily.     aspirin EC 81 MG tablet Take 81 mg by mouth daily.     chlorthalidone (HYGROTON) 25 MG tablet Take 25 mg by mouth daily.     finasteride (PROSCAR) 5 MG tablet Take 1 tablet (5 mg total) by mouth daily. 90 tablet 3   glipiZIDE (GLUCOTROL) 5 MG tablet Take 5 mg by mouth every morning.     Glucosamine-Chondroitin (COSAMIN DS PO) Take 1 tablet by mouth daily.     irbesartan (AVAPRO) 150  MG tablet TAKE ONE TABLET (150MG  TOTAL) BY MOUTH DAILY 30 tablet 0   Multiple Vitamin (MULTIVITAMIN WITH MINERALS) TABS tablet Take 1 tablet by mouth daily.     omeprazole (PRILOSEC) 40 MG capsule Take 40 mg by mouth every other day.      pravastatin (PRAVACHOL) 10 MG tablet Take 10 mg by mouth at bedtime.     FREESTYLE LITE test strip SMARTSIG:Via Meter 100 each 3   Lancets (FREESTYLE) lancets 1 each by Other route 2 (two) times daily. 100 each 3   tadalafil (CIALIS) 5 MG tablet Take 1 tablet (5 mg total) by mouth daily. 10 tablet 3    Results for orders placed or performed during the hospital encounter of 08/28/22 (from the past 48 hour(s))  Glucose, capillary     Status: Abnormal   Collection Time: 08/28/22  7:41 AM   Result Value Ref Range   Glucose-Capillary 143 (H) 70 - 99 mg/dL    Comment: Glucose reference range applies only to samples taken after fasting for at least 8 hours.   Comment 1 Notify RN    Comment 2 Document in Chart     No results found.    Blood pressure 134/87, pulse 64, temperature 98.2 F (36.8 C), temperature source Oral, resp. rate 15, height 5\' 10"  (1.778 m), weight 112 kg, SpO2 100%.  General appearance: alert, cooperative, and appears stated age Head: Normocephalic, without obvious abnormality, atraumatic Neck: supple, symmetrical, trachea midline Extremities: Intact sensation and capillary refill all digits except ring and small on right hand with decreased sensation.  +epl/fpl/io.  No wounds.  Pulses: 2+ and symmetric Skin: Skin color, texture, turgor normal. 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  Non operative and operative treatment options have been discussed with the patient and patient wishes to proceed with operative treatment. Risks, benefits, and alternatives of surgery have been discussed and the patient agrees with the plan of care.   Betha Loa 08/28/2022, 8:29 AM

## 2022-08-28 NOTE — Transfer of Care (Signed)
Immediate Anesthesia Transfer of Care Note  Patient: Thomas Malone  Procedure(s) Performed: RIGHT ULNAR NERVE DECOMPRESSION WITH TRANSPOSITION POSSIBLE SUBMUSCULAR TRANSPOSITION (Right: Arm Upper)  Patient Location: PACU  Anesthesia Type:General  Level of Consciousness: awake, alert , and oriented  Airway & Oxygen Therapy: Patient Spontanous Breathing and Patient connected to face mask oxygen  Post-op Assessment: Report given to RN and Post -op Vital signs reviewed and stable  Post vital signs: Reviewed and stable  Last Vitals:  Vitals Value Taken Time  BP 116/77 08/28/22 1212  Temp    Pulse 81 08/28/22 1215  Resp 14 08/28/22 1215  SpO2 97 % 08/28/22 1215  Vitals shown include unfiled device data.  Last Pain:  Vitals:   08/28/22 0749  TempSrc: Oral  PainSc: 0-No pain         Complications: No notable events documented.

## 2022-08-28 NOTE — Op Note (Signed)
I assisted Surgeons and Role:    * Betha Loa, MD - Primary    Cindee Salt, MD - Assisting on the Procedure(s): RIGHT ULNAR NERVE DECOMPRESSION WITH TRANSPOSITION POSSIBLE SUBMUSCULAR TRANSPOSITION on 08/28/2022.  I provided assistance on this case as follows:.   set up,  approach , identification of the ulnar nerve, neurolysis of the ulnar nerve,. Elevation of the Flexor pronator mass, transposition of the ulnar nerve, closure of the wound with application of dressings and splint.       Electronically signed by: Cindee Salt, MD Date: 08/28/2022 Time: 12:03 PM

## 2022-08-28 NOTE — Op Note (Signed)
NAME: Thomas Malone MEDICAL RECORD NO: [id number] DATE OF BIRTH: 09/28/1950 FACILITY: [facility] LOCATION: [facility] PHYSICIAN: Tami Ribas, MD   OPERATIVE REPORT   DATE OF PROCEDURE: 08/28/22    PREOPERATIVE DIAGNOSIS: Right recurrent ulnar neuropathy   POSTOPERATIVE DIAGNOSIS: Right recurrent ulnar neuropathy   PROCEDURE: Right ulnar nerve decompression with submuscular transposition   SURGEON:  Betha Loa, M.D.   ASSISTANT: Cindee Salt, MD   ANESTHESIA:  General   INTRAVENOUS FLUIDS:  Per anesthesia flow sheet.   ESTIMATED BLOOD LOSS:  Minimal.   COMPLICATIONS:  None.   SPECIMENS:  none   TOURNIQUET TIME:    Total Tourniquet Time Documented: Upper Arm (Right) - 94 minutes Total: Upper Arm (Right) - 94 minutes    DISPOSITION:  Stable to PACU.   INDICATIONS: 72 year old male with copiously right ulnar neuropathy.  He has previously had subcutaneous transposition.  He continues to have numbness in the ring and small fingers.  Nerve conduction studies and ultrasound confirmed continued ulnar neuropathy.  He continues to have numbness and tingling of ring and small fingers.  Wishes to proceed with repeat decompression with submuscular transposition.  Risks, benefits and alternatives of surgery were discussed including the risks of blood loss, infection, damage to nerves, vessels, tendons, ligaments, bone for surgery, need for additional surgery, complications with wound healing, continued pain, stiffness, , recurrence.  He voiced understanding of these risks and elected to proceed.  OPERATIVE COURSE:  After being identified preoperatively by myself,  the patient and I agreed on the procedure and site of the procedure.  The surgical site was marked.  Surgical consent had been signed. Preoperative IV antibiotic prophylaxis was given. He was transferred to the operating room and placed on the operating table in supine position with the Right upper extremity on  an arm board.  General anesthesia was induced by the anesthesiologist.  Right upper extremity was prepped and draped in normal sterile orthopedic fashion.  A surgical pause was performed between the surgeons, anesthesia, and operating room staff and all were in agreement as to the patient, procedure, and site of procedure.  Tourniquet at the proximal aspect of the extremity was inflated to 250 mmHg after exsanguination of the arm with an Esmarch bandage.  Incision was followed.  Was extended proximally distally to aid in visualization.  The nerve identified proximally and distally.  It was carefully freed up from any compression.  Significant scar from the subcutaneous tissues and around the nerve.  The scar was carefully released.  Motor branches to the FCU identified and protected.  Once the nerve was decompressed and freed up to allow mobilization the flexor pronator mass was freed up and through its aponeurosis and elevated.  Any bands of fascia within the muscle and released event any kinking of the nerve.  The nerve was then able to be transposed anteriorly and underneath the flexor pronator mass.  The wound was copiously irrigated with sterile saline.  The flexor pronator fascia was then [place] to lay back down into the aponeurosis reapproximated with 3-0 Ethibond suture.  2-0 Vicryl suture was used in the subcutaneous tissues to ensure good subcutaneous tissue surrounding the remaining portion of the nerve and inverted interrupted Vicryl suture was placed in subcutaneous tissues.  The skin was closed with a stapler.  Drain had been placed in the subcutaneous tissues.  The wound was dressed with sterile Xeroform 4 x 4's and ABD and wrapped with a Kerlix bandage.  Posterior  splint placed and wrapped with Kerlix and Ace bandage. The tourniquet was deflated at 94 minutes.  Fingertips were pink with brisk capillary refill after deflation of tourniquet.  The operative  drapes were broken down.  The patient was  awoken from anesthesia safely.  He was transferred back to the stretcher and taken to PACU in stable condition.  I will see him back in the office tomorrow for drain removal and in 1 week for postoperative followup.  I will give him a prescription for Percocet 5/325 1-2 tabs PO q6 hours prn pain, dispense # 20.   Betha Loa, MD Electronically signed, 08/28/22

## 2022-08-28 NOTE — Anesthesia Postprocedure Evaluation (Signed)
Anesthesia Post Note  Patient: Thomas Malone  Procedure(s) Performed: RIGHT ULNAR NERVE DECOMPRESSION WITH TRANSPOSITION POSSIBLE SUBMUSCULAR TRANSPOSITION (Right: Arm Upper)     Patient location during evaluation: PACU Anesthesia Type: General Level of consciousness: awake and alert Pain management: pain level controlled Vital Signs Assessment: post-procedure vital signs reviewed and stable Respiratory status: spontaneous breathing, nonlabored ventilation, respiratory function stable and patient connected to nasal cannula oxygen Cardiovascular status: blood pressure returned to baseline and stable Postop Assessment: no apparent nausea or vomiting Anesthetic complications: no   No notable events documented.  Last Vitals:  Vitals:   08/28/22 1300 08/28/22 1315  BP: 117/76 126/84  Pulse: 85 77  Resp: 20 18  Temp:  (!) 36.3 C  SpO2: 97% 98%    Last Pain:  Vitals:   08/28/22 1315  TempSrc:   PainSc: 2                  Trevor Iha

## 2022-08-28 NOTE — Anesthesia Procedure Notes (Signed)
Procedure Name: LMA Insertion Date/Time: 08/28/2022 10:08 AM  Performed by: Cleda Clarks, CRNAPre-anesthesia Checklist: Patient identified, Emergency Drugs available, Suction available and Patient being monitored Patient Re-evaluated:Patient Re-evaluated prior to induction Oxygen Delivery Method: Circle system utilized Preoxygenation: Pre-oxygenation with 100% oxygen Induction Type: IV induction Ventilation: Mask ventilation without difficulty LMA: LMA inserted LMA Size: 5.0 Number of attempts: 1 Placement Confirmation: positive ETCO2 Tube secured with: Tape Dental Injury: Teeth and Oropharynx as per pre-operative assessment

## 2022-08-28 NOTE — Anesthesia Preprocedure Evaluation (Signed)
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 (+) Missing, Dental Advisory Given,    Pulmonary sleep apnea and Continuous Positive Airway Pressure Ventilation , neg COPD, former smoker   Pulmonary exam normal breath sounds clear to auscultation       Cardiovascular hypertension, Pt. on medications and Pt. on home beta blockers Normal cardiovascular exam Rhythm:Regular Rate:Normal     Neuro/Psych    GI/Hepatic ,GERD  Medicated and Controlled,,  Endo/Other  diabetes, Well Controlled, Type 2, Oral Hypoglycemic Agents    Renal/GU Renal disease     Musculoskeletal   Abdominal   Peds  Hematology   Anesthesia Other Findings   Reproductive/Obstetrics                             Anesthesia Physical Anesthesia Plan  ASA: 3  Anesthesia Plan: General   Post-op Pain Management: Ofirmev IV (intra-op)*, Tylenol PO (pre-op)* and Toradol IV (intra-op)*   Induction: Intravenous  PONV Risk Score and Plan: Treatment may vary due to age or medical condition, Midazolam, Ondansetron and Dexamethasone  Airway Management Planned: LMA  Additional Equipment: None  Intra-op Plan:   Post-operative Plan: Extubation in OR  Informed Consent: I have reviewed the patients History and Physical, chart, labs and discussed the procedure including the risks, benefits and alternatives for the proposed anesthesia with the patient or authorized representative who has indicated his/her understanding and acceptance.     Dental advisory given  Plan Discussed with: CRNA  Anesthesia Plan Comments:         Anesthesia Quick Evaluation

## 2022-08-28 NOTE — Discharge Instructions (Addendum)
May take Tylenol after 6pm, if needed   Post Anesthesia Home Care Instructions  Activity: Get plenty of rest for the remainder of the day. A responsible individual must stay with you for 24 hours following the procedure.  For the next 24 hours, DO NOT: -Drive a car -Advertising copywriter -Drink alcoholic beverages -Take any medication unless instructed by your physician -Make any legal decisions or sign important papers.  Meals: Start with liquid foods such as gelatin or soup. Progress to regular foods as tolerated. Avoid greasy, spicy, heavy foods. If nausea and/or vomiting occur, drink only clear liquids until the nausea and/or vomiting subsides. Call your physician if vomiting continues.  Special Instructions/Symptoms: Your throat may feel dry or sore from the anesthesia or the breathing tube placed in your throat during surgery. If this causes discomfort, gargle with warm salt water. The discomfort should disappear within 24 hours.  If you had a scopolamine patch placed behind your ear for the management of post- operative nausea and/or vomiting:  1. The medication in the patch is effective for 72 hours, after which it should be removed.  Wrap patch in a tissue and discard in the trash. Wash hands thoroughly with soap and water. 2. You may remove the patch earlier than 72 hours if you experience unpleasant side effects which may include dry mouth, dizziness or visual disturbances. 3. Avoid touching the patch. Wash your hands with soap and water after contact with the patch.   [facility] Instructions Hand Surgery  Wound Care: Keep your hand elevated above the level of your heart.  Do not allow it to dangle by your side.  Keep the dressing dry and do not remove it unless your doctor advises you to do so.  He will usually change it at the time of your post-op visit.  Moving your fingers is advised to stimulate circulation but will depend on the site of your surgery.  If you have a splint  applied, your doctor will advise you regarding movement.  Activity: Do not drive or operate machinery today.  Rest today and then you may return to your normal activity and work as indicated by your physician.  Diet:  Drink liquids today or eat a light diet.  You may resume a regular diet tomorrow.    General expectations: Pain for two to three days. Fingers may become slightly swollen.  Call your doctor if any of the following occur: Severe pain not relieved by pain medication. Elevated temperature. Dressing soaked with blood. Inability to move fingers. White or bluish color to fingers.       JP Drain [employer] this sheet to all of your post-operative appointments while you have your drains. Please measure your drains by CC's or ML's. Make sure you drain and measure your JP Drains 3 times per day. At the end of each day, add up totals for the left side and add up totals for the right side.    ( 9 am )     ( 3 pm )        ( 9 pm )                Date L  R  L  R  L  R  Total L/R

## 2022-08-29 ENCOUNTER — Encounter (HOSPITAL_BASED_OUTPATIENT_CLINIC_OR_DEPARTMENT_OTHER): Payer: Self-pay | Admitting: Orthopedic Surgery

## 2022-09-18 ENCOUNTER — Ambulatory Visit: Payer: PPO | Admitting: Internal Medicine

## 2022-09-23 ENCOUNTER — Encounter: Payer: Self-pay | Admitting: Internal Medicine

## 2022-10-07 ENCOUNTER — Ambulatory Visit (INDEPENDENT_AMBULATORY_CARE_PROVIDER_SITE_OTHER): Payer: PPO

## 2022-10-07 VITALS — BP 129/90 | HR 69 | Ht 70.0 in | Wt 240.0 lb

## 2022-10-07 DIAGNOSIS — Z0000 Encounter for general adult medical examination without abnormal findings: Secondary | ICD-10-CM | POA: Diagnosis not present

## 2022-10-07 NOTE — Patient Instructions (Addendum)
Thomas Malone , Thank you for taking time to come for your Medicare Wellness Visit. I appreciate your ongoing commitment to your health goals. Please review the following plan we discussed and let me know if I can assist you in the future.   Referrals/Orders/Follow-Ups/Clinician Recommendations:  You are due for the vaccines checked below. You may have these done at your preferred pharmacy. Please have them fax the office proof of the vaccines so that we can update your chart.   [x]  Flu (due annually) []  Shingrix (Shingles vaccine) [x]  Pneumonia Vaccines 2nd vaccine in the series []  TDAP (Tetanus) Vaccine every 10 years [x]  Covid-19   This is a list of the screening recommended for you and due dates:  Health Maintenance  Topic Date Due   Complete foot exam   Never done   Eye exam for diabetics  Never done   Zoster (Shingles) Vaccine (1 of 2) Never done   Pneumonia Vaccine (2 of 2 - PCV) 06/01/2019   Flu Shot  09/04/2022   COVID-19 Vaccine (5 - 2023-24 season) 10/05/2022   Hemoglobin A1C  02/06/2023   Yearly kidney function blood test for diabetes  08/06/2023   Yearly kidney health urinalysis for diabetes  08/06/2023   Medicare Annual Wellness Visit  10/07/2023   Colon Cancer Screening  06/22/2024   DTaP/Tdap/Td vaccine (3 - Td or Tdap) 07/04/2032   Hepatitis C Screening  Completed   HPV Vaccine  Aged Out    Advanced directives: (Declined) Advance directive discussed with you today. Even though you declined this today, please call our office should you change your mind, and we can give you the proper paperwork for you to fill out.  Next Medicare Annual Wellness Visit scheduled for next year: Yes  Preventive Care 71 Years and Older, Male Preventive care refers to lifestyle choices and visits with your health care provider that can promote health and wellness. Preventive care visits are also called wellness exams. What can I expect for my preventive care visit? Counseling During your  preventive care visit, your health care provider may ask about your: Medical history, including: Past medical problems. Family medical history. History of falls. Current health, including: Emotional well-being. Home life and relationship well-being. Sexual activity. Memory and ability to understand (cognition). Lifestyle, including: Alcohol, nicotine or tobacco, and drug use. Access to firearms. Diet, exercise, and sleep habits. Work and work Astronomer. Sunscreen use. Safety issues such as seatbelt and bike helmet use. Physical exam Your health care provider will check your: Height and weight. These may be used to calculate your BMI (body mass index). BMI is a measurement that tells if you are at a healthy weight. Waist circumference. This measures the distance around your waistline. This measurement also tells if you are at a healthy weight and may help predict your risk of certain diseases, such as type 2 diabetes and high blood pressure. Heart rate and blood pressure. Body temperature. Skin for abnormal spots. What immunizations do I need?  Vaccines are usually given at various ages, according to a schedule. Your health care provider will recommend vaccines for you based on your age, medical history, and lifestyle or other factors, such as travel or where you work. What tests do I need? Screening Your health care provider may recommend screening tests for certain conditions. This may include: Lipid and cholesterol levels. Diabetes screening. This is done by checking your blood sugar (glucose) after you have not eaten for a while (fasting). Hepatitis C test. Hepatitis B  test. HIV (human immunodeficiency virus) test. STI (sexually transmitted infection) testing, if you are at risk. Lung cancer screening. Colorectal cancer screening. Prostate cancer screening. Abdominal aortic aneurysm (AAA) screening. You may need this if you are a current or former smoker. Talk with your  health care provider about your test results, treatment options, and if necessary, the need for more tests. Follow these instructions at home: Eating and drinking  Eat a diet that includes fresh fruits and vegetables, whole grains, lean protein, and low-fat dairy products. Limit your intake of foods with high amounts of sugar, saturated fats, and salt. Take vitamin and mineral supplements as recommended by your health care provider. Do not drink alcohol if your health care provider tells you not to drink. If you drink alcohol: Limit how much you have to 0-2 drinks a day. Know how much alcohol is in your drink. In the [place] one drink equals one 12 oz bottle of beer (355 mL), one 5 oz glass of wine (148 mL), or one 1 oz glass of hard liquor (44 mL). Lifestyle Brush your teeth every morning and night with fluoride toothpaste. Floss one time each day. Exercise for at least 30 minutes 5 or more days each week. Do not use any products that contain nicotine or tobacco. These products include cigarettes, chewing tobacco, and vaping devices, such as e-cigarettes. If you need help quitting, ask your health care provider. Do not use drugs. If you are sexually active, practice safe sex. Use a condom or other form of protection to prevent STIs. Take aspirin only as told by your health care provider. Make sure that you understand how much to take and what form to take. Work with your health care provider to find out whether it is safe and beneficial for you to take aspirin daily. Ask your health care provider if you need to take a cholesterol-lowering medicine (statin). Find healthy ways to manage stress, such as: Meditation, yoga, or listening to music. Journaling. Talking to a trusted person. Spending time with friends and family. Safety Always wear your seat belt while driving or riding in a vehicle. Do not drive: If you have been drinking alcohol. Do not ride with someone who has been  drinking. When you are tired or distracted. While texting. If you have been using any mind-altering substances or drugs. Wear a helmet and other protective equipment during sports activities. If you have firearms in your house, make sure you follow all gun safety procedures. Minimize exposure to UV radiation to reduce your risk of skin cancer. What's next? Visit your health care provider once a year for an annual wellness visit. Ask your health care provider how often you should have your eyes and teeth checked. Stay up to date on all vaccines. This information is not intended to replace advice given to you by your health care provider. Make sure you discuss any questions you have with your health care provider. Document Revised: 07/18/2020 Document Reviewed: 07/18/2020 Elsevier Patient Education  2024 [employer]. Understanding Your Risk for Falls Millions of people have serious injuries from falls each year. It is important to understand your risk of falling. Talk with your health care provider about your risk and what you can do to lower it. If you do have a serious fall, make sure to tell your provider. Falling once raises your risk of falling again. How can falls affect me? Serious injuries from falls are common. These include: Broken bones, such as hip fractures. Head injuries,  such as traumatic brain injuries (TBI) or concussions. A fear of falling can cause you to avoid activities and stay at home. This can make your muscles weaker and raise your risk for a fall. What can increase my risk? There are a number of risk factors that increase your risk for falling. The more risk factors you have, the higher your risk of falling. Serious injuries from a fall happen most often to people who are older than 71 years old. Teenagers and young adults ages 67-29 are also at higher risk. Common risk factors include: Weakness in the lower body. Being generally weak or confused due to long-term  (chronic) illness. Dizziness or balance problems. Poor vision. Medicines that cause dizziness or drowsiness. These may include: Medicines for your blood pressure, heart, anxiety, insomnia, or swelling (edema). Pain medicines. Muscle relaxants. Other risk factors include: Drinking alcohol. Having had a fall in the past. Having foot pain or wearing improper footwear. Working at a dangerous job. Having any of the following in your home: Tripping hazards, such as floor clutter or loose rugs. Poor lighting. Pets. Having dementia or memory loss. What actions can I take to lower my risk of falling?     Physical activity Stay physically fit. Do strength and balance exercises. Consider taking a regular class to build strength and balance. Yoga and tai chi are good options. Vision Have your eyes checked every year and your prescription for glasses or contacts updated as needed. Shoes and walking aids Wear non-skid shoes. Wear shoes that have rubber soles and low heels. Do not wear high heels. Do not walk around the house in socks or slippers. Use a cane or walker as told by your provider. Home safety Attach secure railings on both sides of your stairs. Install grab bars for your bathtub, shower, and toilet. Use a non-skid mat in your bathtub or shower. Attach bath mats securely with double-sided, non-slip rug tape. Use good lighting in all rooms. Keep a flashlight near your bed. Make sure there is a clear path from your bed to the bathroom. Use night-lights. Do not use throw rugs. Make sure all carpeting is taped or tacked down securely. Remove all clutter from walkways and stairways, including extension cords. Repair uneven or broken steps and floors. Avoid walking on icy or slippery surfaces. Walk on the grass instead of on icy or slick sidewalks. Use ice melter to get rid of ice on walkways in the winter. Use a cordless phone. Questions to ask your health care provider Can you help  me check my risk for a fall? Do any of my medicines make me more likely to fall? Should I take a vitamin D supplement? What exercises can I do to improve my strength and balance? Should I make an appointment to have my vision checked? Do I need a bone density test to check for weak bones (osteoporosis)? Would it help to use a cane or a walker? Where to find more information Centers for Disease Control and Prevention, STEADI: TonerPromos.no Community-Based Fall Prevention Programs: TonerPromos.no [employer] on Aging: BaseRingTones.pl Contact a health care provider if: You fall at home. You are afraid of falling at home. You feel weak, drowsy, or dizzy. This information is not intended to replace advice given to you by your health care provider. Make sure you discuss any questions you have with your health care provider. Document Revised: 09/23/2021 Document Reviewed: 09/23/2021 Elsevier Patient Education  2024 Else[employer] Inc. Managing Pain Without Opioids Opioids are strong  medicines used to treat moderate to severe pain. For some people, especially those who have long-term (chronic) pain, opioids may not be the best choice for pain management due to: Side effects like nausea, constipation, and sleepiness. The risk of addiction (opioid use disorder). The longer you take opioids, the greater your risk of addiction. Pain that lasts for more than 3 months is called chronic pain. Managing chronic pain usually requires more than one approach and is often provided by a team of health care providers working together (multidisciplinary approach). Pain management may be done at a pain management center or pain clinic. How to manage pain without the use of opioids Use non-opioid medicines Non-opioid medicines for pain may include: Over-the-counter or prescription non-steroidal anti-inflammatory drugs (NSAIDs). These may be the first medicines used for pain. They work well for muscle and bone pain, and they reduce  swelling. Acetaminophen. This over-the-counter medicine may work well for milder pain but not swelling. Antidepressants. These may be used to treat chronic pain. A certain type of antidepressant (tricyclics) is often used. These medicines are given in lower doses for pain than when used for depression. Anticonvulsants. These are usually used to treat seizures but may also reduce nerve (neuropathic) pain. Muscle relaxants. These relieve pain caused by sudden muscle tightening (spasms). You may also use a pain medicine that is applied to the skin as a patch, cream, or gel (topical analgesic), such as a numbing medicine. These may cause fewer side effects than medicines taken by mouth. Do certain therapies as directed Some therapies can help with pain management. They include: Physical therapy. You will do exercises to gain strength and flexibility. A physical therapist may teach you exercises to move and stretch parts of your body that are weak, stiff, or painful. You can learn these exercises at physical therapy visits and practice them at home. Physical therapy may also involve: Massage. Heat wraps or applying heat or cold to affected areas. Electrical signals that interrupt pain signals (transcutaneous electrical nerve stimulation, TENS). Weak lasers that reduce pain and swelling (low-level laser therapy). Signals from your body that help you learn to regulate pain (biofeedback). Occupational therapy. This helps you to learn ways to function at home and work with less pain. Recreational therapy. This involves trying new activities or hobbies, such as a physical activity or drawing. Mental health therapy, including: Cognitive behavioral therapy (CBT). This helps you learn coping skills for dealing with pain. Acceptance and commitment therapy (ACT) to change the way you think and react to pain. Relaxation therapies, including muscle relaxation exercises and mindfulness-based stress reduction. Pain  management counseling. This may be individual, family, or group counseling.  Receive medical treatments Medical treatments for pain management include: Nerve block injections. These may include a pain blocker and anti-inflammatory medicines. You may have injections: Near the spine to relieve chronic back or neck pain. Into joints to relieve back or joint pain. Into nerve areas that supply a painful area to relieve body pain. Into muscles (trigger point injections) to relieve some painful muscle conditions. A medical device placed near your spine to help block pain signals and relieve nerve pain or chronic back pain (spinal cord stimulation device). Acupuncture. Follow these instructions at home Medicines Take over-the-counter and prescription medicines only as told by your health care provider. If you are taking pain medicine, ask your health care providers about possible side effects to watch out for. Do not drive or use heavy machinery while taking prescription opioid pain medicine. Lifestyle  Do not use drugs or alcohol to reduce pain. If you drink alcohol, limit how much you have to: 0-1 drink a day for women who are not pregnant. 0-2 drinks a day for men. Know how much alcohol is in a drink. In the [place] one drink equals one 12 oz bottle of beer (355 mL), one 5 oz glass of wine (148 mL), or one 1 oz glass of hard liquor (44 mL). Do not use any products that contain nicotine or tobacco. These products include cigarettes, chewing tobacco, and vaping devices, such as e-cigarettes. If you need help quitting, ask your health care provider. Eat a healthy diet and maintain a healthy weight. Poor diet and excess weight may make pain worse. Eat foods that are high in fiber. These include fresh fruits and vegetables, whole grains, and beans. Limit foods that are high in fat and processed sugars, such as fried and sweet foods. Exercise regularly. Exercise lowers stress and may help relieve  pain. Ask your health care provider what activities and exercises are safe for you. If your health care provider approves, join an exercise class that combines movement and stress reduction. Examples include yoga and tai chi. Get enough sleep. Lack of sleep may make pain worse. Lower stress as much as possible. Practice stress reduction techniques as told by your therapist. General instructions Work with all your pain management providers to find the treatments that work best for you. You are an important member of your pain management team. There are many things you can do to reduce pain on your own. Consider joining an online or in-person support group for people who have chronic pain. Keep all follow-up visits. This is important. Where to find more information You can find more information about managing pain without opioids from: American Academy of Pain Medicine: painmed.org Institute for Chronic Pain: instituteforchronicpain.org American Chronic Pain Association: theacpa.org Contact a health care provider if: You have side effects from pain medicine. Your pain gets worse or does not get better with treatments or home therapy. You are struggling with anxiety or depression. Summary Many types of pain can be managed without opioids. Chronic pain may respond better to pain management without opioids. Pain is best managed when you and a team of health care providers work together. Pain management without opioids may include non-opioid medicines, medical treatments, physical therapy, mental health therapy, and lifestyle changes. Tell your health care providers if your pain gets worse or is not being managed well enough. This information is not intended to replace advice given to you by your health care provider. Make sure you discuss any questions you have with your health care provider. Document Revised: 05/02/2020 Document Reviewed: 05/02/2020 Elsevier Patient Education  2024 [employer].

## 2022-10-07 NOTE — Progress Notes (Signed)
Because this visit was a virtual/telehealth visit,  certain criteria was not obtained, such a blood pressure, CBG if patient is a diabetic, and timed get up and go. Any medications not marked as "taking" was not mentioned during the medication reconciliation part of the visit. Any vitals not documented were not able to be obtained due to this being a telehealth visit. Vitals that have been documented are verbally provided by the patient.  Patient was unable to self-report a recent blood pressure reading due to a lack of equipment at home via telehealth.  Subjective:   Thomas Malone is a 72 y.o. male who presents for an Initial Medicare Annual Wellness Visit.  Visit Complete: Virtual  I connected with  Thomas Malone on 10/07/22 by a audio enabled telemedicine application and verified that I am speaking with the correct person using two identifiers.  Patient Location: Home  Provider Location: Home Office  I discussed the limitations of evaluation and management by telemedicine. The patient expressed understanding and agreed to proceed.  Patient Medicare AWV questionnaire was completed by the patient on na; I have confirmed that all information answered by patient is correct and no changes since this date.  Review of Systems     Cardiac Risk Factors include: advanced age (>70men, >44 women);diabetes mellitus;dyslipidemia;hypertension;male gender;obesity (BMI >30kg/m2);sedentary lifestyle     Objective:    Today's Vitals   10/07/22 1402  BP: (!) 129/90  Pulse: 69  Weight: 240 lb (108.9 kg)  Height: 5\' 10"  (1.778 m)   Body mass index is 34.44 kg/m.     10/07/2022    2:06 PM 08/28/2022    7:44 AM 07/05/2022   11:29 AM 05/16/2022    9:05 AM 11/07/2021    9:03 AM 10/21/2021    1:15 PM 11/19/2019   12:30 AM  Advanced Directives  Does Patient Have a Medical Advance Directive? No No No No No No No  Does patient want to make changes to medical advance directive?      Yes  (MAU/Ambulatory/Procedural Areas - Information given)   Would patient like information on creating a medical advance directive? No - Patient declined No - Patient declined  No - Patient declined No - Patient declined No - Patient declined     Current Medications (verified) Outpatient Encounter Medications as of 10/07/2022  Medication Sig   alfuzosin (UROXATRAL) 10 MG 24 hr tablet Take 1 tablet (10 mg total) by mouth daily with breakfast.   amLODipine (NORVASC) 10 MG tablet Take 5 mg by mouth daily.   chlorthalidone (HYGROTON) 25 MG tablet Take 25 mg by mouth daily.   finasteride (PROSCAR) 5 MG tablet Take 1 tablet (5 mg total) by mouth daily.   FREESTYLE LITE test strip SMARTSIG:Via Meter   glipiZIDE (GLUCOTROL) 5 MG tablet Take 5 mg by mouth every morning.   Glucosamine-Chondroitin (COSAMIN DS PO) Take 1 tablet by mouth daily.   irbesartan (AVAPRO) 150 MG tablet TAKE ONE TABLET (150MG  TOTAL) BY MOUTH DAILY   Lancets (FREESTYLE) lancets 1 each by Other route 2 (two) times daily.   Multiple Vitamin (MULTIVITAMIN WITH MINERALS) TABS tablet Take 1 tablet by mouth daily.   omeprazole (PRILOSEC) 40 MG capsule Take 40 mg by mouth every other day.    pravastatin (PRAVACHOL) 10 MG tablet Take 10 mg by mouth at bedtime.   oxyCODONE-acetaminophen (PERCOCET) 5-325 MG tablet 1-2 tabs po q6 hours prn pain (Patient not taking: Reported on 10/07/2022)   tadalafil (CIALIS) 5 MG tablet  Take 1 tablet (5 mg total) by mouth daily.   No facility-administered encounter medications on file as of 10/07/2022.    Allergies (verified) Diclofenac, Diclofenac sodium, Levaquin [levofloxacin in d5w], and Lidocaine   History: Past Medical History:  Diagnosis Date   Abnormal CT of the chest 01/23/2014   BPH (benign prostatic hyperplasia) 05/31/2018   Complication of anesthesia    hard to wake up    Diabetes mellitus without complication (HCC)    Elevated PSA    Erectile dysfunction    Gastroesophageal reflux    H/O  sarcoidosis    Headache    7-8 months from gabapentin and naproxen   Hypertension    Pneumonia of upper lobe of lung 12/1213   Sleep apnea    Past Surgical History:  Procedure Laterality Date   ANTERIOR CERVICAL DECOMP/DISCECTOMY FUSION N/A 05/28/2018   CATARACT EXTRACTION W/PHACO Left 04/11/2019   Procedure: CATARACT EXTRACTION PHACO AND INTRAOCULAR LENS PLACEMENT (IOC) (CDE:5.80);  Surgeon: Fabio Pierce, MD;  Location: [facility];  Service: Ophthalmology;  Laterality: Left;   CATARACT EXTRACTION W/PHACO Right 06/03/2019   Procedure: CATARACT EXTRACTION PHACO AND INTRAOCULAR LENS PLACEMENT RIGHT EYE;  Surgeon: Fabio Pierce, MD;  Location: [facility];  Service: Ophthalmology;  Laterality: Right;  CDE: 9.82   COLONOSCOPY     COLONOSCOPY N/A 06/23/2014   Procedure: COLONOSCOPY;  Surgeon: Franky Macho Md, MD;  [facility];  Service: Gastroenterology;  Laterality: N/A;   ELBOW SURGERY Right    2021   EYE SURGERY Bilateral    lasic   ORTHOPEDIC SURGERY Left    heel surgery   PROSTATE BIOPSY     SHOULDER ARTHROSCOPY Right    repair of tendons and ligaments   TONSILLECTOMY     ULNAR NERVE TRANSPOSITION Right 08/28/2022   Procedure: RIGHT ULNAR NERVE DECOMPRESSION WITH TRANSPOSITION POSSIBLE SUBMUSCULAR TRANSPOSITION;  Surgeon: Betha Loa, MD;  Location: [facility];  Service: Orthopedics;  Laterality: Right;  120 MIN   WRIST SURGERY     2021   Family History  Problem Relation Age of Onset   Lymphoma Mother    Heart disease Father    Hyperlipidemia Sister    Diabetes Sister    Heart disease Sister    Breast cancer Sister    Hyperlipidemia Brother    Diabetes Brother    Social History   Socioeconomic History   Marital status: Married    Spouse name: Not on file   Number of children: 2   Years of education: Not on file   Highest education level: Not on file  Occupational History   Occupation: concrete finisher  Tobacco Use   Smoking status: Former     Types: Cigars   Smokeless tobacco: Never   Tobacco comments:    and pipe-none since 2010  Vaping Use   Vaping status: Never Used  Substance and Sexual Activity   Alcohol use: No   Drug use: No   Sexual activity: Yes    Birth control/protection: None  Other Topics Concern   Not on file  Social History Narrative   Not on file   Social Determinants of Health   Financial Resource Strain: Low Risk  (10/07/2022)   Overall Financial Resource Strain (CARDIA)    Difficulty of Paying Living Expenses: Not hard at all  Food Insecurity: No Food Insecurity (10/07/2022)   Hunger Vital Sign    Worried About Running Out of Food in the Last Year: Never true  Ran Out of Food in the Last Year: Never true  Transportation Needs: No Transportation Needs (10/07/2022)   PRAPARE - Administrator, Civil Service (Medical): No    Lack of Transportation (Non-Medical): No  Physical Activity: Sufficiently Active (10/07/2022)   Exercise Vital Sign    Days of Exercise per Week: 7 days    Minutes of Exercise per Session: 30 min  Stress: No Stress Concern Present (10/07/2022)   [employer] of Occupational Health - Occupational Stress Questionnaire    Feeling of Stress : Not at all  Social Connections: Moderately Integrated (10/07/2022)   Social Connection and Isolation Panel [NHANES]    Frequency of Communication with Friends and Family: More than three times a week    Frequency of Social Gatherings with Friends and Family: More than three times a week    Attends Religious Services: More than 4 times per year    Active Member of [employer] or Organizations: No    Attends Banker Meetings: Never    Marital Status: Married    Tobacco Counseling Counseling given: Yes Tobacco comments: and pipe-none since 2010   Clinical Intake:  Pre-visit preparation completed: Yes  Pain : No/denies pain     BMI - recorded: 34.44 Nutritional Status: BMI > 30  Obese Nutritional Risks:  None Diabetes: Yes CBG done?: No (telehealth visit. unable to obtain bp) Did pt. bring in CBG monitor from home?: No  How often do you need to have someone help you when you read instructions, pamphlets, or other written materials from your doctor or pharmacy?: 1 - Never  Interpreter Needed?: No  Information entered by :: Abby Lonie Newsham, CMA   Activities of Daily Living    10/07/2022    2:05 PM 08/28/2022    7:47 AM  In your present state of health, do you have any difficulty performing the following activities:  Hearing? 0 0  Vision? 0 0  Difficulty concentrating or making decisions? 0 0  Walking or climbing stairs? 0 0  Dressing or bathing? 0 0  Doing errands, shopping? 0   Preparing Food and eating ? N   Using the Toilet? N   In the past six months, have you accidently leaked urine? N   Do you have problems with loss of bowel control? N   Managing your Medications? N   Managing your Finances? N   Housekeeping or managing your Housekeeping? N     Patient Care Team: [name], MD as PCP - General (Internal Medicine) Meriam Sprague, MD (Inactive) as PCP - Cardiology (Cardiology) Loreli Slot, MD as Consulting Physician (Cardiothoracic Surgery)  Indicate any recent Medical Services you may have received from other than [facility] providers in the past year (date may be approximate).     Assessment:   This is a routine wellness examination for [place].  Hearing/Vision screen Hearing Screening - Comments:: Patient complains of difficulty with hearing. Declines referral to audiology. He states he will just see his doctor about it.   Vision Screening - Comments:: Patient states they wear reading glasses only.  Establ[facility] with [facility]  Dietary issues and exercise activities discussed:     Goals Addressed             This Visit's Progress    Patient Stated       Increase energy level       Depression Screen    10/07/2022    2:08 PM  08/06/2022  8:13 AM  PHQ 2/9 Scores  PHQ - 2 Score 0 0  PHQ- 9 Score  0    Fall Risk    10/07/2022    2:07 PM 08/06/2022    8:13 AM  Fall Risk   Falls in the past year? 0 0  Number falls in past yr: 0 0  Injury with Fall? 0 0  Risk for fall due to : No Fall Risks No Fall Risks  Follow up Falls prevention discussed Falls evaluation completed    MEDICARE RISK AT HOME: Medicare Risk at Home Any stairs in or around the home?: Yes If so, are there any without handrails?: No Home free of loose throw rugs in walkways, pet beds, electrical cords, etc?: Yes Adequate lighting in your home to reduce risk of falls?: Yes Life alert?: No Use of a cane, walker or w/c?: No Grab bars in the bathroom?: Yes Shower chair or bench in shower?: Yes Elevated toilet seat or a handicapped toilet?: Yes  TIMED UP AND GO:  Was the test performed? No    Cognitive Function:        10/07/2022    2:08 PM  6CIT Screen  What Year? 0 points  What month? 0 points  What time? 0 points  Count back from 20 0 points  Months in reverse 0 points  Repeat phrase 0 points  Total Score 0 points    Immunizations Immunization History  Administered Date(s) Administered   Influenza,inj,quad, With Preservative 11/03/2016, 01/15/2018   Moderna Sars-Covid-2 Vaccination 03/31/2019, 04/29/2019, 12/14/2019, 07/04/2020   Pneumococcal Polysaccharide-23 11/28/2015, 06/01/2018   Td 08/08/2016   Tdap 07/05/2022    TDAP status: Up to date  Flu Vaccine status: Due, Education has been provided regarding the importance of this vaccine. Advised may receive this vaccine at local pharmacy or Health Dept. Aware to provide a copy of the vaccination record if obtained from local pharmacy or Health Dept. Verbalized acceptance and understanding.  Pneumococcal vaccine status: Due, Education has been provided regarding the importance of this vaccine. Advised may receive this vaccine at local pharmacy or Health Dept. Aware to provide  a copy of the vaccination record if obtained from local pharmacy or Health Dept. Verbalized acceptance and understanding.  Covid-19 vaccine status: Information provided on how to obtain vaccines.   Qualifies for Shingles Vaccine? Yes   Zostavax completed No   Shingrix Completed?: No.    Education has been provided regarding the importance of this vaccine. Patient has been advised to call insurance company to determine out of pocket expense if they have not yet received this vaccine. Advised may also receive vaccine at local pharmacy or Health Dept. Verbalized acceptance and understanding.  Screening Tests Health Maintenance  Topic Date Due   Medicare Annual Wellness (AWV)  Never done   FOOT EXAM  Never done   OPHTHALMOLOGY EXAM  Never done   Zoster Vaccines- Shingrix (1 of 2) Never done   Pneumonia Vaccine 85+ Years old (2 of 2 - PCV) 06/01/2019   INFLUENZA VACCINE  09/04/2022   COVID-19 Vaccine (5 - 2023-24 season) 10/05/2022   HEMOGLOBIN A1C  02/06/2023   Diabetic kidney evaluation - eGFR measurement  08/06/2023   Diabetic kidney evaluation - Urine ACR  08/06/2023   Colonoscopy  06/22/2024   DTaP/Tdap/Td (3 - Td or Tdap) 07/04/2032   Hepatitis C Screening  Completed   HPV VACCINES  Aged Out    Health Maintenance  Health Maintenance Due  Topic Date Due  Medicare Annual Wellness (AWV)  Never done   FOOT EXAM  Never done   OPHTHALMOLOGY EXAM  Never done   Zoster Vaccines- Shingrix (1 of 2) Never done   Pneumonia Vaccine 91+ Years old (2 of 2 - PCV) 06/01/2019   INFLUENZA VACCINE  09/04/2022   COVID-19 Vaccine (5 - 2023-24 season) 10/05/2022    Colorectal cancer screening: Type of screening: Colonoscopy. Completed 06/23/2014. Repeat every 10 years  Lung Cancer Screening: (Low Dose CT Chest recommended if Age 44-80 years, 20 pack-year currently smoking OR have quit w/in 15years.) does not qualify.   Lung Cancer Screening Referral: na  Additional Screening:  Hepatitis C  Screening: does not qualify; Completed 08/09/2022  Vision Screening: Recommended annual ophthalmology exams for early detection of glaucoma and other disorders of the eye. Is the patient up to date with their annual eye exam?  No  Who is the provider or what is the name of the office in which the patient attends annual eye exams? [facility] If pt is not established with a provider, would they like to be referred to a provider to establish care? No .   Dental Screening: Recommended annual dental exams for proper oral hygiene  Diabetic Foot Exam: Diabetic Foot Exam: Overdue, Pt has been advised about the importance in completing this exam. Pt is scheduled for diabetic foot exam on provider made aware.  Community Resource Referral / Chronic Care Management: CRR required this visit?  No   CCM required this visit?  No    Plan:     I have personally reviewed and noted the following in the patient's chart:   Medical and social history Use of alcohol, tobacco or illicit drugs  Current medications and supplements including opioid prescriptions. Patient is currently taking opioid prescriptions. Information provided to patient regarding non-opioid alternatives. Patient advised to discuss non-opioid treatment plan with their provider. Functional ability and status Nutritional status Physical activity Advanced directives List of other physicians Hospitalizations, surgeries, and ER visits in previous 12 months Vitals Screenings to include cognitive, depression, and falls Referrals and appointments  In addition, I have reviewed and discussed with patient certain preventive protocols, quality metrics, and best practice recommendations. A written personalized care plan for preventive services as well as general preventive health recommendations were provided to patient.     Jordan Hawks Lucylle Foulkes, CMA   10/07/2022   After Visit Summary: (MyChart) Due to this being a telephonic visit, the after  visit summary with patients personalized plan was offered to patient via MyChart   Nurse Notes: Patient states he no longer takes the oxycodone.

## 2022-10-13 ENCOUNTER — Other Ambulatory Visit: Payer: PPO

## 2022-10-18 LAB — PSA: Prostate Specific Ag, Serum: 7.4 ng/mL — ABNORMAL HIGH (ref 0.0–4.0)

## 2022-10-24 ENCOUNTER — Ambulatory Visit: Payer: PPO | Admitting: Urology

## 2022-10-24 VITALS — BP 117/78 | HR 69

## 2022-10-24 DIAGNOSIS — R351 Nocturia: Secondary | ICD-10-CM | POA: Diagnosis not present

## 2022-10-24 DIAGNOSIS — N529 Male erectile dysfunction, unspecified: Secondary | ICD-10-CM | POA: Diagnosis not present

## 2022-10-24 DIAGNOSIS — R972 Elevated prostate specific antigen [PSA]: Secondary | ICD-10-CM | POA: Diagnosis not present

## 2022-10-24 DIAGNOSIS — N401 Enlarged prostate with lower urinary tract symptoms: Secondary | ICD-10-CM

## 2022-10-24 DIAGNOSIS — N138 Other obstructive and reflux uropathy: Secondary | ICD-10-CM

## 2022-10-24 LAB — URINALYSIS, ROUTINE W REFLEX MICROSCOPIC
Bilirubin, UA: NEGATIVE
Glucose, UA: NEGATIVE
Ketones, UA: NEGATIVE
Leukocytes,UA: NEGATIVE
Nitrite, UA: NEGATIVE
Protein,UA: NEGATIVE
RBC, UA: NEGATIVE
Specific Gravity, UA: 1.01 (ref 1.005–1.030)
Urobilinogen, Ur: 0.2 mg/dL (ref 0.2–1.0)
pH, UA: 6.5 (ref 5.0–7.5)

## 2022-10-24 MED ORDER — FINASTERIDE 5 MG PO TABS: 5.0000 mg | ORAL_TABLET | Freq: Every day | ORAL | 3 refills | Status: DC

## 2022-10-24 MED ORDER — ALFUZOSIN HCL ER 10 MG PO TB24: 10.0000 mg | ORAL_TABLET | Freq: Every day | ORAL | 3 refills | Status: DC

## 2022-10-24 NOTE — Progress Notes (Unsigned)
10/24/2022 8:55 AM   Thomas Malone 1950/06/28 [id number]  Referring provider: Gareth Morgan, MD [address] [place],  [place] [address]  No chief complaint on file.   HPI: PSA decreased to to 7.4 from 10.4. IPSS 4 QOL 2 on uroxatral 10mg  at bedtime and finasteride 5mg  daily. Nocturia 1x.    PMH: Past Medical History:  Diagnosis Date   Abnormal CT of the chest 01/23/2014   BPH (benign prostatic hyperplasia) 05/31/2018   Complication of anesthesia    hard to wake up    Diabetes mellitus without complication (HCC)    Elevated PSA    Erectile dysfunction    Gastroesophageal reflux    H/O sarcoidosis    Headache    7-8 months from gabapentin and naproxen   Hypertension    Pneumonia of upper lobe of lung 12/1213   Sleep apnea     Surgical History: Past Surgical History:  Procedure Laterality Date   ANTERIOR CERVICAL DECOMP/DISCECTOMY FUSION N/A 05/28/2018   CATARACT EXTRACTION W/PHACO Left 04/11/2019   Procedure: CATARACT EXTRACTION PHACO AND INTRAOCULAR LENS PLACEMENT (IOC) (CDE:5.80);  Surgeon: Fabio Pierce, MD;  Location: [facility];  Service: Ophthalmology;  Laterality: Left;   CATARACT EXTRACTION W/PHACO Right 06/03/2019   Procedure: CATARACT EXTRACTION PHACO AND INTRAOCULAR LENS PLACEMENT RIGHT EYE;  Surgeon: Fabio Pierce, MD;  Location: [facility];  Service: Ophthalmology;  Laterality: Right;  CDE: 9.82   COLONOSCOPY     COLONOSCOPY N/A 06/23/2014   Procedure: COLONOSCOPY;  Surgeon: Franky Macho Md, MD;  [facility];  Service: Gastroenterology;  Laterality: N/A;   ELBOW SURGERY Right    2021   EYE SURGERY Bilateral    lasic   ORTHOPEDIC SURGERY Left    heel surgery   PROSTATE BIOPSY     SHOULDER ARTHROSCOPY Right    repair of tendons and ligaments   TONSILLECTOMY     ULNAR NERVE TRANSPOSITION Right 08/28/2022   Procedure: RIGHT ULNAR NERVE DECOMPRESSION WITH TRANSPOSITION POSSIBLE SUBMUSCULAR TRANSPOSITION;  Surgeon: Betha Loa, MD;   Location: [facility];  Service: Orthopedics;  Laterality: Right;  120 MIN   WRIST SURGERY     2021    Home Medications:  Allergies as of 10/24/2022       Reactions   Diclofenac Anaphylaxis   Diclofenac Sodium Anaphylaxis   Topical gel   Levaquin [levofloxacin In D5w] Anaphylaxis   Lidocaine Anaphylaxis        Medication List        Accurate as of October 24, 2022  8:55 AM. If you have any questions, ask your nurse or doctor.          alfuzosin 10 MG 24 hr tablet Commonly known as: UROXATRAL Take 1 tablet (10 mg total) by mouth daily with breakfast.   amLODipine 10 MG tablet Commonly known as: NORVASC Take 5 mg by mouth daily.   chlorthalidone 25 MG tablet Commonly known as: HYGROTON Take 25 mg by mouth daily.   COSAMIN DS PO Take 1 tablet by mouth daily.   finasteride 5 MG tablet Commonly known as: PROSCAR Take 1 tablet (5 mg total) by mouth daily.   freestyle lancets 1 each by Other route 2 (two) times daily.   FREESTYLE LITE test strip Generic drug: glucose blood SMARTSIG:Via Meter   glipiZIDE 5 MG tablet Commonly known as: GLUCOTROL Take 5 mg by mouth every morning.   irbesartan 150 MG tablet Commonly known as: AVAPRO TAKE ONE TABLET (150MG  TOTAL)  BY MOUTH DAILY   multivitamin with minerals Tabs tablet Take 1 tablet by mouth daily.   omeprazole 40 MG capsule Commonly known as: PRILOSEC Take 40 mg by mouth every other day.   oxyCODONE-acetaminophen 5-325 MG tablet Commonly known as: Percocet 1-2 tabs po q6 hours prn pain   pravastatin 10 MG tablet Commonly known as: PRAVACHOL Take 10 mg by mouth at bedtime.   tadalafil 5 MG tablet Commonly known as: CIALIS Take 1 tablet (5 mg total) by mouth daily.        Allergies:  Allergies  Allergen Reactions   Diclofenac Anaphylaxis   Diclofenac Sodium Anaphylaxis    Topical gel   Levaquin [Levofloxacin In D5w] Anaphylaxis   Lidocaine Anaphylaxis    Family  History: Family History  Problem Relation Age of Onset   Lymphoma Mother    Heart disease Father    Hyperlipidemia Sister    Diabetes Sister    Heart disease Sister    Breast cancer Sister    Hyperlipidemia Brother    Diabetes Brother     Social History:  reports that he has quit smoking. His smoking use included cigars. He has never used smokeless tobacco. He reports that he does not drink alcohol and does not use drugs.  ROS: All other review of systems were reviewed and are negative except what is noted above in HPI  Physical Exam: BP 117/78   Pulse 69   Constitutional:  Alert and oriented, No acute distress. HEENT: [place] AT, moist mucus membranes.  Trachea midline, no masses. Cardiovascular: No clubbing, cyanosis, or edema. Respiratory: Normal respiratory effort, no increased work of breathing. GI: Abdomen is soft, nontender, nondistended, no abdominal masses GU: No CVA tenderness.  Lymph: No cervical or inguinal lymphadenopathy. Skin: No rashes, bruises or suspicious lesions. Neurologic: Grossly intact, no focal deficits, moving all 4 extremities. Psychiatric: Normal mood and affect.  Laboratory Data: Lab Results  Component Value Date   WBC 6.3 08/06/2022   HGB 11.7 (L) 08/06/2022   HCT 35.8 (L) 08/06/2022   MCV 83 08/06/2022   PLT 214 08/06/2022    Lab Results  Component Value Date   CREATININE 2.27 (H) 08/06/2022    Lab Results  Component Value Date   PSA 6.7 (H) 06/22/2019    No results found for: "TESTOSTERONE"  Lab Results  Component Value Date   HGBA1C 7.6 (H) 08/06/2022    Urinalysis    Component Value Date/Time   COLORURINE STRAW (A) 11/15/2016 0617   APPEARANCEUR Clear 04/11/2022 1050   LABSPEC 1.009 11/15/2016 0617   PHURINE 7.0 11/15/2016 0617   GLUCOSEU Negative 04/11/2022 1050   HGBUR NEGATIVE 11/15/2016 0617   BILIRUBINUR Negative 04/11/2022 1050   KETONESUR NEGATIVE 11/15/2016 0617   PROTEINUR Negative 04/11/2022 1050   PROTEINUR  NEGATIVE 11/15/2016 0617   UROBILINOGEN 0.2 06/22/2019 1104   NITRITE Negative 04/11/2022 1050   NITRITE NEGATIVE 11/15/2016 0617   LEUKOCYTESUR Negative 04/11/2022 1050    Lab Results  Component Value Date   LABMICR 3.2 08/06/2022    Pertinent Imaging: *** No results found for this or any previous visit.  No results found for this or any previous visit.  No results found for this or any previous visit.  No results found for this or any previous visit.  Results for orders placed during the hospital encounter of 12/24/20  US RENAL  Narrative CLINICAL DATA:  Chronic kidney disease.  EXAM: RENAL / URINARY TRACT ULTRASOUND COMPLETE  COMPARISON:  None.  FINDINGS: Right Kidney:  Renal measurements: 11.5 x 6.1 x 5.2 cm = volume: 188 mL. There is diffuse increased renal parenchymal echogenicity. No hydronephrosis or shadowing stone. There is a 5 cm upper pole cyst.  Left Kidney:  Renal measurements: 11.9 x 6.6 x 5.4 cm = volume: 222 mL. Normal echogenicity. No hydronephrosis or shadowing stone.  Bladder:  Appears normal for degree of bladder distention.  Other:  None.  IMPRESSION: 1. Echogenic right kidney a 5 cm upper pole cyst. No hydronephrosis or shadowing stone. 2. Unremarkable left kidney and urinary bladder.   Electronically Signed By: Elgie Collard M.D. On: 12/24/2020 23:22  No valid procedures specified. No results found for this or any previous visit.  No results found for this or any previous visit.   Assessment & Plan:    1. Elevated PSA *** - Urinalysis, Routine w reflex microscopic  2. Benign prostatic hyperplasia with urinary obstruction ***  3. Nocturia ***   No follow-ups on file.  Wilkie Aye, MD  [facility] [place]

## 2022-10-27 ENCOUNTER — Encounter: Payer: Self-pay | Admitting: Urology

## 2022-10-27 NOTE — Patient Instructions (Signed)

## 2022-11-07 ENCOUNTER — Ambulatory Visit (INDEPENDENT_AMBULATORY_CARE_PROVIDER_SITE_OTHER): Payer: PPO | Admitting: Internal Medicine

## 2022-11-07 ENCOUNTER — Inpatient Hospital Stay: Payer: PPO | Attending: Hematology

## 2022-11-07 ENCOUNTER — Encounter: Payer: Self-pay | Admitting: Internal Medicine

## 2022-11-07 VITALS — BP 123/75 | HR 68 | Resp 16 | Ht 70.0 in | Wt 245.0 lb

## 2022-11-07 DIAGNOSIS — G4730 Sleep apnea, unspecified: Secondary | ICD-10-CM | POA: Insufficient documentation

## 2022-11-07 DIAGNOSIS — R778 Other specified abnormalities of plasma proteins: Secondary | ICD-10-CM

## 2022-11-07 DIAGNOSIS — E1122 Type 2 diabetes mellitus with diabetic chronic kidney disease: Secondary | ICD-10-CM | POA: Insufficient documentation

## 2022-11-07 DIAGNOSIS — R5383 Other fatigue: Secondary | ICD-10-CM | POA: Diagnosis not present

## 2022-11-07 DIAGNOSIS — I129 Hypertensive chronic kidney disease with stage 1 through stage 4 chronic kidney disease, or unspecified chronic kidney disease: Secondary | ICD-10-CM | POA: Diagnosis not present

## 2022-11-07 DIAGNOSIS — R779 Abnormality of plasma protein, unspecified: Secondary | ICD-10-CM | POA: Diagnosis present

## 2022-11-07 DIAGNOSIS — D631 Anemia in chronic kidney disease: Secondary | ICD-10-CM | POA: Diagnosis not present

## 2022-11-07 DIAGNOSIS — Z87891 Personal history of nicotine dependence: Secondary | ICD-10-CM | POA: Diagnosis not present

## 2022-11-07 DIAGNOSIS — Z806 Family history of leukemia: Secondary | ICD-10-CM | POA: Diagnosis not present

## 2022-11-07 DIAGNOSIS — N1832 Chronic kidney disease, stage 3b: Secondary | ICD-10-CM | POA: Diagnosis not present

## 2022-11-07 DIAGNOSIS — D869 Sarcoidosis, unspecified: Secondary | ICD-10-CM | POA: Insufficient documentation

## 2022-11-07 DIAGNOSIS — H93291 Other abnormal auditory perceptions, right ear: Secondary | ICD-10-CM | POA: Insufficient documentation

## 2022-11-07 DIAGNOSIS — Z7984 Long term (current) use of oral hypoglycemic drugs: Secondary | ICD-10-CM | POA: Insufficient documentation

## 2022-11-07 DIAGNOSIS — K219 Gastro-esophageal reflux disease without esophagitis: Secondary | ICD-10-CM | POA: Diagnosis not present

## 2022-11-07 DIAGNOSIS — Z79899 Other long term (current) drug therapy: Secondary | ICD-10-CM | POA: Insufficient documentation

## 2022-11-07 DIAGNOSIS — N400 Enlarged prostate without lower urinary tract symptoms: Secondary | ICD-10-CM | POA: Diagnosis not present

## 2022-11-07 DIAGNOSIS — D472 Monoclonal gammopathy: Secondary | ICD-10-CM

## 2022-11-07 LAB — COMPREHENSIVE METABOLIC PANEL
ALT: 18 U/L (ref 0–44)
AST: 25 U/L (ref 15–41)
Albumin: 3.8 g/dL (ref 3.5–5.0)
Alkaline Phosphatase: 81 U/L (ref 38–126)
Anion gap: 9 (ref 5–15)
BUN: 27 mg/dL — ABNORMAL HIGH (ref 8–23)
CO2: 25 mmol/L (ref 22–32)
Calcium: 8.8 mg/dL — ABNORMAL LOW (ref 8.9–10.3)
Chloride: 102 mmol/L (ref 98–111)
Creatinine, Ser: 2.33 mg/dL — ABNORMAL HIGH (ref 0.61–1.24)
GFR, Estimated: 29 mL/min — ABNORMAL LOW (ref >60)
Glucose, Bld: 251 mg/dL — ABNORMAL HIGH (ref 70–99)
Potassium: 3.8 mmol/L (ref 3.5–5.1)
Sodium: 136 mmol/L (ref 135–145)
Total Bilirubin: 0.8 mg/dL (ref 0.3–1.2)
Total Protein: 6.9 g/dL (ref 6.5–8.1)

## 2022-11-07 LAB — CBC WITH DIFFERENTIAL/PLATELET
Abs Immature Granulocytes: 0.02 10*3/uL (ref 0.00–0.07)
Basophils Absolute: 0 10*3/uL (ref 0.0–0.1)
Basophils Relative: 1 %
Eosinophils Absolute: 0.2 10*3/uL (ref 0.0–0.5)
Eosinophils Relative: 4 %
HCT: 32.9 % — ABNORMAL LOW (ref 39.0–52.0)
Hemoglobin: 11.3 g/dL — ABNORMAL LOW (ref 13.0–17.0)
Immature Granulocytes: 0 %
Lymphocytes Relative: 30 %
Lymphs Abs: 1.7 10*3/uL (ref 0.7–4.0)
MCH: 28 pg (ref 26.0–34.0)
MCHC: 34.3 g/dL (ref 30.0–36.0)
MCV: 81.6 fL (ref 80.0–100.0)
Monocytes Absolute: 0.5 10*3/uL (ref 0.1–1.0)
Monocytes Relative: 9 %
Neutro Abs: 3.1 10*3/uL (ref 1.7–7.7)
Neutrophils Relative %: 56 %
Platelets: 213 10*3/uL (ref 150–400)
RBC: 4.03 MIL/uL — ABNORMAL LOW (ref 4.22–5.81)
RDW: 12.3 % (ref 11.5–15.5)
WBC: 5.6 10*3/uL (ref 4.0–10.5)
nRBC: 0 % (ref 0.0–0.2)

## 2022-11-07 LAB — IRON AND TIBC
Iron: 80 ug/dL (ref 45–182)
Saturation Ratios: 27 % (ref 17.9–39.5)
TIBC: 299 ug/dL (ref 250–450)
UIBC: 219 ug/dL

## 2022-11-07 LAB — LACTATE DEHYDROGENASE: LDH: 143 U/L (ref 98–192)

## 2022-11-07 LAB — FERRITIN: Ferritin: 79 ng/mL (ref 24–336)

## 2022-11-07 NOTE — Patient Instructions (Signed)
It was a pleasure to see you today.  Thank you for giving [place] the opportunity to be involved in your care.  Below is a brief recap of your visit and next steps.  We will plan to see you again in 6-8 weeks.  Summary I recommend trying fluticasone nasal spray and sudafed for ear discomfort Follow up in 6-8 weeks for routine care

## 2022-11-07 NOTE — Assessment & Plan Note (Signed)
Presenting today for an acute visit endorsing muffled hearing in the right ear.  This seems to be a chronic issue.  Otic exam today is benign.  No evidence of cerumen impaction or infection is present.  He has tried Cortisporin otic drops without symptomatic improvement.  I suspect his symptoms may be related to eustachian tube dysfunction.  I recommended trialing fluticasone and Sudafed to see if this alleviates his symptoms.  He was instructed return to care if his symptoms worsen or fail to improve.  Otherwise, we will arrange 6-8-week follow-up for routine care.

## 2022-11-07 NOTE — Progress Notes (Signed)
Acute Office Visit  Subjective:     Patient ID: Thomas Malone, male    DOB: 09/13/1950, 72 y.o.   MRN: [id number]  Chief Complaint  Patient presents with   Hearing Loss    Wants to get hearing checked. Also wife states he doesn't have any energy for the past couple years   Thomas Malone presents today for an acute visit endorsing muffled hearing in his right ear.  Symptoms have been intermittently present over the last year.  His previous PCP prescribed Cortisporin otic solution, which has alleviated his symptoms previously but most recently has been an effective.  He denies pain in the right ear.  He continues to endorse chronic fatigue.  Review of Systems  Constitutional:  Positive for malaise/fatigue (Chronic fatigue).  HENT:         Muffled hearing in right ear      Objective:    BP 123/75   Pulse 68   Resp 16   Ht 5\' 10"  (1.778 m)   Wt 245 lb (111.1 kg)   SpO2 96%   BMI 35.15 kg/m  BP Readings from Last 3 Encounters:  11/07/22 123/75  10/24/22 117/78  10/07/22 (!) 129/90   Physical Exam HENT:     Right Ear: Hearing, tympanic membrane, ear canal and external ear normal. No drainage. There is no impacted cerumen. Tympanic membrane is not erythematous or bulging.     Left Ear: Hearing, tympanic membrane, ear canal and external ear normal.       Assessment & Plan:   Problem List Items Addressed This Visit       Abnormal auditory perception of right ear - Primary    Presenting today for an acute visit endorsing muffled hearing in the right ear.  This seems to be a chronic issue.  Otic exam today is benign.  No evidence of cerumen impaction or infection is present.  He has tried Cortisporin otic drops without symptomatic improvement.  I suspect his symptoms may be related to eustachian tube dysfunction.  I recommended trialing fluticasone and Sudafed to see if this alleviates his symptoms.  He was instructed return to care if his symptoms worsen or fail to improve.   Otherwise, we will arrange 6-8-week follow-up for routine care.      Return in about 6 weeks (around 12/19/2022).  [name], MD

## 2022-11-10 LAB — KAPPA/LAMBDA LIGHT CHAINS
Kappa free light chain: 47.8 mg/L — ABNORMAL HIGH (ref 3.3–19.4)
Kappa, lambda light chain ratio: 1.09 (ref 0.26–1.65)
Lambda free light chains: 43.8 mg/L — ABNORMAL HIGH (ref 5.7–26.3)

## 2022-11-14 ENCOUNTER — Inpatient Hospital Stay: Payer: PPO | Admitting: Oncology

## 2022-11-14 ENCOUNTER — Inpatient Hospital Stay: Payer: PPO

## 2022-11-14 VITALS — BP 134/85 | HR 72 | Temp 97.9°F | Resp 16 | Wt 248.4 lb

## 2022-11-14 DIAGNOSIS — R778 Other specified abnormalities of plasma proteins: Secondary | ICD-10-CM

## 2022-11-14 DIAGNOSIS — R779 Abnormality of plasma protein, unspecified: Secondary | ICD-10-CM | POA: Diagnosis not present

## 2022-11-14 DIAGNOSIS — D649 Anemia, unspecified: Secondary | ICD-10-CM

## 2022-11-14 LAB — IMMUNOFIXATION ELECTROPHORESIS
IgA: 312 mg/dL (ref 61–437)
IgG (Immunoglobin G), Serum: 1390 mg/dL (ref 603–1613)
IgM (Immunoglobulin M), Srm: 78 mg/dL (ref 15–143)
Total Protein ELP: 6.8 g/dL (ref 6.0–8.5)

## 2022-11-14 LAB — PROTEIN ELECTROPHORESIS, SERUM
A/G Ratio: 1.3 (ref 0.7–1.7)
Albumin ELP: 3.7 g/dL (ref 2.9–4.4)
Alpha-1-Globulin: 0.2 g/dL (ref 0.0–0.4)
Alpha-2-Globulin: 0.5 g/dL (ref 0.4–1.0)
Beta Globulin: 0.9 g/dL (ref 0.7–1.3)
Gamma Globulin: 1.3 g/dL (ref 0.4–1.8)
Globulin, Total: 2.9 g/dL (ref 2.2–3.9)
Total Protein ELP: 6.6 g/dL (ref 6.0–8.5)

## 2022-11-14 LAB — FOLATE: Folate: 16 ng/mL (ref >5.9)

## 2022-11-14 LAB — VITAMIN B12: Vitamin B-12: 877 pg/mL (ref 180–914)

## 2022-11-14 LAB — VITAMIN D 25 HYDROXY (VIT D DEFICIENCY, FRACTURES): Vit D, 25-Hydroxy: 44.09 ng/mL (ref 30–100)

## 2022-11-14 MED ORDER — CYANOCOBALAMIN 1000 MCG/ML IJ SOLN: 1000.0000 ug | INTRAMUSCULAR | 0 refills | Status: DC

## 2022-11-14 MED ORDER — CYANOCOBALAMIN 1000 MCG/ML IJ SOLN
1000.0000 ug | Freq: Once | INTRAMUSCULAR | Status: AC
  Administered 2022-11-14: 1000 ug via INTRAMUSCULAR
  Filled 2022-11-14: qty 1

## 2022-11-14 NOTE — Patient Instructions (Signed)
 [facility]  Discharge Instructions: Thank you for choosing [facility] to provide your oncology and hematology care.  If you have a lab appointment with the Cancer Center - please note that after April 8th, 2024, all labs will be drawn in the cancer center.  You do not have to check in or register with the main entrance as you have in the past but will complete your check-in in the cancer center.  Wear comfortable clothing and clothing appropriate for easy access to any Portacath or PICC line.   We strive to give you quality time with your provider. You may need to reschedule your appointment if you arrive late (15 or more minutes).  Arriving late affects you and other patients whose appointments are after yours.  Also, if you miss three or more appointments without notifying the office, you may be dismissed from the clinic at the provider's discretion.      For prescription refill requests, have your pharmacy contact our office and allow 72 hours for refills to be completed.    Today you received the following chemotherapy and/or immunotherapy agents B12 injection.  Vitamin B12 Injection What is this medication? Vitamin B12 (VAHY tuh min B12) prevents and treats low vitamin B12 levels in your body. It is used in people who do not get enough vitamin B12 from their diet or when their digestive tract does not absorb enough. Vitamin B12 plays an important role in maintaining the health of your nervous system and red blood cells. This medicine may be used for other purposes; ask your health care provider or pharmacist if you have questions. COMMON BRAND NAME(S): B-12 Compliance Kit, B-12 Injection Kit, Cyomin, Dodex, LA-12, Nutri-Twelve, Physicians EZ Use B-12, Primabalt, Vitamin Deficiency Injectable System - B12 What should I tell my care team before I take this medication? They need to know if you have any of these conditions: Kidney disease Leber's  disease Megaloblastic anemia An unusual or allergic reaction to cyanocobalamin, cobalt, other medications, foods, dyes, or preservatives Pregnant or trying to get pregnant Breast-feeding How should I use this medication? This medication is injected into a muscle or deeply under the skin. It is usually given in a clinic or care team's office. However, your care team may teach you how to inject yourself. Follow all instructions. Talk to your care team about the use of this medication in children. Special care may be needed. Overdosage: If you think you have taken too much of this medicine contact a poison control center or emergency room at once. NOTE: This medicine is only for you. Do not share this medicine with others. What if I miss a dose? If you are given your dose at a clinic or care team's office, call to reschedule your appointment. If you give your own injections, and you miss a dose, take it as soon as you can. If it is almost time for your next dose, take only that dose. Do not take double or extra doses. What may interact with this medication? Alcohol Colchicine This list may not describe all possible interactions. Give your health care provider a list of all the medicines, herbs, non-prescription drugs, or dietary supplements you use. Also tell them if you smoke, drink alcohol, or use illegal drugs. Some items may interact with your medicine. What should I watch for while using this medication? Visit your care team regularly. You may need blood work done while you are taking this medication. You may need  to follow a special diet. Talk to your care team. Limit your alcohol intake and avoid smoking to get the best benefit. What side effects may I notice from receiving this medication? Side effects that you should report to your care team as soon as possible: Allergic reactions--skin rash, itching, hives, swelling of the face, lips, tongue, or throat Swelling of the ankles, hands, or  feet Trouble breathing Side effects that usually do not require medical attention (report to your care team if they continue or are bothersome): Diarrhea This list may not describe all possible side effects. Call your doctor for medical advice about side effects. You may report side effects to FDA at 1-800-FDA-1088. Where should I keep my medication? Keep out of the reach of children. Store at room temperature between 15 and 30 degrees C (59 and 85 degrees F). Protect from light. Throw away any unused medication after the expiration date. NOTE: This sheet is a summary. It may not cover all possible information. If you have questions about this medicine, talk to your doctor, pharmacist, or health care provider.  2024 Elsevier/Gold Standard (2020-10-02 00:00:00)       To help prevent nausea and vomiting after your treatment, we encourage you to take your nausea medication as directed.  BELOW ARE SYMPTOMS THAT SHOULD BE REPORTED IMMEDIATELY: *FEVER GREATER THAN 100.4 F (38 C) OR HIGHER *CHILLS OR SWEATING *NAUSEA AND VOMITING THAT IS NOT CONTROLLED WITH YOUR NAUSEA MEDICATION *UNUSUAL SHORTNESS OF BREATH *UNUSUAL BRUISING OR BLEEDING *URINARY PROBLEMS (pain or burning when urinating, or frequent urination) *BOWEL PROBLEMS (unusual diarrhea, constipation, pain near the anus) TENDERNESS IN MOUTH AND THROAT WITH OR WITHOUT PRESENCE OF ULCERS (sore throat, sores in mouth, or a toothache) UNUSUAL RASH, SWELLING OR PAIN  UNUSUAL VAGINAL DISCHARGE OR ITCHING   Items with * indicate a potential emergency and should be followed up as soon as possible or go to the Emergency Department if any problems should occur.  Please show the CHEMOTHERAPY ALERT CARD or IMMUNOTHERAPY ALERT CARD at check-in to the Emergency Department and triage nurse.  Should you have questions after your visit or need to cancel or reschedule your appointment, please contact [facility] [phone]  and  follow the prompts.  Office hours are 8:00 a.m. to 4:30 p.m. Monday - Friday. Please note that voicemails left after 4:00 p.m. may not be returned until the following business day.  We are closed weekends and major holidays. You have access to a nurse at all times for urgent questions. Please call the main number to the clinic [phone] and follow the prompts.  For any non-urgent questions, you may also contact your provider using MyChart. We now offer e-Visits for anyone 83 and older to request care online for non-urgent symptoms. For details visit [email or web address].   Also download the MyChart app! Go to the app store, search "MyChart", open the app, select [facility], and log in with your MyChart username and password.

## 2022-11-14 NOTE — Progress Notes (Signed)
[facility] [address] [place], [place] [address]   CLINIC:  Medical Oncology/Hematology  PCP:  [name], MD [address] [place] [address] [phone]   REASON FOR VISIT:  Follow-up for abnormal SPEP  CURRENT THERAPY: Surveillance  INTERVAL HISTORY:   [name] 72 y.o. male returns for routine follow-up of abnormal SPEP.  He was last seen by Rojelio Brenner PA-C on 05/16/22.   He was evaluated at [facility] for 3 cm laceration to his left hand on 07/05/2022.  He required 5 sutures.   Had submuscular transposition of right ulnar nerve on 08/28/2022 with Dr. Merlyn Lot.  No postop complications.  He is followed by urology for elevated PSA, BPH and erectile dysfunction.  Patient was referred to Dr. Stanford Breed for penile implant.  Reports overall he is doing well.  Appetite is at 100% energy levels are 50%.  Reports feeling easily fatigued especially with ambulation.  States he can fall asleep quickly if he sits or is watching television.  States this is not always been the case and he is unsure of why.  Denies any bleeding, bright red blood per rectum, melena or hematochezia.  Reports Dr. Wolfgang Phoenix instructed him to reduce the amount of salt he was eating.  Reports over the past 6 months he is no longer adding salt to any of his foods nor drinking sodas.  He is only drinking water.  He denies any hospitalizations, surgeries or changes to his baseline health.  Denies any bone pain or B symptoms.   ASSESSMENT & PLAN:  1.  Abnormal SPEP - Referred by Dr. Wolfgang Phoenix due to labs from 12/20/2020 and 04/12/2021 showing a poorly defined band of restricted protein and gammaglobulin regions on SPEP/IFE.  Urine IFE showed poorly defined area of restricted protein mobility detected and is reactive with kappa light chain antisera. - Hematology work-up (10/21/2021): SPEP and immunofixation negative for monoclonal protein. Urine immunofixation unremarkable, no  evidence of monoclonal protein. Elevated kappa light chains 42.1 with lambda light chains elevated at 35.4 and normal ratio 1.19.  This is in keeping with patient's CKD stage IIIb. Mildly elevated beta-2 microglobulin 2.6.  Normal LDH. No evidence of CRAB features on CBC/CMP.  (Baseline anemia with Hgb 11.7, baseline CKD stage IIIb with creatinine 1.88, calcium 8.9) - MGUS/myeloma panel (05/09/2022): Immunofixation negative. SPEP normal. Stable elevation in kappa light chain 58.4, elevated lambda light chain 46.3, normal kappa/lambda ratio 1.26.  This is in keeping with patient CKD stage IIIb. LDH normal. Creatinine 2.41, somewhat worsened from baseline.  Normal calcium 9.1.  Mild anemia with Hgb 11.7/MCV 82.9. 24-hour urine shows elevated light chains, but immunofixation pattern is unremarkable and M spike is negative.  Plan: Abnormal SPEP: -Most recent labs 11/07/2022 showed calcium level of 8.8, creatinine 2.33, hemoglobin 11.3 with a normal differential. -SPEP and immunofixation is pending.  Kappa lambda light chains are elevated but stable.  Kappa lambda light chain ratio is WNL. - No new bone pain, B symptoms, or neurologic changes. -Recheck UPEP in 6 months. -If he develops an M spike either in urine or blood would recommend skeletal survey and bone marrow biopsy if indicated. -RTC in 6 months with labs a few days before and office visit.  2.  Anemia: - Likely multifactorial with relative iron deficiency and CKD. -Patient is symptomatic with fatigue.  Hemoglobin is 11.3 (11.7). -Will add additional labs today including vitamin B12, MMA, vitamin D, folate and copper levels. -Most  recent iron levels show ferritin of 79 and iron saturation of 27%. -We discussed starting iron tabs 325 mg ferrous sulfate daily and we will recheck iron levels in 6 months. -Discussed B12 injections will give 1 today while in clinic and check lab work today to see if he needs these monthly.  PLAN  SUMMARY: >> B12 injection today while in clinic. >> Add on labs-will call with results. >> Discussed monthly B12 injections if warranted. >> Return to clinic in 6 months with labs a few days before and follow-up.    REVIEW OF SYSTEMS:   Review of Systems  Constitutional:  Positive for fatigue.  Respiratory:  Positive for shortness of breath.   Neurological:  Positive for dizziness.  Psychiatric/Behavioral:  Positive for sleep disturbance.      PHYSICAL EXAM:  ECOG PERFORMANCE STATUS: 1 - Symptomatic but completely ambulatory  There were no vitals filed for this visit. There were no vitals filed for this visit. Physical Exam Constitutional:      Appearance: Normal appearance.  Cardiovascular:     Rate and Rhythm: Normal rate and regular rhythm.  Pulmonary:     Effort: Pulmonary effort is normal.     Breath sounds: Normal breath sounds.  Abdominal:     General: Bowel sounds are normal.     Palpations: Abdomen is soft.  Musculoskeletal:        General: No swelling. Normal range of motion.  Neurological:     Mental Status: He is alert and oriented to person, place, and time. Mental status is at baseline.     PAST MEDICAL/SURGICAL HISTORY:  Past Medical History:  Diagnosis Date   Abnormal CT of the chest 01/23/2014   BPH (benign prostatic hyperplasia) 05/31/2018   Complication of anesthesia    hard to wake up    Diabetes mellitus without complication (HCC)    Elevated PSA    Erectile dysfunction    Gastroesophageal reflux    H/O sarcoidosis    Headache    7-8 months from gabapentin and naproxen   Hypertension    Pneumonia of upper lobe of lung 12/1213   Sleep apnea    Past Surgical History:  Procedure Laterality Date   ANTERIOR CERVICAL DECOMP/DISCECTOMY FUSION N/A 05/28/2018   CATARACT EXTRACTION W/PHACO Left 04/11/2019   Procedure: CATARACT EXTRACTION PHACO AND INTRAOCULAR LENS PLACEMENT (IOC) (CDE:5.80);  Surgeon: Fabio Pierce, MD;  Location: [facility];   Service: Ophthalmology;  Laterality: Left;   CATARACT EXTRACTION W/PHACO Right 06/03/2019   Procedure: CATARACT EXTRACTION PHACO AND INTRAOCULAR LENS PLACEMENT RIGHT EYE;  Surgeon: Fabio Pierce, MD;  Location: [facility];  Service: Ophthalmology;  Laterality: Right;  CDE: 9.82   COLONOSCOPY     COLONOSCOPY N/A 06/23/2014   Procedure: COLONOSCOPY;  Surgeon: Franky Macho Md, MD;  Location: [facility];  Service: Gastroenterology;  Laterality: N/A;   ELBOW SURGERY Right    2021   EYE SURGERY Bilateral    lasic   ORTHOPEDIC SURGERY Left    heel surgery   PROSTATE BIOPSY     SHOULDER ARTHROSCOPY Right    repair of tendons and ligaments   TONSILLECTOMY     ULNAR NERVE TRANSPOSITION Right 08/28/2022   Procedure: RIGHT ULNAR NERVE DECOMPRESSION WITH TRANSPOSITION POSSIBLE SUBMUSCULAR TRANSPOSITION;  Surgeon: Betha Loa, MD;  Location: [facility];  Service: Orthopedics;  Laterality: Right;  120 MIN   WRIST SURGERY     2021    SOCIAL HISTORY:  Social History   Socioeconomic  History   Marital status: Married    Spouse name: Not on file   Number of children: 2   Years of education: Not on file   Highest education level: Not on file  Occupational History   Occupation: concrete finisher  Tobacco Use   Smoking status: Former    Types: Cigars   Smokeless tobacco: Never   Tobacco comments:    and pipe-none since 2010  Vaping Use   Vaping status: Never Used  Substance and Sexual Activity   Alcohol use: No   Drug use: No   Sexual activity: Yes    Birth control/protection: None  Other Topics Concern   Not on file  Social History Narrative   Not on file   Social Determinants of Health   Financial Resource Strain: Low Risk  (10/07/2022)   Overall Financial Resource Strain (CARDIA)    Difficulty of Paying Living Expenses: Not hard at all  Food Insecurity: No Food Insecurity (10/07/2022)   Hunger Vital Sign    Worried About Running Out of Food in the Last Year: Never  true    Ran Out of Food in the Last Year: Never true  Transportation Needs: No Transportation Needs (10/07/2022)   PRAPARE - Administrator, Civil Service (Medical): No    Lack of Transportation (Non-Medical): No  Physical Activity: Sufficiently Active (10/07/2022)   Exercise Vital Sign    Days of Exercise per Week: 7 days    Minutes of Exercise per Session: 30 min  Stress: No Stress Concern Present (10/07/2022)   [employer] of Occupational Health - Occupational Stress Questionnaire    Feeling of Stress : Not at all  Social Connections: Moderately Integrated (10/07/2022)   Social Connection and Isolation Panel [NHANES]    Frequency of Communication with Friends and Family: More than three times a week    Frequency of Social Gatherings with Friends and Family: More than three times a week    Attends Religious Services: More than 4 times per year    Active Member of [employer] or Organizations: No    Attends Banker Meetings: Never    Marital Status: Married  Catering manager Violence: Not At Risk (10/07/2022)   Humiliation, Afraid, Rape, and Kick questionnaire    Fear of Current or Ex-Partner: No    Emotionally Abused: No    Physically Abused: No    Sexually Abused: No    FAMILY HISTORY:  Family History  Problem Relation Age of Onset   Lymphoma Mother    Heart disease Father    Hyperlipidemia Sister    Diabetes Sister    Heart disease Sister    Breast cancer Sister    Hyperlipidemia Brother    Diabetes Brother     CURRENT MEDICATIONS:  Outpatient Encounter Medications as of 11/14/2022  Medication Sig   alfuzosin (UROXATRAL) 10 MG 24 hr tablet Take 1 tablet (10 mg total) by mouth daily with breakfast.   amLODipine (NORVASC) 10 MG tablet Take 5 mg by mouth daily.   chlorthalidone (HYGROTON) 25 MG tablet Take 25 mg by mouth daily.   finasteride (PROSCAR) 5 MG tablet Take 1 tablet (5 mg total) by mouth daily.   FREESTYLE LITE test strip SMARTSIG:Via Meter    glipiZIDE (GLUCOTROL) 5 MG tablet Take 5 mg by mouth every morning.   Glucosamine-Chondroitin (COSAMIN DS PO) Take 1 tablet by mouth daily.   irbesartan (AVAPRO) 150 MG tablet TAKE ONE TABLET (150MG  TOTAL) BY MOUTH DAILY  Lancets (FREESTYLE) lancets 1 each by Other route 2 (two) times daily.   Multiple Vitamin (MULTIVITAMIN WITH MINERALS) TABS tablet Take 1 tablet by mouth daily.   omeprazole (PRILOSEC) 40 MG capsule Take 40 mg by mouth every other day.    oxyCODONE-acetaminophen (PERCOCET) 5-325 MG tablet 1-2 tabs po q6 hours prn pain   pravastatin (PRAVACHOL) 10 MG tablet Take 10 mg by mouth at bedtime.   tadalafil (CIALIS) 5 MG tablet Take 1 tablet (5 mg total) by mouth daily.   No facility-administered encounter medications on file as of 11/14/2022.    ALLERGIES:  Allergies  Allergen Reactions   Diclofenac Anaphylaxis   Diclofenac Sodium Anaphylaxis    Topical gel   Levaquin [Levofloxacin In D5w] Anaphylaxis   Lidocaine Anaphylaxis    LABORATORY DATA:  I have reviewed the labs as listed.  CBC    Component Value Date/Time   WBC 5.6 11/07/2022 1055   RBC 4.03 (L) 11/07/2022 1055   HGB 11.3 (L) 11/07/2022 1055   HGB 11.7 (L) 08/06/2022 0841   HCT 32.9 (L) 11/07/2022 1055   HCT 35.8 (L) 08/06/2022 0841   PLT 213 11/07/2022 1055   PLT 214 08/06/2022 0841   MCV 81.6 11/07/2022 1055   MCV 83 08/06/2022 0841   MCH 28.0 11/07/2022 1055   MCHC 34.3 11/07/2022 1055   RDW 12.3 11/07/2022 1055   RDW 12.1 08/06/2022 0841   LYMPHSABS 1.7 11/07/2022 1055   LYMPHSABS 1.7 08/06/2022 0841   MONOABS 0.5 11/07/2022 1055   EOSABS 0.2 11/07/2022 1055   EOSABS 0.3 08/06/2022 0841   BASOSABS 0.0 11/07/2022 1055   BASOSABS 0.0 08/06/2022 0841      Latest Ref Rng & Units 11/07/2022   10:55 AM 08/06/2022    8:41 AM 06/09/2022   12:20 PM  CMP  Glucose 70 - 99 mg/dL 161  096  045   BUN 8 - 23 mg/dL 27  22  21    Creatinine 0.61 - 1.24 mg/dL 4.09  8.11  9.14   Sodium 135 - 145 mmol/L  136  141  139   Potassium 3.5 - 5.1 mmol/L 3.8  3.9  4.5   Chloride 98 - 111 mmol/L 102  105  104   CO2 22 - 32 mmol/L 25  23  26    Calcium 8.9 - 10.3 mg/dL 8.8  9.5  9.0   Total Protein 6.5 - 8.1 g/dL 6.9  6.9    Total Bilirubin 0.3 - 1.2 mg/dL 0.8  0.6    Alkaline Phos 38 - 126 U/L 81  95    AST 15 - 41 U/L 25  18    ALT 0 - 44 U/L 18  10      DIAGNOSTIC IMAGING:  I have independently reviewed the relevant imaging and discussed with the patient.   WRAP UP:  All questions were answered. The patient knows to call the clinic with any problems, questions or concerns.  Medical decision making: mod  Time spent on visit: I spent 25 minutes dedicated to the care of this patient (face-to-face and non-face-to-face) on the date of the encounter to include what is described in the assessment and plan.  Mauro Kaufmann, NP  11/14/22 10:34 AM

## 2022-11-14 NOTE — Progress Notes (Signed)
Thomas Malone presents today for injection per the provider's orders.  B12 administration without incident; injection site WNL; see MAR for injection details.  Patient tolerated procedure well and without incident.  No questions or complaints noted at this time. Discharged from clinic ambulatory in stable condition. Alert and oriented x 3. F/U with [facility] as scheduled.

## 2022-11-16 ENCOUNTER — Encounter: Payer: Self-pay | Admitting: Oncology

## 2022-11-17 ENCOUNTER — Other Ambulatory Visit: Payer: Self-pay | Admitting: *Deleted

## 2022-11-17 MED ORDER — "BD DISP NEEDLES 25G X 5/8"" MISC": 0 refills | Status: DC

## 2022-11-18 LAB — COPPER, SERUM: Copper: 77 ug/dL (ref 69–132)

## 2022-11-20 LAB — METHYLMALONIC ACID, SERUM: Methylmalonic Acid, Quantitative: 349 nmol/L (ref 0–378)

## 2022-11-25 ENCOUNTER — Other Ambulatory Visit: Payer: Self-pay | Admitting: Physician Assistant

## 2022-11-25 MED ORDER — "BD DISP NEEDLES 25G X 5/8"" MISC": 0 refills | Status: DC

## 2022-12-22 ENCOUNTER — Ambulatory Visit: Payer: PPO | Admitting: Internal Medicine

## 2022-12-22 ENCOUNTER — Encounter: Payer: Self-pay | Admitting: Hematology

## 2022-12-22 ENCOUNTER — Encounter: Payer: Self-pay | Admitting: Internal Medicine

## 2022-12-22 VITALS — BP 131/83 | HR 74 | Ht 70.0 in | Wt 248.8 lb

## 2022-12-22 DIAGNOSIS — I10 Essential (primary) hypertension: Secondary | ICD-10-CM

## 2022-12-22 DIAGNOSIS — E1169 Type 2 diabetes mellitus with other specified complication: Secondary | ICD-10-CM | POA: Diagnosis not present

## 2022-12-22 DIAGNOSIS — R600 Localized edema: Secondary | ICD-10-CM

## 2022-12-22 DIAGNOSIS — K219 Gastro-esophageal reflux disease without esophagitis: Secondary | ICD-10-CM

## 2022-12-22 DIAGNOSIS — N184 Chronic kidney disease, stage 4 (severe): Secondary | ICD-10-CM

## 2022-12-22 DIAGNOSIS — Z23 Encounter for immunization: Secondary | ICD-10-CM | POA: Diagnosis not present

## 2022-12-22 DIAGNOSIS — D649 Anemia, unspecified: Secondary | ICD-10-CM

## 2022-12-22 DIAGNOSIS — E1122 Type 2 diabetes mellitus with diabetic chronic kidney disease: Secondary | ICD-10-CM

## 2022-12-22 DIAGNOSIS — E785 Hyperlipidemia, unspecified: Secondary | ICD-10-CM

## 2022-12-22 DIAGNOSIS — Z7984 Long term (current) use of oral hypoglycemic drugs: Secondary | ICD-10-CM

## 2022-12-22 MED ORDER — OMEPRAZOLE 40 MG PO CPDR: 40.0000 mg | DELAYED_RELEASE_CAPSULE | ORAL | 3 refills | Status: DC

## 2022-12-22 MED ORDER — IRBESARTAN 150 MG PO TABS
150.0000 mg | ORAL_TABLET | Freq: Every day | ORAL | 3 refills | Status: DC
Start: 2022-12-22 — End: 2023-11-23

## 2022-12-22 MED ORDER — PRAVASTATIN SODIUM 20 MG PO TABS: 20.0000 mg | ORAL_TABLET | Freq: Every day | ORAL | 3 refills | Status: DC

## 2022-12-22 NOTE — Assessment & Plan Note (Signed)
Reports today that lower extremity edema transiently improved after evaluation in July, but recently returned after starting B12 injections.  Overall, symptoms are improving.

## 2022-12-22 NOTE — Assessment & Plan Note (Signed)
Symptoms remain adequately controlled with omeprazole.  Refill provided today.

## 2022-12-22 NOTE — Assessment & Plan Note (Signed)
Normocytic anemia noted on recent labs.  He has started oral iron supplementation and monthly B12 injections for relative deficiencies.  He states that symptoms of fatigue have improved.

## 2022-12-22 NOTE — Assessment & Plan Note (Signed)
Influenza vaccine administered today.

## 2022-12-22 NOTE — Progress Notes (Signed)
Established Patient Office Visit  Subjective   Patient ID: Thomas Malone, male    DOB: 1950-08-20  Age: 72 y.o. MRN: [id number]  Chief Complaint  Patient presents with   Follow-up    Follow up   Mr. Kortz returns to care today for follow-up.  He was evaluated by me on 7/3 as a new patient presenting to establish care.  At that time he endorsed bilateral lower extremity edema and chronic fatigue.  Baseline labs were ordered and 6-week follow-up was recommended for lab review, BP check, and reassessment of lower extremity edema.  In the interim, he underwent right ulnar nerve decompression with submuscular transposition on 7/25 with Dr. Merlyn Lot.  He has been seen by urology and oncology for follow-up.  Evaluated by me on 10/4 for an acute visit endorsing muffled hearing in his right ear.  Eustachian tube dysfunction suspected.  Routine use of fluticasone and Sudafed recommended.  There have otherwise been no acute interval events. Mr. Winkfield reports feeling fairly well today.  Fatigue has improved.  He does not have any acute concerns to discuss today.  Past Medical History:  Diagnosis Date   Abnormal CT of the chest 01/23/2014   BPH (benign prostatic hyperplasia) 05/31/2018   Complication of anesthesia    hard to wake up    Diabetes mellitus without complication (HCC)    Elevated PSA    Erectile dysfunction    Gastroesophageal reflux    H/O sarcoidosis    Headache    7-8 months from gabapentin and naproxen   Hypertension    Pneumonia of upper lobe of lung 12/1213   Sleep apnea    Past Surgical History:  Procedure Laterality Date   ANTERIOR CERVICAL DECOMP/DISCECTOMY FUSION N/A 05/28/2018   CATARACT EXTRACTION W/PHACO Left 04/11/2019   Procedure: CATARACT EXTRACTION PHACO AND INTRAOCULAR LENS PLACEMENT (IOC) (CDE:5.80);  Surgeon: Fabio Pierce, MD;  Location: [facility];  Service: Ophthalmology;  Laterality: Left;   CATARACT EXTRACTION W/PHACO Right 06/03/2019   Procedure: CATARACT  EXTRACTION PHACO AND INTRAOCULAR LENS PLACEMENT RIGHT EYE;  Surgeon: Fabio Pierce, MD;  Location: [facility];  Service: Ophthalmology;  Laterality: Right;  CDE: 9.82   COLONOSCOPY     COLONOSCOPY N/A 06/23/2014   Procedure: COLONOSCOPY;  Surgeon: Franky Macho Md, MD;  Location: [facility];  Service: Gastroenterology;  Laterality: N/A;   ELBOW SURGERY Right    2021   EYE SURGERY Bilateral    lasic   ORTHOPEDIC SURGERY Left    heel surgery   PROSTATE BIOPSY     SHOULDER ARTHROSCOPY Right    repair of tendons and ligaments   TONSILLECTOMY     ULNAR NERVE TRANSPOSITION Right 08/28/2022   Procedure: RIGHT ULNAR NERVE DECOMPRESSION WITH TRANSPOSITION POSSIBLE SUBMUSCULAR TRANSPOSITION;  Surgeon: Betha Loa, MD;  Location: [facility];  Service: Orthopedics;  Laterality: Right;  120 MIN   WRIST SURGERY     2021   Social History   Tobacco Use   Smoking status: Former    Types: Cigars   Smokeless tobacco: Never   Tobacco comments:    and pipe-none since 2010  Vaping Use   Vaping status: Never Used  Substance Use Topics   Alcohol use: No   Drug use: No   Family History  Problem Relation Age of Onset   Lymphoma Mother    Heart disease Father    Hyperlipidemia Sister    Diabetes Sister    Heart disease Sister  Breast cancer Sister    Hyperlipidemia Brother    Diabetes Brother    Allergies  Allergen Reactions   Diclofenac Anaphylaxis   Diclofenac Sodium Anaphylaxis    Topical gel   Levaquin [Levofloxacin In D5w] Anaphylaxis   Lidocaine Anaphylaxis   Review of Systems  Constitutional:  Negative for chills and fever.  HENT:  Negative for sore throat.   Respiratory:  Negative for cough and shortness of breath.   Cardiovascular:  Negative for chest pain, palpitations and leg swelling.  Gastrointestinal:  Negative for abdominal pain, blood in stool, constipation, diarrhea, nausea and vomiting.  Genitourinary:  Negative for dysuria and hematuria.   Musculoskeletal:  Negative for myalgias.  Skin:  Negative for itching and rash.  Neurological:  Negative for dizziness and headaches.  Psychiatric/Behavioral:  Negative for depression and suicidal ideas.      Objective:     BP 131/83 (BP Location: Right Arm, Patient Position: Sitting, Cuff Size: Large)   Pulse 74   Ht 5\' 10"  (1.778 m)   Wt 248 lb 12.8 oz (112.9 kg)   SpO2 92%   BMI 35.70 kg/m  BP Readings from Last 3 Encounters:  12/22/22 131/83  11/14/22 134/85  11/07/22 123/75   Physical Exam Vitals reviewed.  Constitutional:      General: He is not in acute distress.    Appearance: Normal appearance. He is obese. He is not ill-appearing.  HENT:     Head: Normocephalic and atraumatic.     Right Ear: External ear normal.     Left Ear: External ear normal.     Nose: Nose normal. No congestion or rhinorrhea.     Mouth/Throat:     Mouth: Mucous membranes are moist.     Pharynx: Oropharynx is clear.  Eyes:     General: No scleral icterus.    Extraocular Movements: Extraocular movements intact.     Conjunctiva/sclera: Conjunctivae normal.     Pupils: Pupils are equal, round, and reactive to light.  Cardiovascular:     Rate and Rhythm: Normal rate and regular rhythm.     Pulses: Normal pulses.     Heart sounds: Normal heart sounds. No murmur heard. Pulmonary:     Effort: Pulmonary effort is normal.     Breath sounds: Normal breath sounds. No wheezing, rhonchi or rales.  Abdominal:     General: Abdomen is flat. Bowel sounds are normal. There is no distension.     Palpations: Abdomen is soft.     Tenderness: There is no abdominal tenderness.  Musculoskeletal:        General: Swelling present. No deformity. Normal range of motion.     Cervical back: Normal range of motion.     Right lower leg: Edema present.     Left lower leg: Edema present.  Skin:    General: Skin is warm and dry.     Capillary Refill: Capillary refill takes less than 2 seconds.  Neurological:      General: No focal deficit present.     Mental Status: He is alert and oriented to person, place, and time.     Motor: No weakness.  Psychiatric:        Mood and Affect: Mood normal.        Behavior: Behavior normal.        Thought Content: Thought content normal.   Last CBC Lab Results  Component Value Date   WBC 5.6 11/07/2022   HGB 11.3 (L) 11/07/2022   HCT  32.9 (L) 11/07/2022   MCV 81.6 11/07/2022   MCH 28.0 11/07/2022   RDW 12.3 11/07/2022   PLT 213 11/07/2022   Last metabolic panel Lab Results  Component Value Date   GLUCOSE 251 (H) 11/07/2022   NA 136 11/07/2022   K 3.8 11/07/2022   CL 102 11/07/2022   CO2 25 11/07/2022   BUN 27 (H) 11/07/2022   CREATININE 2.33 (H) 11/07/2022   GFRNONAA 29 (L) 11/07/2022   CALCIUM 8.8 (L) 11/07/2022   PROT 6.9 11/07/2022   ALBUMIN 3.8 11/07/2022   LABGLOB 2.9 11/07/2022   AGRATIO 1.3 11/07/2022   BILITOT 0.8 11/07/2022   ALKPHOS 81 11/07/2022   AST 25 11/07/2022   ALT 18 11/07/2022   ANIONGAP 9 11/07/2022   Last lipids Lab Results  Component Value Date   CHOL 153 08/06/2022   HDL 40 08/06/2022   LDLCALC 93 08/06/2022   TRIG 106 08/06/2022   CHOLHDL 3.8 08/06/2022   Last hemoglobin A1c Lab Results  Component Value Date   HGBA1C 7.6 (H) 08/06/2022   Last thyroid functions Lab Results  Component Value Date   TSH 2.120 08/06/2022   Last vitamin D Lab Results  Component Value Date   VD25OH 44.09 11/14/2022   Last vitamin B12 and Folate Lab Results  Component Value Date   VITAMINB12 877 11/14/2022   FOLATE 16.0 11/14/2022   The 10-year ASCVD risk score (Arnett DK, et al., 2019) is: 34.9%    Assessment & Plan:   Problem List Items Addressed This Visit       Essential hypertension (Chronic)    Remains adequately controlled on current antihypertensive regimen.  No medication changes are indicated today.  Irbesartan refilled.      GERD (gastroesophageal reflux disease)    Symptoms remain adequately  controlled with omeprazole.  Refill provided today.      Type 2 diabetes mellitus with diabetic chronic kidney disease (HCC)    A1c 7.6 on labs from July.  He is currently prescribed glipizide 5 mg daily. -No medication changes today.  Lifestyle modifications aimed at weight loss and improving his blood sugar were reviewed today, namely dietary changes.  Repeat A1c at follow-up in 3 months.      Hyperlipidemia associated with type 2 diabetes mellitus (HCC) - Primary    Lipid panel updated in July.  Total cholesterol 153 and LDL 93.  He is currently prescribed pravastatin 10 mg daily. -Increase pravastatin to 20 mg daily.  Repeat lipid panel at follow-up in 3 months.      Bilateral lower extremity edema    Reports today that lower extremity edema transiently improved after evaluation in July, but recently returned after starting B12 injections.  Overall, symptoms are improving.      Anemia    Normocytic anemia noted on recent labs.  He has started oral iron supplementation and monthly B12 injections for relative deficiencies.  He states that symptoms of fatigue have improved.      Need for influenza vaccination    Influenza vaccine administered today       Return in about 3 months (around 03/24/2023).    [name], MD

## 2022-12-22 NOTE — Assessment & Plan Note (Signed)
Lipid panel updated in July.  Total cholesterol 153 and LDL 93.  He is currently prescribed pravastatin 10 mg daily. -Increase pravastatin to 20 mg daily.  Repeat lipid panel at follow-up in 3 months.

## 2022-12-22 NOTE — Assessment & Plan Note (Addendum)
A1c 7.6 on labs from July.  He is currently prescribed glipizide 5 mg daily. -No medication changes today.  Lifestyle modifications aimed at weight loss and improving his blood sugar were reviewed today, namely dietary changes.  Repeat A1c at follow-up in 3 months.

## 2022-12-22 NOTE — Patient Instructions (Signed)
It was a pleasure to see you today.  Thank you for giving [place] the opportunity to be involved in your care.  Below is a brief recap of your visit and next steps.  We will plan to see you again in 3 months.  Summary Increase pravastatin to 20 mg daily Irbesartan and omeprazole refilled Flu shot today Follow up in 3 months

## 2022-12-22 NOTE — Assessment & Plan Note (Signed)
Remains adequately controlled on current antihypertensive regimen.  No medication changes are indicated today.  Irbesartan refilled.

## 2023-02-02 ENCOUNTER — Encounter: Payer: Self-pay | Admitting: Internal Medicine

## 2023-02-02 ENCOUNTER — Other Ambulatory Visit: Payer: Self-pay

## 2023-02-03 MED ORDER — TADALAFIL 5 MG PO TABS: 5.0000 mg | ORAL_TABLET | Freq: Every day | ORAL | 3 refills | Status: DC

## 2023-02-10 ENCOUNTER — Ambulatory Visit: Payer: PPO | Admitting: Internal Medicine

## 2023-02-13 ENCOUNTER — Encounter: Payer: Self-pay | Admitting: Internal Medicine

## 2023-02-13 ENCOUNTER — Ambulatory Visit: Payer: PPO | Attending: Internal Medicine | Admitting: Internal Medicine

## 2023-02-13 VITALS — BP 120/72 | HR 68 | Ht 69.5 in | Wt 248.0 lb

## 2023-02-13 DIAGNOSIS — I10 Essential (primary) hypertension: Secondary | ICD-10-CM

## 2023-02-13 NOTE — Patient Instructions (Signed)
Medication Instructions:  Your physician recommends that you continue on your current medications as directed. Please refer to the Current Medication list given to you today.  *If you need a refill on your cardiac medications before your next appointment, please call your pharmacy*   Lab Work: None If you have labs (blood work) drawn today and your tests are completely normal, you will receive your results only by: MyChart Message (if you have MyChart) OR A paper copy in the mail If you have any lab test that is abnormal or we need to change your treatment, we will call you to review the results.   Testing/Procedures: None   Follow-Up: At [facility], you and your health needs are our priority.  As part of our continuing mission to provide you with exceptional heart care, we have created designated Provider Care Teams.  These Care Teams include your primary Cardiologist (physician) and Advanced Practice Providers (APPs -  Physician Assistants and Nurse Practitioners) who all work together to provide you with the care you need, when you need it.  We recommend signing up for the patient portal called "MyChart".  Sign up information is provided on this After Visit Summary.  MyChart is used to connect with patients for Virtual Visits (Telemedicine).  Patients are able to view lab/test results, encounter notes, upcoming appointments, etc.  Non-urgent messages can be sent to your provider as well.   To learn more about what you can do with MyChart, go to [email or web address].    Your next appointment:    Follow up as needed.   Provider:   You may see Vishnu P Mallipeddi, MD or one of the following Advanced Practice Providers on your designated Care Team:   [place], PA-C  Jacolyn Reedy, [place]     Other Instructions

## 2023-02-13 NOTE — Progress Notes (Signed)
 Cardiology Office Note  Date: 02/13/2023   ID: Greig, Altergott 08/15/1950, MRN [id number]  PCP:  Melvenia Manus BRAVO, MD  Cardiologist:  Diannah SHAUNNA Maywood, MD Electrophysiologist:  None    History of Present Illness: Thomas Malone is a 73 y.o. male known to have HTN, DM2, remote history of pulmonary sarcoidosis (follows Dr. Darlean), OSA on CPAP presented to cardiology clinic for follow-up visit.  Patient was initially referred to cardiology clinic in 04/2021 for DOE. Notably, he had been sedentary for about 4 years after work injury started to have significant SOB when he started trying to exert himself again. TTE in 05/2021 showed LVEF 60 to 65%, G1 DD, no significant valve disease. NM SPECT 04/2021 was low risk with normal EF and likely inferior artifact.  He is here for follow-up visit.  He continues to have DOE only with overexertion but able to perform household chores and normal activities.  He does not have any exercise regimen set daily.  No other symptoms of chest pain/angina, dizziness, syncope, palpitations or leg swelling.  Compliant with CPAP.   Past Medical History:  Diagnosis Date   Abnormal CT of the chest 01/23/2014   BPH (benign prostatic hyperplasia) 05/31/2018   Complication of anesthesia    hard to wake up    Diabetes mellitus without complication (HCC)    Elevated PSA    Erectile dysfunction    Gastroesophageal reflux    H/O sarcoidosis    Headache    7-8 months from gabapentin  and naproxen   Hypertension    Pneumonia of upper lobe of lung 12/1213   Sleep apnea     Past Surgical History:  Procedure Laterality Date   ANTERIOR CERVICAL DECOMP/DISCECTOMY FUSION N/A 05/28/2018   CATARACT EXTRACTION W/PHACO Left 04/11/2019   Procedure: CATARACT EXTRACTION PHACO AND INTRAOCULAR LENS PLACEMENT (IOC) (CDE:5.80);  Surgeon: Harrie Agent, MD;  Location: [facility];  Service: Ophthalmology;  Laterality: Left;   CATARACT EXTRACTION W/PHACO Right 06/03/2019    Procedure: CATARACT EXTRACTION PHACO AND INTRAOCULAR LENS PLACEMENT RIGHT EYE;  Surgeon: Harrie Agent, MD;  Location: [facility];  Service: Ophthalmology;  Laterality: Right;  CDE: 9.82   COLONOSCOPY     COLONOSCOPY N/A 06/23/2014   Procedure: COLONOSCOPY;  Surgeon: Oneil Budge Md, MD;  [facility];  Service: Gastroenterology;  Laterality: N/A;   ELBOW SURGERY Right    2021   EYE SURGERY Bilateral    lasic   ORTHOPEDIC SURGERY Left    heel surgery   PROSTATE BIOPSY     SHOULDER ARTHROSCOPY Right    repair of tendons and ligaments   TONSILLECTOMY     ULNAR NERVE TRANSPOSITION Right 08/28/2022   Procedure: RIGHT ULNAR NERVE DECOMPRESSION WITH TRANSPOSITION POSSIBLE SUBMUSCULAR TRANSPOSITION;  Surgeon: Murrell Drivers, MD;  Location: [facility];  Service: Orthopedics;  Laterality: Right;  120 MIN   WRIST SURGERY     2021    Current Outpatient Medications  Medication Sig Dispense Refill   alfuzosin  (UROXATRAL ) 10 MG 24 hr tablet Take 1 tablet (10 mg total) by mouth daily with breakfast. 90 tablet 3   amLODipine  (NORVASC ) 10 MG tablet Take 5 mg by mouth daily.     chlorthalidone  (HYGROTON ) 25 MG tablet Take 25 mg by mouth daily.     cyanocobalamin  (VITAMIN B12) 1000 MCG/ML injection Inject 1 mL (1,000 mcg total) into the muscle every 30 (thirty) days. 6 mL 0   finasteride  (PROSCAR ) 5 MG tablet Take 1  tablet (5 mg total) by mouth daily. 90 tablet 3   FREESTYLE LITE test strip SMARTSIG:Via Meter 100 each 3   glipiZIDE  (GLUCOTROL ) 5 MG tablet Take 5 mg by mouth every morning.     Glucosamine-Chondroitin (COSAMIN DS PO) Take 1 tablet by mouth daily.     irbesartan  (AVAPRO ) 150 MG tablet Take 1 tablet (150 mg total) by mouth daily. 90 tablet 3   Lancets (FREESTYLE) lancets 1 each by Other route 2 (two) times daily. 100 each 3   Multiple Vitamin (MULTIVITAMIN WITH MINERALS) TABS tablet Take 1 tablet by mouth daily.     NEEDLE, DISP, 25 G (BD DISP NEEDLES) 25G X 5/8 MISC  Patient to use with B 12 injection 12 each 0   omeprazole  (PRILOSEC) 40 MG capsule Take 1 capsule (40 mg total) by mouth every other day. 90 capsule 3   oxyCODONE -acetaminophen  (PERCOCET) 5-325 MG tablet 1-2 tabs po q6 hours prn pain 20 tablet 0   pravastatin  (PRAVACHOL ) 10 MG tablet Take 10 mg by mouth daily.     tadalafil  (CIALIS ) 5 MG tablet Take 1 tablet (5 mg total) by mouth daily. 10 tablet 3   No current facility-administered medications for this visit.   Allergies:  Diclofenac, Diclofenac sodium, Levaquin [levofloxacin in d5w], and Lidocaine    Social History: The patient  reports that he has quit smoking. His smoking use included cigars. He has never used smokeless tobacco. He reports that he does not drink alcohol  and does not use drugs.   Family History: The patient's family history includes Breast cancer in his sister; Diabetes in his brother and sister; Heart disease in his father and sister; Hyperlipidemia in his brother and sister; Lymphoma in his mother.   ROS:  Please see the history of present illness. Otherwise, complete review of systems is positive for none.  All other systems are reviewed and negative.   Physical Exam: VS:  BP 120/72 (BP Location: Left Arm, Patient Position: Sitting, Cuff Size: Large)   Pulse 68   Ht 5' 9.5 (1.765 m)   Wt 248 lb (112.5 kg)   SpO2 97%   BMI 36.10 kg/m , BMI Body mass index is 36.1 kg/m.  Wt Readings from Last 3 Encounters:  02/13/23 248 lb (112.5 kg)  12/22/22 248 lb 12.8 oz (112.9 kg)  11/14/22 248 lb 6.4 oz (112.7 kg)    General: Patient appears comfortable at rest. HEENT: Conjunctiva and lids normal, oropharynx clear with moist mucosa. Neck: Supple, no elevated JVP or carotid bruits, no thyromegaly. Lungs: Clear to auscultation, nonlabored breathing at rest. Cardiac: Regular rate and rhythm, no S3 or significant systolic murmur, no pericardial rub. Abdomen: Soft, nontender, no hepatomegaly, bowel sounds present, no guarding  or rebound. Extremities: No pitting edema, distal pulses 2+. Skin: Warm and dry. Musculoskeletal: No kyphosis. Neuropsychiatric: Alert and oriented x3, affect grossly appropriate.  ECG: Normal sinus rhythm with no ST-T changes  Recent Labwork: 08/06/2022: TSH 2.120 11/07/2022: ALT 18; AST 25; BUN 27; Creatinine, Ser 2.33; Hemoglobin 11.3; Platelets 213; Potassium 3.8; Sodium 136     Component Value Date/Time   CHOL 153 08/06/2022 0841   TRIG 106 08/06/2022 0841   HDL 40 08/06/2022 0841   CHOLHDL 3.8 08/06/2022 0841   LDLCALC 93 08/06/2022 0841     Assessment and Plan: Patient is a 73 year old M known to have HTN, DM 2, HLD, remote history of neurosarcoidosis followed by Dr. Darlean, OSA on CPAP, presented to cardiology clinic for SOB.  SOB/DOE:  TTE in 05/2021 showed LVEF 60 to 65%, G1 DD, no significant valve disease. NM SPECT 04/2021 was low risk with normal EF and likely inferior artifact.  SOB/DOE occurs mainly with overexertion not with regular/ordinary exertional activities.  Encouraged physical exercise for 30 minutes a day for 5 days a week.  Encouraged weight loss.  No further cardiac workup is indicated at this time.  HTN, controlled: Off HCTZ and ARB.  PCP started him on amlodipine  10 mg once daily with adequate control of blood pressures.  Continue ARB statin 150 mg once daily.  Follow-up with PCP.  HLD, at goal: Continue atorvastatin 10 mg nightly, goal LDL less than 100.  Follows with PCP.  OSA on CPAP: Continue CPAP.  Remote history of pulmonary sarcoidosis: Follows up with Dr. Darlean.   Medication Adjustments/Labs and Tests Ordered: Current medicines are reviewed at length with the patient today.  Concerns regarding medicines are outlined above.   Tests Ordered: Orders Placed This Encounter  Procedures   EKG 12-Lead    Medication Changes: No orders of the defined types were placed in this encounter.   Disposition:  Follow up PRN  Signed, Hermione Havlicek Arleta Maywood, MD, 02/13/2023 12:00 PM     [facility] [address], [place], [place] [address]

## 2023-02-26 ENCOUNTER — Other Ambulatory Visit: Payer: Self-pay | Admitting: Internal Medicine

## 2023-02-26 MED ORDER — TADALAFIL 5 MG PO TABS: 5.0000 mg | ORAL_TABLET | Freq: Every day | ORAL | 3 refills | Status: DC

## 2023-02-26 MED ORDER — CHLORTHALIDONE 25 MG PO TABS: 25.0000 mg | ORAL_TABLET | Freq: Every day | ORAL | 0 refills | Status: DC

## 2023-02-26 NOTE — Telephone Encounter (Signed)
Last Fill: Chlorthalidone 05/18/22     Tadalafil 02/03/23  Last OV: 12/22/22 Next OV: 03/2023  Routing to provider for review/authorization.

## 2023-02-26 NOTE — Telephone Encounter (Signed)
Copied from CRM [id number]. Topic: Clinical - Medication Refill >> Feb 26, 2023  3:42 PM Fuller Mandril wrote: Most Recent Primary Care Visit:  Provider: Christel Mormon E  Department: [facility]  Visit Type: OFFICE VISIT  Date: 12/22/2022  Medication:  chlorthalidone (HYGROTON) 25 MG tablet tadalafil (CIALIS) 5 MG tablet   Has the patient contacted their pharmacy? Yes (Agent: If no, request that the patient contact the pharmacy for the refill. If patient does not wish to contact the pharmacy document the reason why and proceed with request.) (Agent: If yes, when and what did the pharmacy advise?) not originally written by provider would need to contact provider  Is this the correct pharmacy for this prescription? Yes If no, delete pharmacy and type the correct one.  This is the patient's preferred pharmacy:  [facility] [address] [place] - [address] [place] [address] [place] [address] Phone: [phone] Fax: [phone]  Has the prescription been filled recently? No  Is the patient out of the medication? Yes - out for a week   Has the patient been seen for an appointment in the last year OR does the patient have an upcoming appointment? Yes  Can we respond through MyChart? Yes  Agent: Please be advised that Rx refills may take up to 3 business days. We ask that you follow-up with your pharmacy.

## 2023-02-27 ENCOUNTER — Telehealth: Payer: Self-pay

## 2023-02-27 ENCOUNTER — Other Ambulatory Visit: Payer: Self-pay

## 2023-02-27 MED ORDER — TADALAFIL 5 MG PO TABS: 5.0000 mg | ORAL_TABLET | Freq: Every day | ORAL | 0 refills | Status: DC

## 2023-02-27 NOTE — Telephone Encounter (Signed)
Copied from CRM [id number]. Topic: Clinical - Prescription Issue >> Feb 27, 2023 11:09 AM Monisha R wrote: Reason for CRM: Patient is concerned about paying $20.00 10 days supply when normally pays $20 and gets 30days supply. Wondering if going to have to pay $20 each time the other 2 refills are needed or will that cover for all. Please contact patient as soon as possible.

## 2023-02-27 NOTE — Telephone Encounter (Signed)
Spoke to patient

## 2023-03-25 ENCOUNTER — Encounter: Payer: Self-pay | Admitting: Internal Medicine

## 2023-03-25 ENCOUNTER — Telehealth (INDEPENDENT_AMBULATORY_CARE_PROVIDER_SITE_OTHER): Payer: PPO | Admitting: Internal Medicine

## 2023-03-25 VITALS — Ht 69.5 in

## 2023-03-25 DIAGNOSIS — Z7984 Long term (current) use of oral hypoglycemic drugs: Secondary | ICD-10-CM

## 2023-03-25 DIAGNOSIS — E1122 Type 2 diabetes mellitus with diabetic chronic kidney disease: Secondary | ICD-10-CM

## 2023-03-25 DIAGNOSIS — N184 Chronic kidney disease, stage 4 (severe): Secondary | ICD-10-CM

## 2023-03-25 DIAGNOSIS — K219 Gastro-esophageal reflux disease without esophagitis: Secondary | ICD-10-CM | POA: Diagnosis not present

## 2023-03-25 DIAGNOSIS — E782 Mixed hyperlipidemia: Secondary | ICD-10-CM | POA: Diagnosis not present

## 2023-03-25 DIAGNOSIS — I10 Essential (primary) hypertension: Secondary | ICD-10-CM | POA: Diagnosis not present

## 2023-03-25 MED ORDER — GLIPIZIDE 5 MG PO TABS
5.0000 mg | ORAL_TABLET | Freq: Every morning | ORAL | 1 refills | Status: DC
Start: 2023-03-25 — End: 2023-09-28

## 2023-03-25 MED ORDER — CHLORTHALIDONE 25 MG PO TABS
25.0000 mg | ORAL_TABLET | Freq: Every day | ORAL | 3 refills | Status: DC
Start: 2023-03-25 — End: 2023-11-23

## 2023-03-25 MED ORDER — AMLODIPINE BESYLATE 10 MG PO TABS
5.0000 mg | ORAL_TABLET | Freq: Every day | ORAL | 3 refills | Status: DC
Start: 2023-03-25 — End: 2023-10-29

## 2023-03-25 NOTE — Progress Notes (Signed)
Virtual Visit via Video Note  I connected with Thomas Malone on 03/25/23 at  3:00 PM EST by a video enabled telemedicine application and verified that I am speaking with the correct person using two identifiers.  Patient Location: Home Provider Location: Home Office  I discussed the limitations, risks, security, and privacy concerns of performing an evaluation and management service by video and the availability of in person appointments. I also discussed with the patient that there may be a patient responsible charge related to this service. The patient expressed understanding and agreed to proceed.  Subjective: PCP: Thomas Lade, MD  Chief Complaint  Patient presents with   Care Management    3 month f/u, needs refills .    [name] has been seen today for follow-up through video encounter.  He was last evaluated by me in November 2024.  Pravastatin was increased to 20 mg daily at that time for improved treatment of hyperlipidemia in the setting of diabetes mellitus.  No additional changes were made and 87-month follow-up was arranged.  In the interim, he has been evaluated by cardiology.  There have otherwise been no acute interval events. [name] reports feeling well today. He is asymptomatic and has no acute concerns to discuss aside from requesting medication refills.   ROS: Per HPI  Current Outpatient Medications:    alfuzosin (UROXATRAL) 10 MG 24 hr tablet, Take 1 tablet (10 mg total) by mouth daily with breakfast., Disp: 90 tablet, Rfl: 3   cyanocobalamin (VITAMIN B12) 1000 MCG/ML injection, Inject 1 mL (1,000 mcg total) into the muscle every 30 (thirty) days., Disp: 6 mL, Rfl: 0   finasteride (PROSCAR) 5 MG tablet, Take 1 tablet (5 mg total) by mouth daily., Disp: 90 tablet, Rfl: 3   FREESTYLE LITE test strip, SMARTSIG:Via Meter, Disp: 100 each, Rfl: 3   Glucosamine-Chondroitin (COSAMIN DS PO), Take 1 tablet by mouth daily., Disp: , Rfl:    irbesartan (AVAPRO) 150 MG  tablet, Take 1 tablet (150 mg total) by mouth daily., Disp: 90 tablet, Rfl: 3   Lancets (FREESTYLE) lancets, 1 each by Other route 2 (two) times daily., Disp: 100 each, Rfl: 3   Multiple Vitamin (MULTIVITAMIN WITH MINERALS) TABS tablet, Take 1 tablet by mouth daily., Disp: , Rfl:    NEEDLE, DISP, 25 G (BD DISP NEEDLES) 25G X 5/8" MISC, Patient to use with B 12 injection, Disp: 12 each, Rfl: 0   omeprazole (PRILOSEC) 40 MG capsule, Take 1 capsule (40 mg total) by mouth every other day., Disp: 90 capsule, Rfl: 3   oxyCODONE-acetaminophen (PERCOCET) 5-325 MG tablet, 1-2 tabs po q6 hours prn pain, Disp: 20 tablet, Rfl: 0   pravastatin (PRAVACHOL) 10 MG tablet, Take 10 mg by mouth daily., Disp: , Rfl:    tadalafil (CIALIS) 5 MG tablet, Take 1 tablet (5 mg total) by mouth daily., Disp: 30 tablet, Rfl: 0   amLODipine (NORVASC) 10 MG tablet, Take 0.5 tablets (5 mg total) by mouth daily., Disp: 90 tablet, Rfl: 3   chlorthalidone (HYGROTON) 25 MG tablet, Take 1 tablet (25 mg total) by mouth daily., Disp: 90 tablet, Rfl: 3   glipiZIDE (GLUCOTROL) 5 MG tablet, Take 1 tablet (5 mg total) by mouth every morning., Disp: 90 tablet, Rfl: 1  Observations/Objective: Today's Vitals   03/25/23 1429  Height: 5' 9.5" (1.765 m)   Assessment and Plan:  Essential hypertension Assessment & Plan: Remains adequately controlled on current antihypertensive regimen.  He checks his blood  pressure regularly at home.  Home BP reading today was 120/77.  Chlorthalidone and amlodipine refilled.  Type 2 diabetes mellitus with stage 4 chronic kidney disease, without long-term current use of insulin (HCC) Assessment & Plan: A1c 7.6 on labs from July 2024.  He remains on glipizide 5 mg daily.  He checks his blood sugar regularly at home and reports readings consistently 130-140.  Repeat A1c at follow-up in 3 months.  Gastroesophageal reflux disease, unspecified whether esophagitis present Assessment & Plan: Symptoms remain  adequately controlled with omeprazole.  Mixed hyperlipidemia Assessment & Plan: Pravastatin was increased to 20 mg daily at his last appointment.  He has not experienced any adverse side effects with the increase in dose.  Repeat lipid panel at follow-up in 3 months.  Follow Up Instructions: Return in about 3 months (around 06/22/2023).   I discussed the assessment and treatment plan with the patient. The patient was provided an opportunity to ask questions, and all were answered. The patient agreed with the plan and demonstrated an understanding of the instructions.   The patient was advised to call back or seek an in-person evaluation if the symptoms worsen or if the condition fails to improve as anticipated.  The above assessment and management plan was discussed with the patient. The patient verbalized understanding of and has agreed to the management plan.   Thomas Lade, MD

## 2023-03-25 NOTE — Assessment & Plan Note (Signed)
Remains adequately controlled on current antihypertensive regimen.  He checks his blood pressure regularly at home.  Home BP reading today was 120/77.  Chlorthalidone and amlodipine refilled.

## 2023-03-25 NOTE — Assessment & Plan Note (Signed)
Pravastatin was increased to 20 mg daily at his last appointment.  He has not experienced any adverse side effects with the increase in dose.  Repeat lipid panel at follow-up in 3 months.

## 2023-03-25 NOTE — Assessment & Plan Note (Signed)
 Symptoms remain adequately controlled with omeprazole.

## 2023-03-25 NOTE — Assessment & Plan Note (Signed)
A1c 7.6 on labs from July 2024.  He remains on glipizide 5 mg daily.  He checks his blood sugar regularly at home and reports readings consistently 130-140.  Repeat A1c at follow-up in 3 months.

## 2023-03-26 ENCOUNTER — Ambulatory Visit: Payer: PPO | Admitting: Internal Medicine

## 2023-03-30 ENCOUNTER — Telehealth: Payer: Self-pay | Admitting: Internal Medicine

## 2023-03-30 ENCOUNTER — Other Ambulatory Visit: Payer: Self-pay | Admitting: Urology

## 2023-03-30 NOTE — Telephone Encounter (Signed)
   Pre-operative Risk Assessment    Patient Name: Thomas Malone  DOB: 09-13-1950 MRN: [id number]      Request for Surgical Clearance    Procedure:   Insertion of inflatable penile prosthesis  Date of Surgery:  Clearance 07/01/23                                 Surgeon:  Dr. Leta Jungling Group or Practice Name:  [facility] Phone number:  [phone] Fax number:  [phone]   Type of Clearance Requested:   - Medical  - Pharmacy:  Hold Aspirin 5   Type of Anesthesia:  General    Additional requests/questions:    Nilda Riggs   03/30/2023, 3:34 PM

## 2023-04-02 ENCOUNTER — Telehealth: Payer: Self-pay

## 2023-04-02 NOTE — Telephone Encounter (Signed)
   Name: Thomas Malone  DOB: 1950-02-16  MRN: [id number]  Primary Cardiologist: Marjo Bicker, MD   Preoperative team, please contact this patient and set up a phone call appointment on or after 05/01/2023 for further preoperative risk assessment. Please obtain consent and complete medication review. Thank you for your help.  I confirm that guidance regarding antiplatelet and oral anticoagulation therapy has been completed and, if necessary, noted below.  Clearance request includes request to hold aspirin.  I do not see aspirin is on patient's medication list.  I also confirmed the patient resides in the state of [place]. As per [facility] Medical Board telemedicine laws, the patient must reside in the state in which the provider is licensed.   Joylene Grapes, NP 04/02/2023, 3:33 PM [facility]

## 2023-04-02 NOTE — Telephone Encounter (Signed)
 Pt schedule for televisit on 05/01/23 at 9:20 am. Med rec and consent done.    Patient Consent for Virtual Visit        Thomas Malone has provided verbal consent on 04/02/2023 for a virtual visit (video or telephone).   CONSENT FOR VIRTUAL VISIT FOR:  Thomas Malone  By participating in this virtual visit I agree to the following:  I hereby voluntarily request, consent and authorize [facility] and its employed or contracted physicians, physician assistants, nurse practitioners or other licensed health care professionals (the Practitioner), to provide me with telemedicine health care services (the "Services") as deemed necessary by the treating Practitioner. I acknowledge and consent to receive the Services by the Practitioner via telemedicine. I understand that the telemedicine visit will involve communicating with the Practitioner through live audiovisual communication technology and the disclosure of certain medical information by electronic transmission. I acknowledge that I have been given the opportunity to request an in-person assessment or other available alternative prior to the telemedicine visit and am voluntarily participating in the telemedicine visit.  I understand that I have the right to withhold or withdraw my consent to the use of telemedicine in the course of my care at any time, without affecting my right to future care or treatment, and that the Practitioner or I may terminate the telemedicine visit at any time. I understand that I have the right to inspect all information obtained and/or recorded in the course of the telemedicine visit and may receive copies of available information for a reasonable fee.  I understand that some of the potential risks of receiving the Services via telemedicine include:  Delay or interruption in medical evaluation due to technological equipment failure or disruption; Information transmitted may not be sufficient (e.g. poor resolution of  images) to allow for appropriate medical decision making by the Practitioner; and/or  In rare instances, security protocols could fail, causing a breach of personal health information.  Furthermore, I acknowledge that it is my responsibility to provide information about my medical history, conditions and care that is complete and accurate to the best of my ability. I acknowledge that Practitioner's advice, recommendations, and/or decision may be based on factors not within their control, such as incomplete or inaccurate data provided by me or distortions of diagnostic images or specimens that may result from electronic transmissions. I understand that the practice of medicine is not an exact science and that Practitioner makes no warranties or guarantees regarding treatment outcomes. I acknowledge that a copy of this consent can be made available to me via my patient portal [facility] MyChart), or I can request a printed copy by calling the office of [facility].    I understand that my insurance will be billed for this visit.   I have read or had this consent read to me. I understand the contents of this consent, which adequately explains the benefits and risks of the Services being provided via telemedicine.  I have been provided ample opportunity to ask questions regarding this consent and the Services and have had my questions answered to my satisfaction. I give my informed consent for the services to be provided through the use of telemedicine in my medical care

## 2023-04-02 NOTE — Telephone Encounter (Signed)
 Pt schedule for televisit on 05/01/23 at 9:20 am. Med rec and consent done.

## 2023-04-10 ENCOUNTER — Telehealth: Payer: Self-pay | Admitting: Urology

## 2023-04-10 NOTE — Telephone Encounter (Signed)
 Return call to patient. Patient was unavailable, so spoke with wife whom state's patient is having frequent urination and some pain when voiding. Wife is made aware for patient to go to the ED or urgent care due to office being closed. Wife voiced understanding.

## 2023-04-10 NOTE — Telephone Encounter (Signed)
 Patient called he is having increase frequency when urinating and he said that it is getting very painful.   Checked schedule we have no availability until 3/14 , pt declined he already has appt set up. Suggested urgent care for urine culture pt decline that as well .

## 2023-04-17 ENCOUNTER — Other Ambulatory Visit: Payer: PPO

## 2023-04-17 DIAGNOSIS — R972 Elevated prostate specific antigen [PSA]: Secondary | ICD-10-CM

## 2023-04-18 LAB — PSA: Prostate Specific Ag, Serum: 23.7 ng/mL — ABNORMAL HIGH (ref 0.0–4.0)

## 2023-04-24 ENCOUNTER — Ambulatory Visit: Payer: PPO | Admitting: Urology

## 2023-04-24 VITALS — BP 118/73 | HR 79

## 2023-04-24 DIAGNOSIS — R300 Dysuria: Secondary | ICD-10-CM | POA: Diagnosis not present

## 2023-04-24 DIAGNOSIS — N138 Other obstructive and reflux uropathy: Secondary | ICD-10-CM

## 2023-04-24 DIAGNOSIS — R972 Elevated prostate specific antigen [PSA]: Secondary | ICD-10-CM

## 2023-04-24 DIAGNOSIS — R351 Nocturia: Secondary | ICD-10-CM

## 2023-04-24 DIAGNOSIS — N401 Enlarged prostate with lower urinary tract symptoms: Secondary | ICD-10-CM

## 2023-04-24 LAB — URINALYSIS, ROUTINE W REFLEX MICROSCOPIC
Bilirubin, UA: NEGATIVE
Glucose, UA: NEGATIVE
Ketones, UA: NEGATIVE
Leukocytes,UA: NEGATIVE
Nitrite, UA: NEGATIVE
Protein,UA: NEGATIVE
RBC, UA: NEGATIVE
Specific Gravity, UA: 1.015 (ref 1.005–1.030)
Urobilinogen, Ur: 0.2 mg/dL (ref 0.2–1.0)
pH, UA: 6 (ref 5.0–7.5)

## 2023-04-24 LAB — BLADDER SCAN AMB NON-IMAGING: Scan Result: 184

## 2023-04-24 MED ORDER — DOXYCYCLINE HYCLATE 100 MG PO CAPS: 100.0000 mg | ORAL_CAPSULE | Freq: Two times a day (BID) | ORAL | 0 refills | Status: AC

## 2023-04-24 NOTE — Progress Notes (Signed)
 04/24/2023 9:38 AM   Thomas Malone 10-Oct-1950 [id number]  Referring provider: [name], MD [address] [place],  [place] [address]  dysuria   HPI: Thomas Malone is a 73yo here for evaluation of dysuria and increasing PSA. His PSA increased to 23.7 from 7 on finasteride. 2 weeks ago he developed dysuria, urinary frequency and urgency. He denies any hematuria. Urine stream fair.    PMH: Past Medical History:  Diagnosis Date   Abnormal CT of the chest 01/23/2014   BPH (benign prostatic hyperplasia) 05/31/2018   Complication of anesthesia    hard to wake up    Diabetes mellitus without complication (HCC)    Elevated PSA    Erectile dysfunction    Gastroesophageal reflux    H/O sarcoidosis    Headache    7-8 months from gabapentin and naproxen   Hypertension    Pneumonia of upper lobe of lung 12/1213   Sleep apnea     Surgical History: Past Surgical History:  Procedure Laterality Date   ANTERIOR CERVICAL DECOMP/DISCECTOMY FUSION N/A 05/28/2018   CATARACT EXTRACTION W/PHACO Left 04/11/2019   Procedure: CATARACT EXTRACTION PHACO AND INTRAOCULAR LENS PLACEMENT (IOC) (CDE:5.80);  Surgeon: Fabio Pierce, MD;  Location: [facility];  Service: Ophthalmology;  Laterality: Left;   CATARACT EXTRACTION W/PHACO Right 06/03/2019   Procedure: CATARACT EXTRACTION PHACO AND INTRAOCULAR LENS PLACEMENT RIGHT EYE;  Surgeon: Fabio Pierce, MD;  Location: [facility];  Service: Ophthalmology;  Laterality: Right;  CDE: 9.82   COLONOSCOPY     COLONOSCOPY N/A 06/23/2014   Procedure: COLONOSCOPY;  Surgeon: Franky Macho Md, MD;  Location: [facility];  Service: Gastroenterology;  Laterality: N/A;   ELBOW SURGERY Right    2021   EYE SURGERY Bilateral    lasic   ORTHOPEDIC SURGERY Left    heel surgery   PROSTATE BIOPSY     SHOULDER ARTHROSCOPY Right    repair of tendons and ligaments   TONSILLECTOMY     ULNAR NERVE TRANSPOSITION Right 08/28/2022   Procedure: RIGHT ULNAR NERVE  DECOMPRESSION WITH TRANSPOSITION POSSIBLE SUBMUSCULAR TRANSPOSITION;  Surgeon: Betha Loa, MD;  Location: [facility];  Service: Orthopedics;  Laterality: Right;  120 MIN   WRIST SURGERY     2021    Home Medications:  Allergies as of 04/24/2023       Reactions   Diclofenac Anaphylaxis   Diclofenac Sodium Anaphylaxis   Topical gel   Levaquin [levofloxacin In D5w] Anaphylaxis   Lidocaine Anaphylaxis        Medication List        Accurate as of April 24, 2023  9:38 AM. If you have any questions, ask your nurse or doctor.          alfuzosin 10 MG 24 hr tablet Commonly known as: UROXATRAL Take 1 tablet (10 mg total) by mouth daily with breakfast.   amLODipine 10 MG tablet Commonly known as: NORVASC Take 0.5 tablets (5 mg total) by mouth daily.   BD Disp Needles 25G X 5/8" Misc Generic drug: NEEDLE (DISP) 25 G Patient to use with B 12 injection   chlorthalidone 25 MG tablet Commonly known as: HYGROTON Take 1 tablet (25 mg total) by mouth daily.   COSAMIN DS PO Take 1 tablet by mouth daily.   cyanocobalamin 1000 MCG/ML injection Commonly known as: VITAMIN B12 Inject 1 mL (1,000 mcg total) into the muscle every 30 (thirty) days.   finasteride 5 MG tablet Commonly known  as: PROSCAR Take 1 tablet (5 mg total) by mouth daily.   freestyle lancets 1 each by Other route 2 (two) times daily.   FREESTYLE LITE test strip Generic drug: glucose blood SMARTSIG:Via Meter   glipiZIDE 5 MG tablet Commonly known as: GLUCOTROL Take 1 tablet (5 mg total) by mouth every morning.   irbesartan 150 MG tablet Commonly known as: AVAPRO Take 1 tablet (150 mg total) by mouth daily.   multivitamin with minerals Tabs tablet Take 1 tablet by mouth daily.   omeprazole 40 MG capsule Commonly known as: PRILOSEC Take 1 capsule (40 mg total) by mouth every other day.   pravastatin 10 MG tablet Commonly known as: PRAVACHOL Take 10 mg by mouth daily.   tadalafil 5  MG tablet Commonly known as: CIALIS Take 1 tablet (5 mg total) by mouth daily.        Allergies:  Allergies  Allergen Reactions   Diclofenac Anaphylaxis   Diclofenac Sodium Anaphylaxis    Topical gel   Levaquin [Levofloxacin In D5w] Anaphylaxis   Lidocaine Anaphylaxis    Family History: Family History  Problem Relation Age of Onset   Lymphoma Mother    Heart disease Father    Hyperlipidemia Sister    Diabetes Sister    Heart disease Sister    Breast cancer Sister    Hyperlipidemia Brother    Diabetes Brother     Social History:  reports that he has quit smoking. His smoking use included cigars. He has never used smokeless tobacco. He reports that he does not drink alcohol and does not use drugs.  ROS: All other review of systems were reviewed and are negative except what is noted above in HPI  Physical Exam: BP 118/73   Pulse 79   Constitutional:  Alert and oriented, No acute distress. HEENT: [place] AT, moist mucus membranes.  Trachea midline, no masses. Cardiovascular: No clubbing, cyanosis, or edema. Respiratory: Normal respiratory effort, no increased work of breathing. GI: Abdomen is soft, nontender, nondistended, no abdominal masses GU: No CVA tenderness.  Lymph: No cervical or inguinal lymphadenopathy. Skin: No rashes, bruises or suspicious lesions. Neurologic: Grossly intact, no focal deficits, moving all 4 extremities. Psychiatric: Normal mood and affect.  Laboratory Data: Lab Results  Component Value Date   WBC 5.6 11/07/2022   HGB 11.3 (L) 11/07/2022   HCT 32.9 (L) 11/07/2022   MCV 81.6 11/07/2022   PLT 213 11/07/2022    Lab Results  Component Value Date   CREATININE 2.33 (H) 11/07/2022    Lab Results  Component Value Date   PSA 6.7 (H) 06/22/2019    No results found for: "TESTOSTERONE"  Lab Results  Component Value Date   HGBA1C 7.6 (H) 08/06/2022    Urinalysis    Component Value Date/Time   COLORURINE STRAW (A) 11/15/2016 0617    APPEARANCEUR Clear 10/24/2022 0842   LABSPEC 1.009 11/15/2016 0617   PHURINE 7.0 11/15/2016 0617   GLUCOSEU Negative 10/24/2022 0842   HGBUR NEGATIVE 11/15/2016 0617   BILIRUBINUR Negative 10/24/2022 0842   KETONESUR NEGATIVE 11/15/2016 0617   PROTEINUR Negative 10/24/2022 0842   PROTEINUR NEGATIVE 11/15/2016 0617   UROBILINOGEN 0.2 06/22/2019 1104   NITRITE Negative 10/24/2022 0842   NITRITE NEGATIVE 11/15/2016 0617   LEUKOCYTESUR Negative 10/24/2022 0842    Lab Results  Component Value Date   LABMICR Comment 10/24/2022    Pertinent Imaging:  No results found for this or any previous visit.  No results found for this or  any previous visit.  No results found for this or any previous visit.  No results found for this or any previous visit.  Results for orders placed during the hospital encounter of 12/24/20  US RENAL  Narrative CLINICAL DATA:  Chronic kidney disease.  EXAM: RENAL / URINARY TRACT ULTRASOUND COMPLETE  COMPARISON:  None.  FINDINGS: Right Kidney:  Renal measurements: 11.5 x 6.1 x 5.2 cm = volume: 188 mL. There is diffuse increased renal parenchymal echogenicity. No hydronephrosis or shadowing stone. There is a 5 cm upper pole cyst.  Left Kidney:  Renal measurements: 11.9 x 6.6 x 5.4 cm = volume: 222 mL. Normal echogenicity. No hydronephrosis or shadowing stone.  Bladder:  Appears normal for degree of bladder distention.  Other:  None.  IMPRESSION: 1. Echogenic right kidney a 5 cm upper pole cyst. No hydronephrosis or shadowing stone. 2. Unremarkable left kidney and urinary bladder.   Electronically Signed By: Elgie Collard M.D. On: 12/24/2020 23:22  No results found for this or any previous visit.  No results found for this or any previous visit.  No results found for this or any previous visit.   Assessment & Plan:    1. Elevated PSA (Primary) Followup 3 months with a PSA - Urinalysis, Routine w reflex  microscopic  2. Benign prostatic hyperplasia with urinary obstruction Continue uroxatral 10mg  daily  3. Dysuria Urine for culture Doxycycline BID for 28 days  4. Nocturia Continue uroxatral 10mg  daily   No follow-ups on file.  Wilkie Aye, MD  [facility] [place]

## 2023-04-24 NOTE — Progress Notes (Signed)
post void residual=184 

## 2023-04-30 ENCOUNTER — Encounter: Payer: Self-pay | Admitting: Urology

## 2023-04-30 MED ORDER — ALFUZOSIN HCL ER 10 MG PO TB24: 10.0000 mg | ORAL_TABLET | Freq: Every day | ORAL | 3 refills | Status: DC

## 2023-04-30 NOTE — Patient Instructions (Signed)
 Prostatitis  Prostatitis is swelling of the prostate gland, also called the prostate. This gland is about 1.5 inches wide and 1 inch high, and it helps to make a fluid called semen. The prostate is below a man's bladder, in front of the butt (rectum). There are different types of prostatitis. What are the causes? One type of prostatitis is caused by an infection from germs (bacteria). Another type is not caused by germs. It may be caused by: Things having to do with the nervous system. This system includes thebrain, spinal cord, and nerves. An autoimmune response. This happens when the body's disease-fighting system attacks healthy tissue in the body by mistake. Psychological factors. These have to do with how the mind works. The causes of other types of prostatitis are normally not known. What are the signs or symptoms? Symptoms of this condition depend on the type of prostatitis you have. If your condition is caused by germs: You may feel pain or burning when you pee (urinate). You may pee often and all of a sudden. You may have problems starting to pee. You may have trouble emptying your bladder when you pee. You may have fever or chills. You may feel pain in your muscles, joints, low back, or lower belly. If you have other types of prostatitis: You may pee often or all of a sudden. You may have trouble starting to pee. You may have a weak flow when you pee. You may leak pee after using the bathroom. You may have other problems, such as: Abnormal fluid coming from the penis. Pain in the testicles or penis. Pain between the butt and the testicles. Pain when fluid comes out of the penis during sex. How is this treated? Treatment for this condition depends on the type of prostatitis. Treatment may include: Medicines. These may treat pain or swelling, or they may help relax muscles. Exercises to help you move better or get stronger (physical therapy). Heat therapy. Techniques to help  you control some of the ways that your body works. Exercises to help you relax. Antibiotic medicine, if your condition is caused by germs. Warm water baths (sitz baths) to relax muscles. Follow these instructions at home: Medicines Take over-the-counter and prescription medicines only as told by your doctor. If you were prescribed an antibiotic medicine, take it as told by your doctor. Do not stop using the antibiotic even if you start to feel better. Managing pain and swelling  Take sitz baths as told by your doctor. For a sitz bath, sit in warm water that is deep enough to cover your hips and butt. If told, put heat on the painful area. Do this as often as told by your doctor. Use the heat source that your doctor recommends, such as a moist heat pack or a heating pad. Place a towel between your skin and the heat source. Leave the heat on for 20-30 minutes. Take off the heat if your skin turns bright red. This is very important if you are unable to feel pain, heat, or cold. You may have a greater risk of getting burned. General instructions Do exercises as told by your doctor, if your doctor prescribed them. Keep all follow-up visits as told by your doctor. This is important. Where to find more information [employer] of Diabetes and Digestive and Kidney Diseases: LowApproval[employer]se Contact a doctor if: Your symptoms get worse. You have a fever. Get help right away if: You have chills. You feel light-headed. You feel like you may  faint. You cannot pee. You have blood or clumps of blood (blood clots) in your pee. Summary Prostatitis is swelling of the prostate gland. There are different types of prostatitis. Treatment depends on the type that you have. Take over-the-counter and prescription medicines only as told by your doctor. Get help right away of you have chills, feel light-headed, or feel like you may faint. Also get help right away if you cannot pee or you have  blood or clumps of blood in your pee. This information is not intended to replace advice given to you by your health care provider. Make sure you discuss any questions you have with your health care provider. Document Revised: 12/05/2021 Document Reviewed: 12/05/2021 Elsevier Patient Education  2024 [employer].

## 2023-05-01 ENCOUNTER — Ambulatory Visit: Payer: PRIVATE HEALTH INSURANCE | Attending: Internal Medicine

## 2023-05-01 DIAGNOSIS — Z01810 Encounter for preprocedural cardiovascular examination: Secondary | ICD-10-CM

## 2023-05-01 NOTE — Progress Notes (Signed)
 Virtual Visit via Telephone Note   Because of [name] co-morbid illnesses, he is at least at moderate risk for complications without adequate follow up.  This format is felt to be most appropriate for this patient at this time.  Due to technical limitations with video connection (technology), today's appointment will be conducted as an audio only telehealth visit, and Thomas Malone verbally agreed to proceed in this manner.   All issues noted in this document were discussed and addressed.  No physical exam could be performed with this format.  Evaluation Performed:  Preoperative cardiovascular risk assessment _____________   Date:  05/01/2023   Patient ID:  Thomas Malone, DOB [birth year], MRN [id number] Patient Location:  Home Provider location:   Office  Primary Care Provider:  [name], MD Primary Cardiologist:  Thomas Bicker, MD  Chief Complaint / Patient Profile   73 y.o. y/o male with a h/o hypertension, DM2, remote history of pulmonary sarcoidosis (follows Thomas Malone), OSA on CPAP who is pending insertion of inflatable penile prothesis and presents today for telephonic preoperative cardiovascular risk assessment.  History of Present Illness    Thomas Malone is a 73 y.o. male who presents via audio/video conferencing for a telehealth visit today.  Pt was last seen in cardiology clinic on 02/13/23 by Thomas Malone.  At that time Thomas Malone was doing well .  The patient is now pending procedure as outlined above. Since his last visit, he tells me that he has had no issues with his heart.  No shortness of breath or chest pain.  He states that if he does go out and do a whole lot outside of his normal he will get tired.  He enjoys fishing and anything outdoors.  He does surpass the minimal METS requirement on the DASI.  He is taking aspirin 81 mg.  He can hold his aspirin 7 days prior to procedure and resume when medically safe to do so.  Past Medical History     Past Medical History:  Diagnosis Date   Abnormal CT of the chest 01/23/2014   BPH (benign prostatic hyperplasia) 05/31/2018   Complication of anesthesia    hard to wake up    Diabetes mellitus without complication (HCC)    Elevated PSA    Erectile dysfunction    Gastroesophageal reflux    H/O sarcoidosis    Headache    7-8 months from gabapentin and naproxen   Hypertension    Pneumonia of upper lobe of lung 12/1213   Sleep apnea    Past Surgical History:  Procedure Laterality Date   ANTERIOR CERVICAL DECOMP/DISCECTOMY FUSION N/A 05/28/2018   CATARACT EXTRACTION W/PHACO Left 04/11/2019   Procedure: CATARACT EXTRACTION PHACO AND INTRAOCULAR LENS PLACEMENT (IOC) (CDE:5.80);  Surgeon: Fabio Pierce, MD;  Location: [facility];  Service: Ophthalmology;  Laterality: Left;   CATARACT EXTRACTION W/PHACO Right 06/03/2019   Procedure: CATARACT EXTRACTION PHACO AND INTRAOCULAR LENS PLACEMENT RIGHT EYE;  Surgeon: Fabio Pierce, MD;  Location: [facility];  Service: Ophthalmology;  Laterality: Right;  CDE: 9.82   COLONOSCOPY     COLONOSCOPY N/A 06/23/2014   Procedure: COLONOSCOPY;  Surgeon: Franky Macho Md, MD;  Location: [facility];  Service: Gastroenterology;  Laterality: N/A;   ELBOW SURGERY Right    2021   EYE SURGERY Bilateral    lasic   ORTHOPEDIC SURGERY Left    heel surgery   PROSTATE BIOPSY     SHOULDER ARTHROSCOPY Right  repair of tendons and ligaments   TONSILLECTOMY     ULNAR NERVE TRANSPOSITION Right 08/28/2022   Procedure: RIGHT ULNAR NERVE DECOMPRESSION WITH TRANSPOSITION POSSIBLE SUBMUSCULAR TRANSPOSITION;  Surgeon: Betha Loa, MD;  Location: [facility];  Service: Orthopedics;  Laterality: Right;  120 MIN   WRIST SURGERY     2021    Allergies  Allergies  Allergen Reactions   Diclofenac Anaphylaxis   Diclofenac Sodium Anaphylaxis    Topical gel   Levaquin [Levofloxacin In D5w] Anaphylaxis   Lidocaine Anaphylaxis    Home Medications    Prior  to Admission medications   Medication Sig Start Date End Date Taking? Authorizing Provider  alfuzosin (UROXATRAL) 10 MG 24 hr tablet Take 1 tablet (10 mg total) by mouth daily with breakfast. 04/30/23   Malone, Thomas Celeste, MD  amLODipine (NORVASC) 10 MG tablet Take 0.5 tablets (5 mg total) by mouth daily. 03/25/23   Thomas Lade, MD  chlorthalidone (HYGROTON) 25 MG tablet Take 1 tablet (25 mg total) by mouth daily. 03/25/23   Thomas Lade, MD  cyanocobalamin (VITAMIN B12) 1000 MCG/ML injection Inject 1 mL (1,000 mcg total) into the muscle every 30 (thirty) days. 11/14/22   Thomas Kaufmann, NP  doxycycline (VIBRAMYCIN) 100 MG capsule Take 1 capsule (100 mg total) by mouth 2 (two) times daily for 28 days. 04/24/23 05/22/23  Malen Gauze, MD  finasteride (PROSCAR) 5 MG tablet Take 1 tablet (5 mg total) by mouth daily. 10/24/22   Malen Gauze, MD  FREESTYLE LITE test strip SMARTSIG:Via Meter 08/06/22   Thomas Lade, MD  glipiZIDE (GLUCOTROL) 5 MG tablet Take 1 tablet (5 mg total) by mouth every morning. 03/25/23   Thomas Lade, MD  Glucosamine-Chondroitin (COSAMIN DS PO) Take 1 tablet by mouth daily.    [provider]  irbesartan (AVAPRO) 150 MG tablet Take 1 tablet (150 mg total) by mouth daily. 12/22/22   Thomas Lade, MD  Lancets (FREESTYLE) lancets 1 each by Other route 2 (two) times daily. 08/06/22   Thomas Lade, MD  Multiple Vitamin (MULTIVITAMIN WITH MINERALS) TABS tablet Take 1 tablet by mouth daily.    [provider]  NEEDLE, DISP, 25 G (BD DISP NEEDLES) 25G X 5/8" MISC Patient to use with B 12 injection 11/25/22   Pennington, Rebekah M, PA-C  omeprazole (PRILOSEC) 40 MG capsule Take 1 capsule (40 mg total) by mouth every other day. 12/22/22   Thomas Lade, MD  pravastatin (PRAVACHOL) 10 MG tablet Take 10 mg by mouth daily.    [provider]  tadalafil (CIALIS) 5 MG tablet Take 1 tablet (5 mg total) by mouth daily. 02/27/23    Thomas Lade, MD    Physical Exam    Vital Signs:  Thomas Malone does not have vital signs available for review today.  Given telephonic nature of communication, physical exam is limited. AAOx3. NAD. Normal affect.  Speech and respirations are unlabored.  Accessory Clinical Findings    None  Assessment & Plan    1.  Preoperative Cardiovascular Risk Assessment:  Thomas Malone's perioperative risk of a major cardiac event is 0.9% according to the Revised Cardiac Risk Index (RCRI).  Therefore, he is at low risk for perioperative complications.   His functional capacity is good at 5.62 METs according to the [place] Activity Status Index (DASI). Recommendations: According to ACC/AHA guidelines, no further cardiovascular testing needed.  The patient may proceed to surgery at  acceptable risk.   Antiplatelet and/or Anticoagulation Recommendations: Aspirin can be held for 7 days prior to his surgery.  Please resume Aspirin post operatively when it is felt to be safe from a bleeding standpoint.    The patient was advised that if he develops new symptoms prior to surgery to contact our office to arrange for a follow-up visit, and he verbalized understanding.   A copy of this note will be routed to requesting surgeon.  Time:   Today, I have spent 5 minutes with the patient with telehealth technology discussing medical history, symptoms, and management plan.     Sharlene Dory, PA-C  05/01/2023, 9:26 AM

## 2023-05-07 ENCOUNTER — Other Ambulatory Visit: Payer: Self-pay

## 2023-05-07 DIAGNOSIS — D649 Anemia, unspecified: Secondary | ICD-10-CM

## 2023-05-07 DIAGNOSIS — R778 Other specified abnormalities of plasma proteins: Secondary | ICD-10-CM

## 2023-05-08 ENCOUNTER — Inpatient Hospital Stay: Payer: PPO | Attending: Hematology

## 2023-05-08 DIAGNOSIS — G4730 Sleep apnea, unspecified: Secondary | ICD-10-CM | POA: Diagnosis not present

## 2023-05-08 DIAGNOSIS — D631 Anemia in chronic kidney disease: Secondary | ICD-10-CM | POA: Diagnosis not present

## 2023-05-08 DIAGNOSIS — D509 Iron deficiency anemia, unspecified: Secondary | ICD-10-CM | POA: Insufficient documentation

## 2023-05-08 DIAGNOSIS — R351 Nocturia: Secondary | ICD-10-CM | POA: Insufficient documentation

## 2023-05-08 DIAGNOSIS — K219 Gastro-esophageal reflux disease without esophagitis: Secondary | ICD-10-CM | POA: Diagnosis not present

## 2023-05-08 DIAGNOSIS — Z803 Family history of malignant neoplasm of breast: Secondary | ICD-10-CM | POA: Insufficient documentation

## 2023-05-08 DIAGNOSIS — N401 Enlarged prostate with lower urinary tract symptoms: Secondary | ICD-10-CM | POA: Diagnosis not present

## 2023-05-08 DIAGNOSIS — R5383 Other fatigue: Secondary | ICD-10-CM | POA: Insufficient documentation

## 2023-05-08 DIAGNOSIS — D649 Anemia, unspecified: Secondary | ICD-10-CM

## 2023-05-08 DIAGNOSIS — R779 Abnormality of plasma protein, unspecified: Secondary | ICD-10-CM | POA: Diagnosis present

## 2023-05-08 DIAGNOSIS — Z79899 Other long term (current) drug therapy: Secondary | ICD-10-CM | POA: Insufficient documentation

## 2023-05-08 DIAGNOSIS — I129 Hypertensive chronic kidney disease with stage 1 through stage 4 chronic kidney disease, or unspecified chronic kidney disease: Secondary | ICD-10-CM | POA: Insufficient documentation

## 2023-05-08 DIAGNOSIS — E1122 Type 2 diabetes mellitus with diabetic chronic kidney disease: Secondary | ICD-10-CM | POA: Insufficient documentation

## 2023-05-08 DIAGNOSIS — R778 Other specified abnormalities of plasma proteins: Secondary | ICD-10-CM

## 2023-05-08 DIAGNOSIS — N529 Male erectile dysfunction, unspecified: Secondary | ICD-10-CM | POA: Diagnosis not present

## 2023-05-08 DIAGNOSIS — N1832 Chronic kidney disease, stage 3b: Secondary | ICD-10-CM | POA: Insufficient documentation

## 2023-05-08 DIAGNOSIS — Z87891 Personal history of nicotine dependence: Secondary | ICD-10-CM | POA: Insufficient documentation

## 2023-05-08 DIAGNOSIS — Z7984 Long term (current) use of oral hypoglycemic drugs: Secondary | ICD-10-CM | POA: Diagnosis not present

## 2023-05-08 LAB — COMPREHENSIVE METABOLIC PANEL WITH GFR
ALT: 18 U/L (ref 0–44)
AST: 25 U/L (ref 15–41)
Albumin: 3.8 g/dL (ref 3.5–5.0)
Alkaline Phosphatase: 71 U/L (ref 38–126)
Anion gap: 9 (ref 5–15)
BUN: 29 mg/dL — ABNORMAL HIGH (ref 8–23)
CO2: 24 mmol/L (ref 22–32)
Calcium: 9.8 mg/dL (ref 8.9–10.3)
Chloride: 106 mmol/L (ref 98–111)
Creatinine, Ser: 2.33 mg/dL — ABNORMAL HIGH (ref 0.61–1.24)
GFR, Estimated: 29 mL/min — ABNORMAL LOW (ref >60)
Glucose, Bld: 139 mg/dL — ABNORMAL HIGH (ref 70–99)
Potassium: 4 mmol/L (ref 3.5–5.1)
Sodium: 139 mmol/L (ref 135–145)
Total Bilirubin: 1.1 mg/dL (ref 0.0–1.2)
Total Protein: 7.4 g/dL (ref 6.5–8.1)

## 2023-05-08 LAB — FERRITIN: Ferritin: 91 ng/mL (ref 24–336)

## 2023-05-08 LAB — CBC WITH DIFFERENTIAL/PLATELET
Abs Immature Granulocytes: 0.01 10*3/uL (ref 0.00–0.07)
Basophils Absolute: 0 10*3/uL (ref 0.0–0.1)
Basophils Relative: 1 %
Eosinophils Absolute: 0.3 10*3/uL (ref 0.0–0.5)
Eosinophils Relative: 6 %
HCT: 35.3 % — ABNORMAL LOW (ref 39.0–52.0)
Hemoglobin: 11.9 g/dL — ABNORMAL LOW (ref 13.0–17.0)
Immature Granulocytes: 0 %
Lymphocytes Relative: 31 %
Lymphs Abs: 1.9 10*3/uL (ref 0.7–4.0)
MCH: 28 pg (ref 26.0–34.0)
MCHC: 33.7 g/dL (ref 30.0–36.0)
MCV: 83.1 fL (ref 80.0–100.0)
Monocytes Absolute: 0.6 10*3/uL (ref 0.1–1.0)
Monocytes Relative: 9 %
Neutro Abs: 3.3 10*3/uL (ref 1.7–7.7)
Neutrophils Relative %: 53 %
Platelets: 212 10*3/uL (ref 150–400)
RBC: 4.25 MIL/uL (ref 4.22–5.81)
RDW: 12.3 % (ref 11.5–15.5)
WBC: 6.1 10*3/uL (ref 4.0–10.5)
nRBC: 0 % (ref 0.0–0.2)

## 2023-05-08 LAB — IRON AND TIBC
Iron: 82 ug/dL (ref 45–182)
Saturation Ratios: 28 % (ref 17.9–39.5)
TIBC: 291 ug/dL (ref 250–450)
UIBC: 209 ug/dL

## 2023-05-08 LAB — VITAMIN B12: Vitamin B-12: 1100 pg/mL — ABNORMAL HIGH (ref 180–914)

## 2023-05-11 LAB — PROTEIN ELECTROPHORESIS, SERUM
A/G Ratio: 1.1 (ref 0.7–1.7)
Albumin ELP: 3.7 g/dL (ref 2.9–4.4)
Alpha-1-Globulin: 0.2 g/dL (ref 0.0–0.4)
Alpha-2-Globulin: 0.6 g/dL (ref 0.4–1.0)
Beta Globulin: 1 g/dL (ref 0.7–1.3)
Gamma Globulin: 1.4 g/dL (ref 0.4–1.8)
Globulin, Total: 3.3 g/dL (ref 2.2–3.9)
Total Protein ELP: 7 g/dL (ref 6.0–8.5)

## 2023-05-11 LAB — KAPPA/LAMBDA LIGHT CHAINS
Kappa free light chain: 46 mg/L — ABNORMAL HIGH (ref 3.3–19.4)
Kappa, lambda light chain ratio: 0.99 (ref 0.26–1.65)
Lambda free light chains: 46.7 mg/L — ABNORMAL HIGH (ref 5.7–26.3)

## 2023-05-12 LAB — IMMUNOFIXATION ELECTROPHORESIS
IgA: 303 mg/dL (ref 61–437)
IgG (Immunoglobin G), Serum: 1556 mg/dL (ref 603–1613)
IgM (Immunoglobulin M), Srm: 83 mg/dL (ref 15–143)
Total Protein ELP: 6.9 g/dL (ref 6.0–8.5)

## 2023-05-12 LAB — METHYLMALONIC ACID, SERUM: Methylmalonic Acid, Quantitative: 213 nmol/L (ref 0–378)

## 2023-05-15 ENCOUNTER — Inpatient Hospital Stay (HOSPITAL_BASED_OUTPATIENT_CLINIC_OR_DEPARTMENT_OTHER): Payer: PPO | Admitting: Oncology

## 2023-05-15 VITALS — BP 128/76 | HR 68 | Temp 98.4°F | Resp 16 | Wt 241.0 lb

## 2023-05-15 DIAGNOSIS — R778 Other specified abnormalities of plasma proteins: Secondary | ICD-10-CM

## 2023-05-15 DIAGNOSIS — R779 Abnormality of plasma protein, unspecified: Secondary | ICD-10-CM | POA: Diagnosis not present

## 2023-05-15 DIAGNOSIS — D649 Anemia, unspecified: Secondary | ICD-10-CM | POA: Diagnosis not present

## 2023-05-15 MED ORDER — CYANOCOBALAMIN 1000 MCG/ML IJ SOLN: 1000.0000 ug | INTRAMUSCULAR | 0 refills | Status: DC

## 2023-05-15 NOTE — Progress Notes (Signed)
 [facility] [address], [place] [address]   CLINIC:  Medical Oncology/Hematology  PCP:  Tobi Fortes, MD [address] [place] [address] [phone]   REASON FOR VISIT:  Follow-up for abnormal SPEP  CURRENT THERAPY: Surveillance  INTERVAL HISTORY:   Thomas Malone 73 y.o. male returns for routine follow-up of abnormal SPEP.  He was last seen by me on 11/14/2022.  He denies any hospitalizations, surgeries or changes to his baseline health since his last visit.  He is followed by urology for elevated PSA, BPH, nocturia and erectile dysfunction.  Patient was referred to Dr. Darrelyn Ely for penile implant which she will have placed at the end of May.    Reports overall he is doing well.  Appetite is at 100% energy levels are 50%.  Reports feeling easily fatigued especially with ambulation.  States he can fall asleep quickly if he sits or is watching television.  Reports this is a new problem.  Denies any bleeding, bright red blood per rectum, melena or hematochezia.  He just met with Dr. Carrolyn Clan and all of his labs looked good.  He continues to drink only water  and does not add any additional salt to his food.  Reports he has been giving himself monthly B12 injections.  He received 1 in our clinic back in October but has been giving them to himself at home since.  Reports they do boost his energy for a week or so.  He is interested in having this refilled if possible.   ASSESSMENT & PLAN:  1.  Abnormal SPEP - Referred by Dr. Carrolyn Clan due to labs from 12/20/2020 and 04/12/2021 showing a poorly defined band of restricted protein and gammaglobulin regions on SPEP/IFE.  Urine IFE showed poorly defined area of restricted protein mobility detected and is reactive with kappa light chain antisera. - Hematology work-up (10/21/2021): SPEP and immunofixation negative for monoclonal protein. Urine immunofixation unremarkable, no evidence of monoclonal  protein. Elevated kappa light chains 42.1 with lambda light chains elevated at 35.4 and normal ratio 1.19.  This is in keeping with patient's CKD stage IIIb. Mildly elevated beta-2  microglobulin 2.6.  Normal LDH. No evidence of CRAB features on CBC/CMP.  (Baseline anemia with Hgb 11.7, baseline CKD stage IIIb with creatinine 1.88, calcium 8.9) - MGUS/myeloma panel (05/09/2022): Immunofixation negative. SPEP normal. Stable elevation in kappa light chain 58.4, elevated lambda light chain 46.3, normal kappa/lambda ratio 1.26.  This is in keeping with patient CKD stage IIIb. LDH normal. Creatinine 2.41, somewhat worsened from baseline.  Normal calcium 9.1.  Mild anemia with Hgb 11.7/MCV 82.9. 24-hour urine shows elevated light chains, but immunofixation pattern is unremarkable and M spike is negative.  Plan: Abnormal SPEP: -Most recent labs for 05/08/23 show a calcium of 9.8, creatinine 2.33 hemoglobin 11.9 with a normal differential.   -SPEP did not reveal an M spike and protein electrophoresis was WNL.  Lambda free light chains are elevated but ratio is normal. - No new bone pain, B symptoms, or neurologic changes. - Patient needs 24-hour urine. -If he develops an M spike either in urine or blood would recommend skeletal survey and bone marrow biopsy if indicated. -RTC in 6 months with labs a few days before and office visit.  2.  Anemia: - Likely multifactorial with relative iron deficiency and CKD. -Patient is symptomatic with fatigue.  Hemoglobin is 11.9 (11.3).  - Nutritional workup was essentially unremarkable from 11/14/22. - He is taking iron  supplements 1/day.  He is interested in continuing B12 injections. -Labs from 05/08/2023 show a ferritin of 91 (79), iron saturation 28% (27%). Hemoglobin has improved some and is 11.9. - Continue iron supplements. -Discussed continuing B12 injections.  New prescription sent to pharmacy.  B12 level is 1100 which is slightly elevated.  B12 injections  appear to give him a boost of his energy and we will continue for now.  If levels continue to rise, will switch to every other month injections.  PLAN SUMMARY: >> Discussed giving B12 injections at home.  New prescription sent. >> UPEP ASAP.  >> Return to clinic in 6 months with labs a few days before and follow-up.    REVIEW OF SYSTEMS:   Review of Systems  Constitutional:  Positive for fatigue.  Respiratory:  Positive for shortness of breath.   Genitourinary:  Positive for difficulty urinating.   Neurological:  Positive for dizziness.     PHYSICAL EXAM:  ECOG PERFORMANCE STATUS: 1 - Symptomatic but completely ambulatory  There were no vitals filed for this visit. Filed Weights   05/15/23 1027  Weight: 240 lb 15.4 oz (109.3 kg)   Physical Exam Constitutional:      Appearance: Normal appearance.  Cardiovascular:     Rate and Rhythm: Normal rate and regular rhythm.  Pulmonary:     Effort: Pulmonary effort is normal.     Breath sounds: Normal breath sounds.  Abdominal:     General: Bowel sounds are normal.     Palpations: Abdomen is soft.  Musculoskeletal:        General: No swelling. Normal range of motion.  Neurological:     Mental Status: He is alert and oriented to person, place, and time. Mental status is at baseline.     PAST MEDICAL/SURGICAL HISTORY:  Past Medical History:  Diagnosis Date   Abnormal CT of the chest 01/23/2014   BPH (benign prostatic hyperplasia) 05/31/2018   Complication of anesthesia    hard to wake up    Diabetes mellitus without complication (HCC)    Elevated PSA    Erectile dysfunction    Gastroesophageal reflux    H/O sarcoidosis    Headache    7-8 months from gabapentin  and naproxen   Hypertension    Pneumonia of upper lobe of lung 12/1213   Sleep apnea    Past Surgical History:  Procedure Laterality Date   ANTERIOR CERVICAL DECOMP/DISCECTOMY FUSION N/A 05/28/2018   CATARACT EXTRACTION W/PHACO Left 04/11/2019   Procedure:  CATARACT EXTRACTION PHACO AND INTRAOCULAR LENS PLACEMENT (IOC) (CDE:5.80);  Surgeon: Tarri Farm, MD;  Location: [facility];  Service: Ophthalmology;  Laterality: Left;   CATARACT EXTRACTION W/PHACO Right 06/03/2019   Procedure: CATARACT EXTRACTION PHACO AND INTRAOCULAR LENS PLACEMENT RIGHT EYE;  Surgeon: Tarri Farm, MD;  Location: [facility];  Service: Ophthalmology;  Laterality: Right;  CDE: 9.82   COLONOSCOPY     COLONOSCOPY N/A 06/23/2014   Procedure: COLONOSCOPY;  Surgeon: Alanda Allegra Md, MD;  Location: [facility];  Service: Gastroenterology;  Laterality: N/A;   ELBOW SURGERY Right    2021   EYE SURGERY Bilateral    lasic   ORTHOPEDIC SURGERY Left    heel surgery   PROSTATE BIOPSY     SHOULDER ARTHROSCOPY Right    repair of tendons and ligaments   TONSILLECTOMY     ULNAR NERVE TRANSPOSITION Right 08/28/2022   Procedure: RIGHT ULNAR NERVE DECOMPRESSION WITH TRANSPOSITION POSSIBLE SUBMUSCULAR TRANSPOSITION;  Surgeon: Brunilda Capra, MD;  Location:  [facility];  Service: Orthopedics;  Laterality: Right;  120 MIN   WRIST SURGERY     2021    SOCIAL HISTORY:  Social History   Socioeconomic History   Marital status: Married    Spouse name: Not on file   Number of children: 2   Years of education: Not on file   Highest education level: Not on file  Occupational History   Occupation: concrete finisher  Tobacco Use   Smoking status: Former    Types: Cigars   Smokeless tobacco: Never   Tobacco comments:    and pipe-none since 2010  Vaping Use   Vaping status: Never Used  Substance and Sexual Activity   Alcohol  use: No   Drug use: No   Sexual activity: Yes    Birth control/protection: None  Other Topics Concern   Not on file  Social History Narrative   Not on file   Social Drivers of Health   Financial Resource Strain: Low Risk  (10/07/2022)   Overall Financial Resource Strain (CARDIA)    Difficulty of Paying Living Expenses: Not hard at all  Food  Insecurity: No Food Insecurity (10/07/2022)   Hunger Vital Sign    Worried About Running Out of Food in the Last Year: Never true    Ran Out of Food in the Last Year: Never true  Transportation Needs: No Transportation Needs (10/07/2022)   PRAPARE - Administrator, Civil Service (Medical): No    Lack of Transportation (Non-Medical): No  Physical Activity: Sufficiently Active (10/07/2022)   Exercise Vital Sign    Days of Exercise per Week: 7 days    Minutes of Exercise per Session: 30 min  Stress: No Stress Concern Present (10/07/2022)   [employer] of Occupational Health - Occupational Stress Questionnaire    Feeling of Stress : Not at all  Social Connections: Moderately Integrated (10/07/2022)   Social Connection and Isolation Panel [NHANES]    Frequency of Communication with Friends and Family: More than three times a week    Frequency of Social Gatherings with Friends and Family: More than three times a week    Attends Religious Services: More than 4 times per year    Active Member of [employer] or Organizations: No    Attends Banker Meetings: Never    Marital Status: Married  Catering manager Violence: Not At Risk (10/07/2022)   Humiliation, Afraid, Rape, and Kick questionnaire    Fear of Current or Ex-Partner: No    Emotionally Abused: No    Physically Abused: No    Sexually Abused: No    FAMILY HISTORY:  Family History  Problem Relation Age of Onset   Lymphoma Mother    Heart disease Father    Hyperlipidemia Sister    Diabetes Sister    Heart disease Sister    Breast cancer Sister    Hyperlipidemia Brother    Diabetes Brother     CURRENT MEDICATIONS:  Outpatient Encounter Medications as of 05/15/2023  Medication Sig   alfuzosin  (UROXATRAL ) 10 MG 24 hr tablet Take 1 tablet (10 mg total) by mouth daily with breakfast.   amLODipine  (NORVASC ) 10 MG tablet Take 0.5 tablets (5 mg total) by mouth daily.   chlorthalidone  (HYGROTON ) 25 MG tablet Take 1  tablet (25 mg total) by mouth daily.   cyanocobalamin  (VITAMIN B12) 1000 MCG/ML injection Inject 1 mL (1,000 mcg total) into the muscle every 30 (thirty) days.   doxycycline  (VIBRAMYCIN ) 100 MG  capsule Take 1 capsule (100 mg total) by mouth 2 (two) times daily for 28 days.   finasteride  (PROSCAR ) 5 MG tablet Take 1 tablet (5 mg total) by mouth daily.   FREESTYLE LITE test strip SMARTSIG:Via Meter   glipiZIDE  (GLUCOTROL ) 5 MG tablet Take 1 tablet (5 mg total) by mouth every morning.   Glucosamine-Chondroitin (COSAMIN DS PO) Take 1 tablet by mouth daily.   irbesartan  (AVAPRO ) 150 MG tablet Take 1 tablet (150 mg total) by mouth daily.   Lancets (FREESTYLE) lancets 1 each by Other route 2 (two) times daily.   Multiple Vitamin (MULTIVITAMIN WITH MINERALS) TABS tablet Take 1 tablet by mouth daily.   NEEDLE, DISP, 25 G (BD DISP NEEDLES) 25G X 5/8" MISC Patient to use with B 12 injection   omeprazole  (PRILOSEC) 40 MG capsule Take 1 capsule (40 mg total) by mouth every other day.   pravastatin  (PRAVACHOL ) 10 MG tablet Take 10 mg by mouth daily.   tadalafil  (CIALIS ) 5 MG tablet Take 1 tablet (5 mg total) by mouth daily.   No facility-administered encounter medications on file as of 05/15/2023.    ALLERGIES:  Allergies  Allergen Reactions   Diclofenac Anaphylaxis   Diclofenac Sodium Anaphylaxis    Topical gel   Levaquin [Levofloxacin In D5w] Anaphylaxis   Lidocaine  Anaphylaxis    LABORATORY DATA:  I have reviewed the labs as listed.  CBC    Component Value Date/Time   WBC 6.1 05/08/2023 1022   RBC 4.25 05/08/2023 1022   HGB 11.9 (L) 05/08/2023 1022   HGB 11.7 (L) 08/06/2022 0841   HCT 35.3 (L) 05/08/2023 1022   HCT 35.8 (L) 08/06/2022 0841   PLT 212 05/08/2023 1022   PLT 214 08/06/2022 0841   MCV 83.1 05/08/2023 1022   MCV 83 08/06/2022 0841   MCH 28.0 05/08/2023 1022   MCHC 33.7 05/08/2023 1022   RDW 12.3 05/08/2023 1022   RDW 12.1 08/06/2022 0841   LYMPHSABS 1.9 05/08/2023 1022    LYMPHSABS 1.7 08/06/2022 0841   MONOABS 0.6 05/08/2023 1022   EOSABS 0.3 05/08/2023 1022   EOSABS 0.3 08/06/2022 0841   BASOSABS 0.0 05/08/2023 1022   BASOSABS 0.0 08/06/2022 0841      Latest Ref Rng & Units 05/08/2023   10:22 AM 11/07/2022   10:55 AM 08/06/2022    8:41 AM  CMP  Glucose 70 - 99 mg/dL 409  811  914   BUN 8 - 23 mg/dL 29  27  22    Creatinine 0.61 - 1.24 mg/dL 7.82  9.56  2.13   Sodium 135 - 145 mmol/L 139  136  141   Potassium 3.5 - 5.1 mmol/L 4.0  3.8  3.9   Chloride 98 - 111 mmol/L 106  102  105   CO2 22 - 32 mmol/L 24  25  23    Calcium 8.9 - 10.3 mg/dL 9.8  8.8  9.5   Total Protein 6.5 - 8.1 g/dL 7.4  6.9  6.9   Total Bilirubin 0.0 - 1.2 mg/dL 1.1  0.8  0.6   Alkaline Phos 38 - 126 U/L 71  81  95   AST 15 - 41 U/L 25  25  18    ALT 0 - 44 U/L 18  18  10      DIAGNOSTIC IMAGING:  I have independently reviewed the relevant imaging and discussed with the patient.   WRAP UP:  All questions were answered. The patient knows to call the clinic with  any problems, questions or concerns.  Medical decision making: mod  Time spent on visit: I spent 25 minutes dedicated to the care of this patient (face-to-face and non-face-to-face) on the date of the encounter to include what is described in the assessment and plan.  Thomas Blue, NP  05/15/23 10:28 AM

## 2023-05-19 ENCOUNTER — Other Ambulatory Visit: Payer: Self-pay

## 2023-05-19 DIAGNOSIS — D649 Anemia, unspecified: Secondary | ICD-10-CM

## 2023-05-19 DIAGNOSIS — R779 Abnormality of plasma protein, unspecified: Secondary | ICD-10-CM | POA: Diagnosis not present

## 2023-05-19 DIAGNOSIS — R778 Other specified abnormalities of plasma proteins: Secondary | ICD-10-CM

## 2023-05-25 ENCOUNTER — Encounter: Payer: Self-pay | Admitting: Hematology

## 2023-05-25 LAB — UPEP/UIFE/LIGHT CHAINS/TP, 24-HR UR
% BETA, Urine: 32.1 %
ALPHA 1 URINE: 1.8 %
Albumin, U: 15.2 %
Alpha 2, Urine: 16.4 %
Free Kappa Lt Chains,Ur: 121.11 mg/L — ABNORMAL HIGH (ref 1.17–86.46)
Free Kappa/Lambda Ratio: 4.42 (ref 1.83–14.26)
Free Lambda Lt Chains,Ur: 27.38 mg/L — ABNORMAL HIGH (ref 0.27–15.21)
GAMMA GLOBULIN URINE: 34.5 %
Total Protein, Urine-Ur/day: 191 mg/(24.h) — ABNORMAL HIGH (ref 30–150)
Total Protein, Urine: 7.2 mg/dL
Total Volume: 2650

## 2023-06-24 ENCOUNTER — Encounter (HOSPITAL_BASED_OUTPATIENT_CLINIC_OR_DEPARTMENT_OTHER): Payer: Self-pay | Admitting: Urology

## 2023-06-24 ENCOUNTER — Other Ambulatory Visit: Payer: Self-pay

## 2023-06-25 ENCOUNTER — Encounter: Payer: Self-pay | Admitting: Internal Medicine

## 2023-06-25 ENCOUNTER — Ambulatory Visit: Payer: PRIVATE HEALTH INSURANCE | Admitting: Internal Medicine

## 2023-06-25 VITALS — BP 109/67 | HR 74 | Ht 69.0 in | Wt 237.8 lb

## 2023-06-25 DIAGNOSIS — K219 Gastro-esophageal reflux disease without esophagitis: Secondary | ICD-10-CM

## 2023-06-25 DIAGNOSIS — E1122 Type 2 diabetes mellitus with diabetic chronic kidney disease: Secondary | ICD-10-CM | POA: Diagnosis not present

## 2023-06-25 DIAGNOSIS — N184 Chronic kidney disease, stage 4 (severe): Secondary | ICD-10-CM | POA: Diagnosis not present

## 2023-06-25 DIAGNOSIS — E782 Mixed hyperlipidemia: Secondary | ICD-10-CM

## 2023-06-25 DIAGNOSIS — I10 Essential (primary) hypertension: Secondary | ICD-10-CM | POA: Diagnosis not present

## 2023-06-25 NOTE — Progress Notes (Signed)
 Established Patient Office Visit  Subjective   Patient ID: Thomas Malone, male    DOB: 12/21/1950  Age: 73 y.o. MRN: [id number]  Chief Complaint  Patient presents with   Hypertension    Three month follow up    Dizziness    Off and on dizziness    Thomas Malone returns to care today for routine follow-up.  He was last evaluated by me on 2/19 through video encounter.  No medication changes were made at that time and 70-month follow-up was arranged.  In the interim he has seen urology and oncology for follow-up.  There have otherwise been no acute interval events.  Today he reports feeling well and has no acute concerns to discuss.  Past Medical History:  Diagnosis Date   Abnormal CT of the chest 01/23/2014   BPH (benign prostatic hyperplasia) 05/31/2018   Complication of anesthesia    hard to wake up    Diabetes mellitus without complication (HCC)    Elevated PSA    Erectile dysfunction    Gastroesophageal reflux    H/O sarcoidosis    Headache    7-8 months from gabapentin  and naproxen   Hypertension    Pneumonia of upper lobe of lung 12/1213   Sleep apnea    Past Surgical History:  Procedure Laterality Date   ANTERIOR CERVICAL DECOMP/DISCECTOMY FUSION N/A 05/28/2018   CATARACT EXTRACTION W/PHACO Left 04/11/2019   Procedure: CATARACT EXTRACTION PHACO AND INTRAOCULAR LENS PLACEMENT (IOC) (CDE:5.80);  Surgeon: Tarri Farm, MD;  Location: [facility];  Service: Ophthalmology;  Laterality: Left;   CATARACT EXTRACTION W/PHACO Right 06/03/2019   Procedure: CATARACT EXTRACTION PHACO AND INTRAOCULAR LENS PLACEMENT RIGHT EYE;  Surgeon: Tarri Farm, MD;  Location: [facility];  Service: Ophthalmology;  Laterality: Right;  CDE: 9.82   COLONOSCOPY     COLONOSCOPY N/A 06/23/2014   Procedure: COLONOSCOPY;  Surgeon: Alanda Allegra Md, MD;  Location: [facility];  Service: Gastroenterology;  Laterality: N/A;   ELBOW SURGERY Right    2021   EYE SURGERY Bilateral    lasic   ORTHOPEDIC SURGERY  Left    heel surgery   PENILE PROSTHESIS IMPLANT N/A 07/01/2023   Procedure: INSERTION OF INFLATABLE PENILE PROSTHESIS;  Surgeon: Mallie Seal, MD;  Location: [facility];  Service: Urology;  Laterality: N/A;  120 MINUTES NEEDED FOR CASE   PROSTATE BIOPSY     SHOULDER ARTHROSCOPY Right    repair of tendons and ligaments   TONSILLECTOMY     ULNAR NERVE TRANSPOSITION Right 08/28/2022   Procedure: RIGHT ULNAR NERVE DECOMPRESSION WITH TRANSPOSITION POSSIBLE SUBMUSCULAR TRANSPOSITION;  Surgeon: Brunilda Capra, MD;  Location: [facility];  Service: Orthopedics;  Laterality: Right;  120 MIN   WRIST SURGERY     2021   Social History   Tobacco Use   Smoking status: Former    Types: Cigars   Smokeless tobacco: Never   Tobacco comments:    and pipe-none since 2010  Vaping Use   Vaping status: Never Used  Substance Use Topics   Alcohol  use: No   Drug use: No   Family History  Problem Relation Age of Onset   Lymphoma Mother    Heart disease Father    Hyperlipidemia Sister    Diabetes Sister    Heart disease Sister    Breast cancer Sister    Hyperlipidemia Brother    Diabetes Brother    Allergies  Allergen Reactions   Diclofenac Anaphylaxis  Diclofenac Sodium Anaphylaxis    Topical gel   Levaquin [Levofloxacin In D5w] Anaphylaxis   Lidocaine  Anaphylaxis   Review of Systems  Constitutional:  Negative for chills and fever.  HENT:  Negative for sore throat.   Respiratory:  Negative for cough and shortness of breath.   Cardiovascular:  Negative for chest pain, palpitations and leg swelling.  Gastrointestinal:  Negative for abdominal pain, blood in stool, constipation, diarrhea, nausea and vomiting.  Genitourinary:  Negative for dysuria and hematuria.  Musculoskeletal:  Negative for myalgias.  Skin:  Negative for itching and rash.  Neurological:  Negative for dizziness and headaches.  Psychiatric/Behavioral:  Negative for depression and suicidal  ideas.      Objective:     BP 109/67   Pulse 74   Ht 5' 9 (1.753 m)   Wt 237 lb 12.8 oz (107.9 kg)   SpO2 96%   BMI 35.12 kg/m  BP Readings from Last 3 Encounters:  07/01/23 118/75  06/25/23 109/67  05/15/23 128/76   Physical Exam Vitals reviewed.  Constitutional:      General: He is not in acute distress.    Appearance: Normal appearance. He is obese. He is not ill-appearing.  HENT:     Head: Normocephalic and atraumatic.     Right Ear: External ear normal.     Left Ear: External ear normal.     Nose: Nose normal. No congestion or rhinorrhea.     Mouth/Throat:     Mouth: Mucous membranes are moist.     Pharynx: Oropharynx is clear.   Eyes:     General: No scleral icterus.    Extraocular Movements: Extraocular movements intact.     Conjunctiva/sclera: Conjunctivae normal.     Pupils: Pupils are equal, round, and reactive to light.    Cardiovascular:     Rate and Rhythm: Normal rate and regular rhythm.     Pulses: Normal pulses.     Heart sounds: Normal heart sounds. No murmur heard. Pulmonary:     Effort: Pulmonary effort is normal.     Breath sounds: Normal breath sounds. No wheezing, rhonchi or rales.  Abdominal:     General: Abdomen is flat. Bowel sounds are normal. There is no distension.     Palpations: Abdomen is soft.     Tenderness: There is no abdominal tenderness.   Musculoskeletal:        General: No swelling or deformity. Normal range of motion.     Cervical back: Normal range of motion.   Skin:    General: Skin is warm and dry.     Capillary Refill: Capillary refill takes less than 2 seconds.   Neurological:     General: No focal deficit present.     Mental Status: He is alert and oriented to person, place, and time.     Motor: No weakness.   Psychiatric:        Mood and Affect: Mood normal.        Behavior: Behavior normal.        Thought Content: Thought content normal.   Last CBC Lab Results  Component Value Date   WBC 6.1  05/08/2023   HGB 11.9 (L) 05/08/2023   HCT 35.3 (L) 05/08/2023   MCV 83.1 05/08/2023   MCH 28.0 05/08/2023   RDW 12.3 05/08/2023   PLT 212 05/08/2023   Last metabolic panel Lab Results  Component Value Date   GLUCOSE 170 (H) 06/30/2023   NA 140 06/30/2023  K 3.8 06/30/2023   CL 106 06/30/2023   CO2 26 06/30/2023   BUN 22 06/30/2023   CREATININE 2.10 (H) 06/30/2023   GFRNONAA 33 (L) 06/30/2023   CALCIUM 9.4 06/30/2023   PROT 7.4 05/08/2023   ALBUMIN 3.8 05/08/2023   LABGLOB 3.3 05/08/2023   AGRATIO 1.1 05/08/2023   BILITOT 1.1 05/08/2023   ALKPHOS 71 05/08/2023   AST 25 05/08/2023   ALT 18 05/08/2023   ANIONGAP 8 06/30/2023   Last lipids Lab Results  Component Value Date   CHOL 132 06/25/2023   HDL 32 (L) 06/25/2023   LDLCALC 78 06/25/2023   TRIG 122 06/25/2023   CHOLHDL 4.1 06/25/2023   Last hemoglobin A1c Lab Results  Component Value Date   HGBA1C 7.9 (H) 06/25/2023   Last thyroid  functions Lab Results  Component Value Date   TSH 2.120 08/06/2022   Last vitamin D  Lab Results  Component Value Date   VD25OH 44.09 11/14/2022   Last vitamin B12 and Folate Lab Results  Component Value Date   VITAMINB12 1,100 (H) 05/08/2023   FOLATE 16.0 11/14/2022   The 10-year ASCVD risk score (Arnett DK, et al., 2019) is: 31.1%    Assessment & Plan:   Problem List Items Addressed This Visit       Essential hypertension (Chronic)   Remains adequately controlled on current antihypertensive regimen.      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  He is currently prescribed glipizide  5 mg daily.  Repeat A1c ordered today.      Chronic kidney disease, stage 4 (severe) (HCC)   Seen by nephrology for follow-up last month.  Labs are stable.  He remains on irbesartan .      HLD (hyperlipidemia)  (Chronic)   Lipid panel last updated in July 2024.  Total cholesterol 153 and LDL 93.  Pravastatin  was previously increased to 20 mg daily in light of this result.  Repeat lipid panel ordered today.      Return in about 3 months (around 09/25/2023).   Tobi Fortes, MD

## 2023-06-25 NOTE — Patient Instructions (Signed)
 It was a pleasure to see you today.  Thank you for giving [place] the opportunity to be involved in your care.  Below is a brief recap of your visit and next steps.  We will plan to see you again in 3 months.  Summary No medication changes today Repeat labs ordered Follow up in 3 months

## 2023-06-26 ENCOUNTER — Ambulatory Visit: Payer: Self-pay | Admitting: Internal Medicine

## 2023-06-26 LAB — LIPID PANEL
Chol/HDL Ratio: 4.1 ratio (ref 0.0–5.0)
Cholesterol, Total: 132 mg/dL (ref 100–199)
HDL: 32 mg/dL — ABNORMAL LOW (ref >39)
LDL Chol Calc (NIH): 78 mg/dL (ref 0–99)
Triglycerides: 122 mg/dL (ref 0–149)
VLDL Cholesterol Cal: 22 mg/dL (ref 5–40)

## 2023-06-26 LAB — HEMOGLOBIN A1C
Est. average glucose Bld gHb Est-mCnc: 180 mg/dL
Hgb A1c MFr Bld: 7.9 % — ABNORMAL HIGH (ref 4.8–5.6)

## 2023-06-30 ENCOUNTER — Encounter (HOSPITAL_BASED_OUTPATIENT_CLINIC_OR_DEPARTMENT_OTHER)
Admission: RE | Admit: 2023-06-30 | Discharge: 2023-06-30 | Disposition: A | Discharge status: Home / Self Care | Payer: PRIVATE HEALTH INSURANCE | Source: Ambulatory Visit | Attending: Urology | Admitting: Urology

## 2023-06-30 DIAGNOSIS — Z6834 Body mass index (BMI) 34.0-34.9, adult: Secondary | ICD-10-CM | POA: Diagnosis not present

## 2023-06-30 DIAGNOSIS — N289 Disorder of kidney and ureter, unspecified: Secondary | ICD-10-CM | POA: Diagnosis not present

## 2023-06-30 DIAGNOSIS — R0602 Shortness of breath: Secondary | ICD-10-CM | POA: Diagnosis not present

## 2023-06-30 DIAGNOSIS — E119 Type 2 diabetes mellitus without complications: Secondary | ICD-10-CM | POA: Diagnosis not present

## 2023-06-30 DIAGNOSIS — Z79899 Other long term (current) drug therapy: Secondary | ICD-10-CM | POA: Diagnosis not present

## 2023-06-30 DIAGNOSIS — G4730 Sleep apnea, unspecified: Secondary | ICD-10-CM | POA: Diagnosis not present

## 2023-06-30 DIAGNOSIS — Z7982 Long term (current) use of aspirin: Secondary | ICD-10-CM | POA: Diagnosis not present

## 2023-06-30 DIAGNOSIS — K219 Gastro-esophageal reflux disease without esophagitis: Secondary | ICD-10-CM | POA: Diagnosis not present

## 2023-06-30 DIAGNOSIS — N528 Other male erectile dysfunction: Secondary | ICD-10-CM | POA: Diagnosis present

## 2023-06-30 DIAGNOSIS — I10 Essential (primary) hypertension: Secondary | ICD-10-CM | POA: Diagnosis not present

## 2023-06-30 DIAGNOSIS — E669 Obesity, unspecified: Secondary | ICD-10-CM | POA: Diagnosis not present

## 2023-06-30 DIAGNOSIS — Z833 Family history of diabetes mellitus: Secondary | ICD-10-CM | POA: Diagnosis not present

## 2023-06-30 DIAGNOSIS — Z7984 Long term (current) use of oral hypoglycemic drugs: Secondary | ICD-10-CM | POA: Diagnosis not present

## 2023-06-30 DIAGNOSIS — N486 Induration penis plastica: Secondary | ICD-10-CM | POA: Diagnosis not present

## 2023-06-30 DIAGNOSIS — R0609 Other forms of dyspnea: Secondary | ICD-10-CM | POA: Diagnosis not present

## 2023-06-30 DIAGNOSIS — Z87891 Personal history of nicotine dependence: Secondary | ICD-10-CM | POA: Diagnosis not present

## 2023-06-30 DIAGNOSIS — R519 Headache, unspecified: Secondary | ICD-10-CM | POA: Diagnosis not present

## 2023-06-30 LAB — BASIC METABOLIC PANEL WITH GFR
Anion gap: 8 (ref 5–15)
BUN: 22 mg/dL (ref 8–23)
CO2: 26 mmol/L (ref 22–32)
Calcium: 9.4 mg/dL (ref 8.9–10.3)
Chloride: 106 mmol/L (ref 98–111)
Creatinine, Ser: 2.1 mg/dL — ABNORMAL HIGH (ref 0.61–1.24)
GFR, Estimated: 33 mL/min — ABNORMAL LOW (ref >60)
Glucose, Bld: 170 mg/dL — ABNORMAL HIGH (ref 70–99)
Potassium: 3.8 mmol/L (ref 3.5–5.1)
Sodium: 140 mmol/L (ref 135–145)

## 2023-06-30 MED ORDER — FLUCONAZOLE IN SODIUM CHLORIDE 200-0.9 MG/100ML-% IV SOLN
200.0000 mg | INTRAVENOUS | Status: DC
  Administered 2023-07-01: 200 mg via INTRAVENOUS
  Filled 2023-06-30: qty 100

## 2023-06-30 MED ORDER — VANCOMYCIN HCL 1500 MG/300ML IV SOLN
1500.0000 mg | INTRAVENOUS | Status: AC
  Administered 2023-07-01: 1500 mg via INTRAVENOUS
  Filled 2023-06-30: qty 300

## 2023-06-30 MED ORDER — CHLORHEXIDINE GLUCONATE 4 % EX SOLN: Freq: Once | CUTANEOUS | Status: DC

## 2023-06-30 MED ORDER — GENTAMICIN SULFATE 40 MG/ML IJ SOLN
5.0000 mg/kg | INTRAVENOUS | Status: AC
  Administered 2023-07-01: 562.4 mg via INTRAVENOUS
  Filled 2023-06-30: qty 14

## 2023-06-30 NOTE — Anesthesia Preprocedure Evaluation (Signed)
 Anesthesia Evaluation  Patient identified by MRN, date of birth, ID band Patient awake    Reviewed: Allergy & Precautions, NPO status , Patient's Chart, lab work & pertinent test results  History of Anesthesia Complications (+) history of anesthetic complications  Airway Mallampati: III  TM Distance: >3 FB Neck ROM: Full    Dental  (+) Dental Advisory Given, Partial Upper, Partial Lower   Pulmonary sleep apnea , former smoker Sarcoidosis 20 years ago. Resolved per patient, but with residual SOB.   Pulmonary exam normal breath sounds clear to auscultation       Cardiovascular hypertension, Pt. on medications + DOE  Normal cardiovascular exam Rhythm:Regular Rate:Normal     Neuro/Psych  Headaches S/p ACDF    GI/Hepatic Neg liver ROS,GERD  Medicated,,  Endo/Other  diabetes, Type 2, Oral Hypoglycemic Agents  Obesity   Renal/GU Renal InsufficiencyRenal disease   ERECTILE DYSFUNCTION    Musculoskeletal negative musculoskeletal ROS (+)    Abdominal   Peds  Hematology negative hematology ROS (+)   Anesthesia Other Findings   Reproductive/Obstetrics                             Anesthesia Physical Anesthesia Plan  ASA: 3  Anesthesia Plan: General   Post-op Pain Management: Tylenol  PO (pre-op)* and Toradol IV (intra-op)*   Induction: Intravenous  PONV Risk Score and Plan: 2 and Dexamethasone  and Ondansetron   Airway Management Planned: Oral ETT  Additional Equipment:   Intra-op Plan:   Post-operative Plan: Extubation in OR  Informed Consent: I have reviewed the patients History and Physical, chart, labs and discussed the procedure including the risks, benefits and alternatives for the proposed anesthesia with the patient or authorized representative who has indicated his/her understanding and acceptance.     Dental advisory given  Plan Discussed with: CRNA  Anesthesia Plan  Comments:        Anesthesia Quick Evaluation

## 2023-06-30 NOTE — Progress Notes (Signed)
Surgical soap given with instructions, pt verbalized understanding.  

## 2023-07-01 ENCOUNTER — Ambulatory Visit (HOSPITAL_BASED_OUTPATIENT_CLINIC_OR_DEPARTMENT_OTHER): Payer: Self-pay | Admitting: Anesthesiology

## 2023-07-01 ENCOUNTER — Encounter (HOSPITAL_BASED_OUTPATIENT_CLINIC_OR_DEPARTMENT_OTHER)
Admission: RE | Disposition: A | Discharge status: Home / Self Care | Payer: Self-pay | Source: Home / Self Care | Attending: Urology

## 2023-07-01 ENCOUNTER — Encounter (HOSPITAL_BASED_OUTPATIENT_CLINIC_OR_DEPARTMENT_OTHER): Payer: Self-pay | Admitting: Urology

## 2023-07-01 ENCOUNTER — Ambulatory Visit (HOSPITAL_BASED_OUTPATIENT_CLINIC_OR_DEPARTMENT_OTHER)
Admission: RE | Admit: 2023-07-01 | Discharge: 2023-07-01 | Disposition: A | Discharge status: Home / Self Care | Payer: PRIVATE HEALTH INSURANCE | Attending: Urology | Admitting: Urology

## 2023-07-01 ENCOUNTER — Other Ambulatory Visit: Payer: Self-pay

## 2023-07-01 DIAGNOSIS — N289 Disorder of kidney and ureter, unspecified: Secondary | ICD-10-CM | POA: Insufficient documentation

## 2023-07-01 DIAGNOSIS — I10 Essential (primary) hypertension: Secondary | ICD-10-CM | POA: Insufficient documentation

## 2023-07-01 DIAGNOSIS — Z833 Family history of diabetes mellitus: Secondary | ICD-10-CM | POA: Insufficient documentation

## 2023-07-01 DIAGNOSIS — I129 Hypertensive chronic kidney disease with stage 1 through stage 4 chronic kidney disease, or unspecified chronic kidney disease: Secondary | ICD-10-CM

## 2023-07-01 DIAGNOSIS — R0602 Shortness of breath: Secondary | ICD-10-CM | POA: Insufficient documentation

## 2023-07-01 DIAGNOSIS — N1831 Chronic kidney disease, stage 3a: Secondary | ICD-10-CM | POA: Diagnosis not present

## 2023-07-01 DIAGNOSIS — Z79899 Other long term (current) drug therapy: Secondary | ICD-10-CM | POA: Insufficient documentation

## 2023-07-01 DIAGNOSIS — K219 Gastro-esophageal reflux disease without esophagitis: Secondary | ICD-10-CM | POA: Insufficient documentation

## 2023-07-01 DIAGNOSIS — N529 Male erectile dysfunction, unspecified: Secondary | ICD-10-CM | POA: Diagnosis not present

## 2023-07-01 DIAGNOSIS — Z87891 Personal history of nicotine dependence: Secondary | ICD-10-CM | POA: Insufficient documentation

## 2023-07-01 DIAGNOSIS — R0609 Other forms of dyspnea: Secondary | ICD-10-CM | POA: Insufficient documentation

## 2023-07-01 DIAGNOSIS — E119 Type 2 diabetes mellitus without complications: Secondary | ICD-10-CM | POA: Insufficient documentation

## 2023-07-01 DIAGNOSIS — N486 Induration penis plastica: Secondary | ICD-10-CM | POA: Insufficient documentation

## 2023-07-01 DIAGNOSIS — E1122 Type 2 diabetes mellitus with diabetic chronic kidney disease: Secondary | ICD-10-CM

## 2023-07-01 DIAGNOSIS — Z7982 Long term (current) use of aspirin: Secondary | ICD-10-CM | POA: Insufficient documentation

## 2023-07-01 DIAGNOSIS — Z6834 Body mass index (BMI) 34.0-34.9, adult: Secondary | ICD-10-CM | POA: Insufficient documentation

## 2023-07-01 DIAGNOSIS — Z7984 Long term (current) use of oral hypoglycemic drugs: Secondary | ICD-10-CM | POA: Insufficient documentation

## 2023-07-01 DIAGNOSIS — G4730 Sleep apnea, unspecified: Secondary | ICD-10-CM | POA: Insufficient documentation

## 2023-07-01 DIAGNOSIS — N528 Other male erectile dysfunction: Secondary | ICD-10-CM | POA: Insufficient documentation

## 2023-07-01 DIAGNOSIS — E669 Obesity, unspecified: Secondary | ICD-10-CM | POA: Insufficient documentation

## 2023-07-01 DIAGNOSIS — R519 Headache, unspecified: Secondary | ICD-10-CM | POA: Insufficient documentation

## 2023-07-01 HISTORY — PX: PENILE PROSTHESIS IMPLANT: SHX240

## 2023-07-01 LAB — GLUCOSE, CAPILLARY
Glucose-Capillary: 140 mg/dL — ABNORMAL HIGH (ref 70–99)
Glucose-Capillary: 151 mg/dL — ABNORMAL HIGH (ref 70–99)

## 2023-07-01 SURGERY — INSERTION, PENILE PROSTHESIS
Anesthesia: General | Site: Penis

## 2023-07-01 MED ORDER — ACETAMINOPHEN 500 MG PO TABS
ORAL_TABLET | ORAL | Status: AC
Start: 2023-07-01 — End: ?
  Filled 2023-07-01: qty 2

## 2023-07-01 MED ORDER — OXYCODONE HCL 5 MG PO TABS
5.0000 mg | ORAL_TABLET | Freq: Once | ORAL | Status: AC
  Administered 2023-07-01: 5 mg via ORAL

## 2023-07-01 MED ORDER — LACTATED RINGERS IV SOLN: INTRAVENOUS | Status: DC

## 2023-07-01 MED ORDER — IRRISEPT - 450ML BOTTLE WITH 0.05% CHG IN STERILE WATER, USP 99.95% OPTIME
TOPICAL | Status: DC | PRN
Start: 2023-07-01 — End: 2023-07-01
  Administered 2023-07-01: 450 mL
  Administered 2023-07-01: 900 mL

## 2023-07-01 MED ORDER — PROPOFOL 500 MG/50ML IV EMUL
INTRAVENOUS | Status: AC
  Filled 2023-07-01: qty 50

## 2023-07-01 MED ORDER — ROCURONIUM BROMIDE 100 MG/10ML IV SOLN
INTRAVENOUS | Status: DC | PRN
  Administered 2023-07-01: 60 mg via INTRAVENOUS

## 2023-07-01 MED ORDER — FENTANYL CITRATE (PF) 100 MCG/2ML IJ SOLN
25.0000 ug | INTRAMUSCULAR | Status: DC | PRN
  Administered 2023-07-01 (×5): 25 ug via INTRAVENOUS

## 2023-07-01 MED ORDER — OXYCODONE HCL 5 MG PO TABS
ORAL_TABLET | ORAL | Status: AC
  Filled 2023-07-01: qty 1

## 2023-07-01 MED ORDER — BUPIVACAINE HCL (PF) 0.25 % IJ SOLN
INTRAMUSCULAR | Status: AC
  Filled 2023-07-01: qty 60

## 2023-07-01 MED ORDER — FENTANYL CITRATE (PF) 100 MCG/2ML IJ SOLN
INTRAMUSCULAR | Status: AC
  Filled 2023-07-01: qty 2

## 2023-07-01 MED ORDER — MUPIROCIN 2 % EX OINT
1.0000 | TOPICAL_OINTMENT | Freq: Once | CUTANEOUS | Status: AC
  Administered 2023-07-01: 1 via NASAL

## 2023-07-01 MED ORDER — EPHEDRINE SULFATE (PRESSORS) 50 MG/ML IJ SOLN
INTRAMUSCULAR | Status: DC | PRN
  Administered 2023-07-01 (×2): 10 mg via INTRAVENOUS

## 2023-07-01 MED ORDER — SUGAMMADEX SODIUM 200 MG/2ML IV SOLN
INTRAVENOUS | Status: DC | PRN
Start: 2023-07-01 — End: 2023-07-01
  Administered 2023-07-01: 200 mg via INTRAVENOUS

## 2023-07-01 MED ORDER — PHENYLEPHRINE HCL (PRESSORS) 10 MG/ML IV SOLN
INTRAVENOUS | Status: DC | PRN
  Administered 2023-07-01: 40 ug via INTRAVENOUS

## 2023-07-01 MED ORDER — FENTANYL CITRATE (PF) 100 MCG/2ML IJ SOLN
INTRAMUSCULAR | Status: AC
Start: 2023-07-01 — End: ?
  Filled 2023-07-01: qty 2

## 2023-07-01 MED ORDER — ONDANSETRON HCL 4 MG/2ML IJ SOLN
INTRAMUSCULAR | Status: AC
  Filled 2023-07-01: qty 2

## 2023-07-01 MED ORDER — FENTANYL CITRATE (PF) 100 MCG/2ML IJ SOLN
INTRAMUSCULAR | Status: DC | PRN
Start: 2023-07-01 — End: 2023-07-01
  Administered 2023-07-01 (×2): 50 ug via INTRAVENOUS
  Administered 2023-07-01: 100 ug via INTRAVENOUS

## 2023-07-01 MED ORDER — LIDOCAINE 2% (20 MG/ML) 5 ML SYRINGE
INTRAMUSCULAR | Status: AC
  Filled 2023-07-01: qty 5

## 2023-07-01 MED ORDER — ACETAMINOPHEN 500 MG PO TABS
1000.0000 mg | ORAL_TABLET | Freq: Once | ORAL | Status: AC
  Administered 2023-07-01: 1000 mg via ORAL

## 2023-07-01 MED ORDER — OXYCODONE HCL 5 MG PO TABS: 5.0000 mg | ORAL_TABLET | Freq: Four times a day (QID) | ORAL | 0 refills | Status: DC | PRN

## 2023-07-01 MED ORDER — PROPOFOL 10 MG/ML IV BOLUS
INTRAVENOUS | Status: DC | PRN
  Administered 2023-07-01: 140 mg via INTRAVENOUS

## 2023-07-01 MED ORDER — LIDOCAINE HCL (PF) 1 % IJ SOLN
INTRAMUSCULAR | Status: AC
Start: 2023-07-01 — End: ?
  Filled 2023-07-01: qty 30

## 2023-07-01 MED ORDER — BUPIVACAINE HCL (PF) 0.5 % IJ SOLN
INTRAMUSCULAR | Status: AC
Start: 2023-07-01 — End: ?
  Filled 2023-07-01: qty 30

## 2023-07-01 MED ORDER — MUPIROCIN 2 % EX OINT
TOPICAL_OINTMENT | CUTANEOUS | Status: AC
  Filled 2023-07-01: qty 22

## 2023-07-01 MED ORDER — DEXAMETHASONE SODIUM PHOSPHATE 4 MG/ML IJ SOLN
INTRAMUSCULAR | Status: DC | PRN
  Administered 2023-07-01: 4 mg via INTRAVENOUS

## 2023-07-01 MED ORDER — DEXAMETHASONE SODIUM PHOSPHATE 10 MG/ML IJ SOLN
INTRAMUSCULAR | Status: AC
  Filled 2023-07-01: qty 1

## 2023-07-01 MED ORDER — ACETAMINOPHEN 500 MG PO TABS: 1000.0000 mg | ORAL_TABLET | Freq: Three times a day (TID) | ORAL | 0 refills | Status: DC

## 2023-07-01 MED ORDER — ONDANSETRON HCL 4 MG/2ML IJ SOLN: 4.0000 mg | Freq: Once | INTRAMUSCULAR | Status: DC | PRN

## 2023-07-01 MED ORDER — SODIUM CHLORIDE 0.9 % IR SOLN
Status: DC | PRN
  Administered 2023-07-01: 1000 mL

## 2023-07-01 SURGICAL SUPPLY — 49 items
BLADE SURG 15 STRL LF DISP TIS (BLADE) ×2 IMPLANT
BNDG GAUZE DERMACEA FLUFF 4 (GAUZE/BANDAGES/DRESSINGS) ×2 IMPLANT
BRIEF MESH DISP 2XL (UNDERPADS AND DIAPERS) ×2 IMPLANT
BRUSH SCRUB EZ PLAIN DRY (MISCELLANEOUS) IMPLANT
CATH COUDE 5CC RIBBED (CATHETERS) ×2 IMPLANT
CHLORAPREP W/TINT 26 (MISCELLANEOUS) ×4 IMPLANT
COVER BACK TABLE 60X90IN (DRAPES) ×2 IMPLANT
COVER MAYO STAND STRL (DRAPES) ×4 IMPLANT
DERMABOND ADVANCED .7 DNX12 (GAUZE/BANDAGES/DRESSINGS) ×2 IMPLANT
DRAIN CHANNEL 10F 3/8 F FF (DRAIN) ×2 IMPLANT
DRAPE INCISE IOBAN 66X45 STRL (DRAPES) ×2 IMPLANT
DRAPE LAPAROTOMY 100X72 PEDS (DRAPES) ×2 IMPLANT
DRAPE UTILITY XL STRL (DRAPES) ×2 IMPLANT
DRSG TEGADERM 4X4.75 (GAUZE/BANDAGES/DRESSINGS) ×2 IMPLANT
EVACUATOR SILICONE 100CC (DRAIN) ×2 IMPLANT
GAUZE SPONGE 4X4 12PLY STRL (GAUZE/BANDAGES/DRESSINGS) ×2 IMPLANT
GLOVE BIO SURGEON STRL SZ 6 (GLOVE) IMPLANT
GLOVE BIO SURGEON STRL SZ7 (GLOVE) ×2 IMPLANT
GLOVE BIOGEL PI IND STRL 7.0 (GLOVE) ×2 IMPLANT
GLOVE ECLIPSE 6.5 STRL STRAW (GLOVE) IMPLANT
GOWN STRL REUS W/TWL LRG LVL3 (GOWN DISPOSABLE) ×2 IMPLANT
HIBICLENS CHG 4% 4OZ BTL (MISCELLANEOUS) IMPLANT
IMPL RTE SNAPCONE CX 1.0 (Urological Implant) IMPLANT
IV NS 1000ML BAXH (IV SOLUTION) IMPLANT
KIT ACCESSORY AMS 700 PUMP (Urological Implant) IMPLANT
LAVAGE JET IRRISEPT WOUND (IRRIGATION / IRRIGATOR) ×4 IMPLANT
NDL HYPO 22X1.5 SAFETY MO (MISCELLANEOUS) ×2 IMPLANT
NEEDLE HYPO 22X1.5 SAFETY MO (MISCELLANEOUS) ×1 IMPLANT
PACK BASIN DAY SURGERY FS (CUSTOM PROCEDURE TRAY) ×2 IMPLANT
PENCIL SMOKE EVACUATOR (MISCELLANEOUS) ×2 IMPLANT
PLUG CATH AND CAP STRL 200 (CATHETERS) ×2 IMPLANT
PUMP PENILE AMS 700CX MS 12X21 (Erectile Restoration) IMPLANT
RESERVOIR FLAT IZ 100ML (Miscellaneous) IMPLANT
RETRACTOR DEEP SCROTAL PENILE (MISCELLANEOUS) IMPLANT
SET COLLECT BLD 21X.75 12 PB G (NEEDLE) ×2 IMPLANT
SLEEVE SCD COMPRESS KNEE MED (STOCKING) ×2 IMPLANT
SUT ETHILON 3 0 PS 1 (SUTURE) ×2 IMPLANT
SUT MNCRL AB 4-0 PS2 18 (SUTURE) ×2 IMPLANT
SUT VIC AB 2-0 UR6 27 (SUTURE) ×8 IMPLANT
SUT VIC AB 3-0 SH 27X BRD (SUTURE) ×4 IMPLANT
SYR 10ML LL (SYRINGE) ×6 IMPLANT
SYR 50ML LL SCALE MARK (SYRINGE) ×6 IMPLANT
SYR BULB IRRIG 60ML STRL (SYRINGE) ×2 IMPLANT
SYR CONTROL 10ML LL (SYRINGE) ×2 IMPLANT
TOWEL GREEN STERILE FF (TOWEL DISPOSABLE) ×4 IMPLANT
TRAY DSU PREP LF (CUSTOM PROCEDURE TRAY) ×2 IMPLANT
TUBE CONNECTING 20X1/4 (TUBING) ×2 IMPLANT
WATER STERILE IRR 1000ML POUR (IV SOLUTION) ×2 IMPLANT
YANKAUER SUCT BULB TIP NO VENT (SUCTIONS) ×2 IMPLANT

## 2023-07-01 NOTE — Transfer of Care (Signed)
 Immediate Anesthesia Transfer of Care Note  Patient: Thomas Malone  Procedure(s) Performed: INSERTION OF INFLATABLE PENILE PROSTHESIS (Penis)  Patient Location: PACU  Anesthesia Type:General  Level of Consciousness: sedated  Airway & Oxygen Therapy: Patient Spontanous Breathing and Patient connected to face mask oxygen  Post-op Assessment: Report given to RN and Post -op Vital signs reviewed and stable  Post vital signs: Reviewed and stable  Last Vitals:  Vitals Value Taken Time  BP 129/83 07/01/23 0916  Temp    Pulse 79 07/01/23 0919  Resp 17 07/01/23 0919  SpO2 98 % 07/01/23 0919  Vitals shown include unfiled device data.  Last Pain:  Vitals:   07/01/23 0654  TempSrc: Temporal  PainSc: 0-No pain         Complications: No notable events documented.

## 2023-07-01 NOTE — Anesthesia Postprocedure Evaluation (Signed)
 Anesthesia Post Note  Patient: Thomas Malone  Procedure(s) Performed: INSERTION OF INFLATABLE PENILE PROSTHESIS (Penis)     Patient location during evaluation: PACU Anesthesia Type: General Level of consciousness: awake and alert Pain management: pain level controlled Vital Signs Assessment: post-procedure vital signs reviewed and stable Respiratory status: spontaneous breathing, nonlabored ventilation and respiratory function stable Cardiovascular status: blood pressure returned to baseline and stable Postop Assessment: no apparent nausea or vomiting Anesthetic complications: no   No notable events documented.  Last Vitals:  Vitals:   07/01/23 1015 07/01/23 1051  BP: (!) 134/91 118/75  Pulse: 76 75  Resp: 18 18  Temp:  (!) 36.1 C  SpO2: 95% 94%    Last Pain:  Vitals:   07/01/23 1051  TempSrc: Temporal  PainSc: 5                  Erin Havers

## 2023-07-01 NOTE — Anesthesia Procedure Notes (Signed)
 Procedure Name: Intubation Date/Time: 07/01/2023 7:44 AM  Performed by: Arvilla Birmingham, CRNAPre-anesthesia Checklist: Patient identified, Emergency Drugs available, Suction available and Patient being monitored Patient Re-evaluated:Patient Re-evaluated prior to induction Oxygen Delivery Method: Circle system utilized Preoxygenation: Pre-oxygenation with 100% oxygen Induction Type: IV induction Ventilation: Mask ventilation without difficulty Laryngoscope Size: Mac and 4 Grade View: Grade IV Tube type: Oral Number of attempts: 1 Airway Equipment and Method: Stylet, Oral airway and Video-laryngoscopy Placement Confirmation: positive ETCO2, breath sounds checked- equal and bilateral and ETT inserted through vocal cords under direct vision Secured at: 23 cm Tube secured with: Tape Dental Injury: Teeth and Oropharynx as per pre-operative assessment  Difficulty Due To: Difficult Airway- due to immobile epiglottis and Difficult Airway- due to reduced neck mobility Comments: DL x  1 with MAC 4, unable to mobilize epiglottis. Easy visualization with Glidescope

## 2023-07-01 NOTE — Discharge Instructions (Addendum)
 Penile prosthesis postoperative instructions  Wound:  In most cases your incision will have absorbable sutures that will dissolve within the first 10-20 days. Some will fall out even earlier. Expect some redness as the sutures dissolved but this should occur only around the sutures. If there is generalized redness, especially with increasing pain or swelling, let us  know. The scrotum and penis will very likely get "black and blue" as the blood in the tissues spread. Sometimes the whole scrotum will turn colors. The black and blue is followed by a yellow and brown color. In time, all the discoloration will go away. In some cases some firm swelling in the area of the testicle and pump may persist for up to 4-6 weeks after the surgery and is considered normal in most cases.  Drain:   You may be discharged home with a drain in place. If so, you will be taught how to empty it and should keep track of the output. Additionally, you should call the office to arrange for an appointment to have it removed after a few days.   Diet:  You may return to your normal diet within 24 hours following your surgery. You may note some mild nausea and possibly vomiting the first 6-8 hours following surgery. This is usually due to the side effects of anesthesia, and will disappear quite soon. I would suggest clear liquids and a very light meal the first evening following your surgery.  Activity:  Your physical activity should be restricted the first 48 hours. During that time you should remain relatively inactive, moving about only when necessary. During the first 3 weeks following surgery you should avoid lifting any heavy objects (anything greater than 15 pounds), and avoid strenuous exercise. If you work, ask us  specifically about your restrictions, both for work and home. We will write a note to your employer if needed.  Avoid using your penis until your follow up visit with Dr Jarvis Mesa, which will typically be around  3-4 weeks following the surgery. Most people are able to start cycling their device after that appointment, and can have intercourse soon thereafter.   You should plan to wear a tight pair of jockey shorts or an athletic supporter for the first 4-5 days, even to sleep. This will keep the scrotum immobilized to some degree and keep the swelling down.The position of your penis will determine what is most comfortable but I strongly urge you to keep the penis in the "up" position (toward your head). You should continue to tuck "up" your penis when possible for the first 3 months following surgery.  Ice packs should be placed on and off over the scrotum for the first 48 hours. Frozen peas or corn in a ZipLock bag can be frozen, used and re-frozen. Fifteen minutes on and 15 minutes off is a reasonable schedule. The ice is a good pain reliever and keeps the swelling down.  Hygiene:  You may shower 48 hours after your surgery. Tub bathing should be restricted until the wound is completely healed, typically around 2-3 weeks.  Medication:  You will be sent home with some type of pain medication. In many cases you will be sent home with a strong anti-inflammatory medication (Celebrex, Meloxicam) and a narcotic pain pill (hydrocodone or oxycodone ). You can also supplement these medications with tylenol  (acetaminophen ). If the pain medication you are sent home with does not control the pain, please notify the office Problems you should report to us :  Fever of 101.0 degrees  Fahrenheit or greater. Moderate or severe swelling under the skin incision or involving the scrotum. Drug reaction such as hives, a rash, nausea or vomiting.   May take Tylenol  after 1pm, if needed.    Post Anesthesia Home Care Instructions  Activity: Get plenty of rest for the remainder of the day. A responsible individual must stay with you for 24 hours following the procedure.  For the next 24 hours, DO NOT: -Drive a  car -Advertising copywriter -Drink alcoholic beverages -Take any medication unless instructed by your physician -Make any legal decisions or sign important papers.  Meals: Start with liquid foods such as gelatin or soup. Progress to regular foods as tolerated. Avoid greasy, spicy, heavy foods. If nausea and/or vomiting occur, drink only clear liquids until the nausea and/or vomiting subsides. Call your physician if vomiting continues.  Special Instructions/Symptoms: Your throat may feel dry or sore from the anesthesia or the breathing tube placed in your throat during surgery. If this causes discomfort, gargle with warm salt water . The discomfort should disappear within 24 hours.  If you had a scopolamine patch placed behind your ear for the management of post- operative nausea and/or vomiting:  1. The medication in the patch is effective for 72 hours, after which it should be removed.  Wrap patch in a tissue and discard in the trash. Wash hands thoroughly with soap and water . 2. You may remove the patch earlier than 72 hours if you experience unpleasant side effects which may include dry mouth, dizziness or visual disturbances. 3. Avoid touching the patch. Wash your hands with soap and water  after contact with the patch.     About my Jackson-Pratt Bulb Drain  What is a Jackson-Pratt bulb? A Jackson-Pratt is a soft, round device used to collect drainage. It is connected to a long, thin drainage catheter, which is held in place by one or two small stiches near your surgical incision site. When the bulb is squeezed, it forms a vacuum, forcing the drainage to empty into the bulb.  Emptying the Jackson-Pratt bulb- To empty the bulb: 1. Release the plug on the top of the bulb. 2. Pour the bulb's contents into a measuring container which your nurse will provide. 3. Record the time emptied and amount of drainage. Empty the drain(s) as often as your     doctor or nurse recommends.  Date                   Time                    Amount (Drain 1)                 Amount (Drain 2)  _____________________________________________________________________  _____________________________________________________________________  _____________________________________________________________________  _____________________________________________________________________  _____________________________________________________________________  _____________________________________________________________________  _____________________________________________________________________  _____________________________________________________________________  Squeezing the Jackson-Pratt Bulb- To squeeze the bulb: 1. Make sure the plug at the top of the bulb is open. 2. Squeeze the bulb tightly in your fist. You will hear air squeezing from the bulb. 3. Replace the plug while the bulb is squeezed. 4. Use a safety pin to attach the bulb to your clothing. This will keep the catheter from     pulling at the bulb insertion site.  When to call your doctor- Call your doctor if: Drain site becomes red, swollen or hot. You have a fever greater than 101 degrees F. There is oozing at the drain site. Drain falls out (apply a guaze bandage over the  drain hole and secure it with tape). Drainage increases daily not related to activity patterns. (You will usually have more drainage when you are active than when you are resting.) Drainage has a bad odor.

## 2023-07-01 NOTE — Op Note (Signed)
 PATIENT:  Thomas Malone  PRE-OPERATIVE DIAGNOSIS:  Organic erectile dysfunction  POST-OPERATIVE DIAGNOSIS:  Same, Peyronie's disease  PROCEDURE:   3 piece inflatable penile prosthesis (BS/AMS)  SURGEON:  Julene Oaks MD  ASST: Alphonza Ashing, MD  INDICATION: He has had long-standing organic erectile dysfunction and refractory to other modes of treatment. He has elected to proceed with prosthesis implantation.  ANESTHESIA:  General  EBL:  50 cc  Device: 3 piece AMS CX 700: 95 cc reservoir, 21 cm cylinders and 1 cm rear-tip extenders on right and left sides  LOCAL MEDICATIONS USED:   penile block done with 10 cc lidocaine /marcaine  mixture  SPECIMEN: None  Description of procedure: The patient was taken to the major operating room, placed on the table and administered general anesthesia in the supine position. His genitalia was then prepped with chlorhexidine  x 2. He was draped in the usual sterile fashion, and I used Ioban on the field. An official timeout was then performed.  A dorsal penile block was performed. A butterfly needle was then used to inject normal saline into the penis to give an artificial erection. There ~15-20 degrees of dorsal curvature with some hourglassing more proximally, slight left lean of glans. I then injected 10 cc of lidocaine /marcaine  into the penis.   A 14 French coude catheter was then placed in the bladder and the bladder was drained and the catheter was plugged. A midline penoscrotal incision was then made and the dissection was carried down to the corpora and urethra. The lonestar retractor was positioned so as to have excellent exposure. 2-0 Vicryl sutures were then placed proximally in each corpus cavernosum to serve as stay sutures. An incision was then made in the corpus cavernosum first on the left-hand side with the bovie. Seabron [place] were used to gently dilate the opening. I then dilated the corpus cavernosum with the a 12 Fr brooks dilator distally and  proximally. Field goal post tests were performed and there was no evidence of perforations or crossover. I then irrigated the corpus cavernosum with antibiotic solution and measured the distance proximally and distally from the stay suture and was found to be 11 and 11 cm, respectively.I then turned my attention to the contralateral corpus cavernosum and placed my stay sutures, made my corporotomy and dilated the corpus cavernosum in an identical fashion. This was measured and also was found to be 12 cm proximally and 10 distally. It was irrigated with anastomotic solution as was the scrotum. I then chose an 21 cm cylinder set with 1 cm rear-tip extenders and these were prepped while I prepared the site for reservoir placement.  I then digitally probed into the Left external inguinal ring. My finger was used to poke through the posterior wall of the ring. I used my finger to ensure I was in the appropriate space, and to clear room for the reservoir. I irrigated the space with anastomotic solution and then placed the reservoir in this location. I then filled the reservoir with 95 cc of sterile saline, and checked to confirm proper position. There was minimal backpressure with the reservoir max-filled.  Attention was redirected to the corporotomies where the cylinders were then placed by first fixing the suture to the distal aspect of the right cylinder to a straight needle. This was then loaded on the [facility] inserter and passed through the corporotomy and distally. I then advanced the straight needle with the Furlow inserter out through the glans and this was grasped with a hemostat and  pulled through the glans and the suture was secured with a hemostat. I then performed an identical maneuver on the contralateral side. After this was performed I irrigated both corpus cavernosum; there was no evidence of urethral perforation. I inserted the distal portion of the cylinder through the corporotomies and pulled this  to the end of the corpora with the suture. The proximal aspect with the rear-tip extender was then passed through the corporotomy and into the seated position on each side. I then connected reservoir tubing to a syringe filled with sterile saline and inflated the device. I noted a good straight erection with both cylinders equidistant under the glans and no buckling of the cylinders. I therefore deflated the device and closed the corporotomies with used my previously placed stay sutures.   I then grasped the scrotal skin in the midline with a babcock, and used a hemostat to dissect down to the dependent-most portion of the scrotum. The nasal speculum was inserted into this space, and facilitated placement of the pump. The cylinder was then connected to the pump after excising the excess tubing with appropriate shodded hemostats in place and then I used the supplied connectors to make the connection. I then again cycled the device with the pump and it cycled properly. I deflated the device and pumped it up about three quarters of the way to aid with hemostasis. I irrigated the wound one last time with antibiotic irrigation and then closed the deep scrotal tissue over the tubing and pump with running 3-0 monocryl suture. I placed a 10 Fr blake drain over the corporotomies. A second layer was then closed over this first layer with running 3- 0 monocryl, and running skin suture w/ 4-0 monocryl performed. Incision dressed with dermabond.  A mummy wrap was applied. The catheter was removed, and drain connected to suction bulb and the patient was awakened and taken recovery room in stable and satisfactory condition. He tolerated the procedure well and there were no intraoperative complications. Needle sponge and instrument counts were correct at the end of the operation.

## 2023-07-01 NOTE — H&P (Signed)
 H&P  History of Present Illness: Thomas Malone is a 73 y.o. year old M who presents today for insertion of an inflatable penile prosthesis  No acute complaints  Past Medical History:  Diagnosis Date   Abnormal CT of the chest 01/23/2014   BPH (benign prostatic hyperplasia) 05/31/2018   Complication of anesthesia    hard to wake up    Diabetes mellitus without complication (HCC)    Elevated PSA    Erectile dysfunction    Gastroesophageal reflux    H/O sarcoidosis    Headache    7-8 months from gabapentin  and naproxen   Hypertension    Pneumonia of upper lobe of lung 12/1213   Sleep apnea     Past Surgical History:  Procedure Laterality Date   ANTERIOR CERVICAL DECOMP/DISCECTOMY FUSION N/A 05/28/2018   CATARACT EXTRACTION W/PHACO Left 04/11/2019   Procedure: CATARACT EXTRACTION PHACO AND INTRAOCULAR LENS PLACEMENT (IOC) (CDE:5.80);  Surgeon: Tarri Farm, MD;  Location: [facility];  Service: Ophthalmology;  Laterality: Left;   CATARACT EXTRACTION W/PHACO Right 06/03/2019   Procedure: CATARACT EXTRACTION PHACO AND INTRAOCULAR LENS PLACEMENT RIGHT EYE;  Surgeon: Tarri Farm, MD;  Location: [facility];  Service: Ophthalmology;  Laterality: Right;  CDE: 9.82   COLONOSCOPY     COLONOSCOPY N/A 06/23/2014   Procedure: COLONOSCOPY;  Surgeon: Alanda Allegra Md, MD;  [facility]: [facility];  Service: Gastroenterology;  Laterality: N/A;   ELBOW SURGERY Right    2021   EYE SURGERY Bilateral    lasic   ORTHOPEDIC SURGERY Left    heel surgery   PROSTATE BIOPSY     SHOULDER ARTHROSCOPY Right    repair of tendons and ligaments   TONSILLECTOMY     ULNAR NERVE TRANSPOSITION Right 08/28/2022   Procedure: RIGHT ULNAR NERVE DECOMPRESSION WITH TRANSPOSITION POSSIBLE SUBMUSCULAR TRANSPOSITION;  Surgeon: Brunilda Capra, MD;  Location: [facility];  Service: Orthopedics;  Laterality: Right;  120 MIN   WRIST SURGERY     2021    Home Medications:  Current Meds  Medication Sig    alfuzosin  (UROXATRAL ) 10 MG 24 hr tablet Take 1 tablet (10 mg total) by mouth daily with breakfast.   amLODipine  (NORVASC ) 10 MG tablet Take 0.5 tablets (5 mg total) by mouth daily.   aspirin EC 81 MG tablet Take 81 mg by mouth daily. Swallow whole.   chlorthalidone  (HYGROTON ) 25 MG tablet Take 1 tablet (25 mg total) by mouth daily.   cyanocobalamin  (VITAMIN B12) 1000 MCG/ML injection Inject 1 mL (1,000 mcg total) into the muscle every 30 (thirty) days.   finasteride  (PROSCAR ) 5 MG tablet Take 1 tablet (5 mg total) by mouth daily.   glipiZIDE  (GLUCOTROL ) 5 MG tablet Take 1 tablet (5 mg total) by mouth every morning.   Glucosamine-Chondroitin (COSAMIN DS PO) Take 1 tablet by mouth daily.   irbesartan  (AVAPRO ) 150 MG tablet Take 1 tablet (150 mg total) by mouth daily.   Multiple Vitamin (MULTIVITAMIN WITH MINERALS) TABS tablet Take 1 tablet by mouth daily.   omeprazole  (PRILOSEC) 40 MG capsule Take 1 capsule (40 mg total) by mouth every other day.   pravastatin  (PRAVACHOL ) 20 MG tablet Take 20 mg by mouth daily.    Allergies:  Allergies  Allergen Reactions   Diclofenac Anaphylaxis   Diclofenac Sodium Anaphylaxis    Topical gel   Levaquin [Levofloxacin In D5w] Anaphylaxis   Lidocaine  Anaphylaxis    Family History  Problem Relation Age of Onset   Lymphoma Mother  Heart disease Father    Hyperlipidemia Sister    Diabetes Sister    Heart disease Sister    Breast cancer Sister    Hyperlipidemia Brother    Diabetes Brother     Social History:  reports that he has quit smoking. His smoking use included cigars. He has never used smokeless tobacco. He reports that he does not drink alcohol  and does not use drugs.  ROS: A complete review of systems was performed.  All systems are negative except for pertinent findings as noted.  Physical Exam:  Vital signs in last 24 hours: Temp:  [97.9 F (36.6 C)] 97.9 F (36.6 C) (05/28 0654) Pulse Rate:  [66] 66 (05/28 0654) Resp:  [15] 15  (05/28 0654) BP: (136)/(79) 136/79 (05/28 0654) SpO2:  [100 %] 100 % (05/28 0654) Weight:  [108.2 kg] 108.2 kg (05/28 0654) Constitutional:  Alert and oriented, No acute distress Cardiovascular: Regular rate and rhythm Respiratory: Normal respiratory effort, Lungs clear bilaterally GI: Abdomen is soft, nontender, nondistended, no abdominal masses Lymphatic: No lymphadenopathy Neurologic: Grossly intact, no focal deficits Psychiatric: Normal mood and affect   Laboratory Data:  No results for input(s): "WBC", "HGB", "HCT", "PLT" in the last 72 hours.  Recent Labs    06/30/23 1130  NA 140  K 3.8  CL 106  GLUCOSE 170*  BUN 22  CALCIUM 9.4  CREATININE 2.10*     Results for orders placed or performed during the hospital encounter of 07/01/23 (from the past 24 hours)  Basic metabolic panel per protocol     Status: Abnormal   Collection Time: 06/30/23 11:30 AM  Result Value Ref Range   Sodium 140 135 - 145 mmol/L   Potassium 3.8 3.5 - 5.1 mmol/L   Chloride 106 98 - 111 mmol/L   CO2 26 22 - 32 mmol/L   Glucose, Bld 170 (H) 70 - 99 mg/dL   BUN 22 8 - 23 mg/dL   Creatinine, Ser 8.29 (H) 0.61 - 1.24 mg/dL   Calcium 9.4 8.9 - 56.2 mg/dL   GFR, Estimated 33 (L) >60 mL/min   Anion gap 8 5 - 15  Glucose, capillary     Status: Abnormal   Collection Time: 07/01/23  6:53 AM  Result Value Ref Range   Glucose-Capillary 140 (H) 70 - 99 mg/dL   No results found for this or any previous visit (from the past 240 hours).  Renal Function: Recent Labs    06/30/23 1130  CREATININE 2.10*   Estimated Creatinine Clearance: 38.9 mL/min (A) (by C-G formula based on SCr of 2.1 mg/dL (H)).  Radiologic Imaging: No results found.  Assessment:  Thomas Malone is a 73 y.o. year old M with ED refractory to other medical treatments  Plan:  To OR as planned for IPP. Procedure and risks reviewed, including but not limited to bleeding, infection, implant infection, implant malfunction, implant  malplacement, erosion, damage to adjacent structures, pain, urinary retention. Patient has allergies to both lidocaine  and NSAIDs; we discussed he will likely have more pain than average afterwards. All questions answered   Julene Oaks, MD 07/01/2023, 7:24 AM  [facility] Pager: [phone]

## 2023-07-02 ENCOUNTER — Encounter: Payer: Self-pay | Admitting: Hematology

## 2023-07-02 ENCOUNTER — Encounter (HOSPITAL_BASED_OUTPATIENT_CLINIC_OR_DEPARTMENT_OTHER): Payer: Self-pay | Admitting: Urology

## 2023-07-20 ENCOUNTER — Encounter: Payer: Self-pay | Admitting: Internal Medicine

## 2023-07-20 NOTE — Assessment & Plan Note (Addendum)
 Lipid panel last updated in July 2024.  Total cholesterol 153 and LDL 93.  Pravastatin  was previously increased to 20 mg daily in light of this result.  Repeat lipid panel ordered today.

## 2023-07-20 NOTE — Assessment & Plan Note (Signed)
 Seen by nephrology for follow-up last month.  Labs are stable.  He remains on irbesartan .

## 2023-07-20 NOTE — Assessment & Plan Note (Signed)
 Remains adequately controlled on current antihypertensive regimen.

## 2023-07-20 NOTE — Assessment & Plan Note (Signed)
 A1c 7.6 on labs from July 2024.  He is currently prescribed glipizide  5 mg daily.  Repeat A1c ordered today.

## 2023-07-20 NOTE — Assessment & Plan Note (Signed)
Symptoms remain well-controlled with omeprazole. 

## 2023-07-28 ENCOUNTER — Encounter: Payer: Self-pay | Admitting: Hematology

## 2023-07-28 ENCOUNTER — Other Ambulatory Visit

## 2023-07-28 DIAGNOSIS — R972 Elevated prostate specific antigen [PSA]: Secondary | ICD-10-CM

## 2023-07-29 LAB — PSA: Prostate Specific Ag, Serum: 10.5 ng/mL — ABNORMAL HIGH (ref 0.0–4.0)

## 2023-07-31 ENCOUNTER — Ambulatory Visit: Payer: PRIVATE HEALTH INSURANCE | Admitting: Urology

## 2023-08-04 ENCOUNTER — Ambulatory Visit: Payer: Self-pay | Admitting: Urology

## 2023-08-24 ENCOUNTER — Encounter: Payer: Self-pay | Admitting: Hematology

## 2023-08-28 ENCOUNTER — Ambulatory Visit: Payer: PRIVATE HEALTH INSURANCE | Admitting: Urology

## 2023-08-28 VITALS — BP 111/72 | HR 91

## 2023-08-28 DIAGNOSIS — R972 Elevated prostate specific antigen [PSA]: Secondary | ICD-10-CM | POA: Diagnosis not present

## 2023-08-28 DIAGNOSIS — R351 Nocturia: Secondary | ICD-10-CM

## 2023-08-28 DIAGNOSIS — N401 Enlarged prostate with lower urinary tract symptoms: Secondary | ICD-10-CM

## 2023-08-28 DIAGNOSIS — N138 Other obstructive and reflux uropathy: Secondary | ICD-10-CM

## 2023-08-28 LAB — URINALYSIS, ROUTINE W REFLEX MICROSCOPIC
Bilirubin, UA: NEGATIVE
Glucose, UA: NEGATIVE
Ketones, UA: NEGATIVE
Leukocytes,UA: NEGATIVE
Nitrite, UA: NEGATIVE
Protein,UA: NEGATIVE
RBC, UA: NEGATIVE
Specific Gravity, UA: 1.015 (ref 1.005–1.030)
Urobilinogen, Ur: 0.2 mg/dL (ref 0.2–1.0)
pH, UA: 6 (ref 5.0–7.5)

## 2023-08-28 MED ORDER — ALFUZOSIN HCL ER 10 MG PO TB24: 10.0000 mg | ORAL_TABLET | Freq: Every day | ORAL | 3 refills | Status: DC

## 2023-08-28 MED ORDER — FINASTERIDE 5 MG PO TABS: 5.0000 mg | ORAL_TABLET | Freq: Every day | ORAL | 3 refills | Status: DC

## 2023-08-28 NOTE — Progress Notes (Signed)
 08/28/2023 1:08 PM   Ryan HERO Mulford 1950-07-29 [id number]  Referring provider: Melvenia Manus BRAVO, MD [address] [employer] [place],  [place] [address]  Elevated PSA   HPI: Mr Sciandra is a 73yo here for followup fro elevated PSA and nocturia. Nocturia 0-2x depending on fluid consumption. IPSS 1 QOL 0 on finasteride  5mg  daily and uroxatral  10mg  daily. Urine stream strong. No straining to urinate.    PMH: Past Medical History:  Diagnosis Date   Abnormal CT of the chest 01/23/2014   BPH (benign prostatic hyperplasia) 05/31/2018   Complication of anesthesia    hard to wake up    Diabetes mellitus without complication (HCC)    Elevated PSA    Erectile dysfunction    Gastroesophageal reflux    H/O sarcoidosis    Headache    7-8 months from gabapentin  and naproxen   Hypertension    Pneumonia of upper lobe of lung 12/1213   Sleep apnea     Surgical History: Past Surgical History:  Procedure Laterality Date   ANTERIOR CERVICAL DECOMP/DISCECTOMY FUSION N/A 05/28/2018   CATARACT EXTRACTION W/PHACO Left 04/11/2019   Procedure: CATARACT EXTRACTION PHACO AND INTRAOCULAR LENS PLACEMENT (IOC) (CDE:5.80);  Surgeon: Harrie Agent, MD;  Location: [facility];  Service: Ophthalmology;  Laterality: Left;   CATARACT EXTRACTION W/PHACO Right 06/03/2019   Procedure: CATARACT EXTRACTION PHACO AND INTRAOCULAR LENS PLACEMENT RIGHT EYE;  Surgeon: Harrie Agent, MD;  Location: [facility];  Service: Ophthalmology;  Laterality: Right;  CDE: 9.82   COLONOSCOPY     COLONOSCOPY N/A 06/23/2014   Procedure: COLONOSCOPY;  Surgeon: Oneil Budge Md, MD;  [facility];  Service: Gastroenterology;  Laterality: N/A;   ELBOW SURGERY Right    2021   EYE SURGERY Bilateral    lasic   ORTHOPEDIC SURGERY Left    heel surgery   PENILE PROSTHESIS IMPLANT N/A 07/01/2023   Procedure: INSERTION OF INFLATABLE PENILE PROSTHESIS;  Surgeon: Lovie Arlyss CROME, MD;  Location: [facility];  Service: Urology;   Laterality: N/A;  120 MINUTES NEEDED FOR CASE   PROSTATE BIOPSY     SHOULDER ARTHROSCOPY Right    repair of tendons and ligaments   TONSILLECTOMY     ULNAR NERVE TRANSPOSITION Right 08/28/2022   Procedure: RIGHT ULNAR NERVE DECOMPRESSION WITH TRANSPOSITION POSSIBLE SUBMUSCULAR TRANSPOSITION;  Surgeon: Murrell Drivers, MD;  Location: [facility];  Service: Orthopedics;  Laterality: Right;  120 MIN   WRIST SURGERY     2021    Home Medications:  Allergies as of 08/28/2023       Reactions   Diclofenac Anaphylaxis   Diclofenac Sodium Anaphylaxis   Topical gel   Levaquin [levofloxacin In D5w] Anaphylaxis   Lidocaine  Anaphylaxis        Medication List        Accurate as of August 28, 2023  1:08 PM. If you have any questions, ask your nurse or doctor.          acetaminophen  500 MG tablet Commonly known as: TYLENOL  Take 2 tablets (1,000 mg total) by mouth every 8 (eight) hours.   alfuzosin  10 MG 24 hr tablet Commonly known as: UROXATRAL  Take 1 tablet (10 mg total) by mouth daily with breakfast.   amLODipine  10 MG tablet Commonly known as: NORVASC  Take 0.5 tablets (5 mg total) by mouth daily.   aspirin EC 81 MG tablet Take 81 mg by mouth daily. Swallow whole.   BD Disp Needles 25G X 5/8  Misc Generic drug: NEEDLE (DISP) 25 G Patient to use with B 12 injection   chlorthalidone  25 MG tablet Commonly known as: HYGROTON  Take 1 tablet (25 mg total) by mouth daily.   COSAMIN DS PO Take 1 tablet by mouth daily.   cyanocobalamin  1000 MCG/ML injection Commonly known as: VITAMIN B12 Inject 1 mL (1,000 mcg total) into the muscle every 30 (thirty) days.   finasteride  5 MG tablet Commonly known as: PROSCAR  Take 1 tablet (5 mg total) by mouth daily.   freestyle lancets 1 each by Other route 2 (two) times daily.   FREESTYLE LITE test strip Generic drug: glucose blood SMARTSIG:Via Meter   glipiZIDE  5 MG tablet Commonly known as: GLUCOTROL  Take 1 tablet (5 mg  total) by mouth every morning.   irbesartan  150 MG tablet Commonly known as: AVAPRO  Take 1 tablet (150 mg total) by mouth daily.   multivitamin with minerals Tabs tablet Take 1 tablet by mouth daily.   omeprazole  40 MG capsule Commonly known as: PRILOSEC Take 1 capsule (40 mg total) by mouth every other day.   oxyCODONE  5 MG immediate release tablet Commonly known as: Roxicodone  Take 1 tablet (5 mg total) by mouth every 6 (six) hours as needed.   pravastatin  20 MG tablet Commonly known as: PRAVACHOL  Take 20 mg by mouth daily.        Allergies:  Allergies  Allergen Reactions   Diclofenac Anaphylaxis   Diclofenac Sodium Anaphylaxis    Topical gel   Levaquin [Levofloxacin In D5w] Anaphylaxis   Lidocaine  Anaphylaxis    Family History: Family History  Problem Relation Age of Onset   Lymphoma Mother    Heart disease Father    Hyperlipidemia Sister    Diabetes Sister    Heart disease Sister    Breast cancer Sister    Hyperlipidemia Brother    Diabetes Brother     Social History:  reports that he has quit smoking. His smoking use included cigars. He has never used smokeless tobacco. He reports that he does not drink alcohol  and does not use drugs.  ROS: All other review of systems were reviewed and are negative except what is noted above in HPI  Physical Exam: BP 111/72   Pulse 91   Constitutional:  Alert and oriented, No acute distress. HEENT: [place] AT, moist mucus membranes.  Trachea midline, no masses. Cardiovascular: No clubbing, cyanosis, or edema. Respiratory: Normal respiratory effort, no increased work of breathing. GI: Abdomen is soft, nontender, nondistended, no abdominal masses GU: No CVA tenderness.  Lymph: No cervical or inguinal lymphadenopathy. Skin: No rashes, bruises or suspicious lesions. Neurologic: Grossly intact, no focal deficits, moving all 4 extremities. Psychiatric: Normal mood and affect.  Laboratory Data: Lab Results  Component  Value Date   WBC 6.1 05/08/2023   HGB 11.9 (L) 05/08/2023   HCT 35.3 (L) 05/08/2023   MCV 83.1 05/08/2023   PLT 212 05/08/2023    Lab Results  Component Value Date   CREATININE 2.10 (H) 06/30/2023    Lab Results  Component Value Date   PSA 6.7 (H) 06/22/2019    No results found for: TESTOSTERONE   Lab Results  Component Value Date   HGBA1C 7.9 (H) 06/25/2023    Urinalysis    Component Value Date/Time   COLORURINE STRAW (A) 11/15/2016 0617   APPEARANCEUR Clear 04/24/2023 1009   LABSPEC 1.009 11/15/2016 0617   PHURINE 7.0 11/15/2016 0617   GLUCOSEU Negative 04/24/2023 1009   HGBUR NEGATIVE 11/15/2016 0617  BILIRUBINUR Negative 04/24/2023 1009   KETONESUR NEGATIVE 11/15/2016 0617   PROTEINUR Negative 04/24/2023 1009   PROTEINUR NEGATIVE 11/15/2016 0617   UROBILINOGEN 0.2 06/22/2019 1104   NITRITE Negative 04/24/2023 1009   NITRITE NEGATIVE 11/15/2016 0617   LEUKOCYTESUR Negative 04/24/2023 1009    Lab Results  Component Value Date   LABMICR Comment 04/24/2023    Pertinent Imaging:  No results found for this or any previous visit.  No results found for this or any previous visit.  No results found for this or any previous visit.  No results found for this or any previous visit.  Results for orders placed during the hospital encounter of 12/24/20  US  RENAL  Narrative CLINICAL DATA:  Chronic kidney disease.  EXAM: RENAL / URINARY TRACT ULTRASOUND COMPLETE  COMPARISON:  None.  FINDINGS: Right Kidney:  Renal measurements: 11.5 x 6.1 x 5.2 cm = volume: 188 mL. There is diffuse increased renal parenchymal echogenicity. No hydronephrosis or shadowing stone. There is a 5 cm upper pole cyst.  Left Kidney:  Renal measurements: 11.9 x 6.6 x 5.4 cm = volume: 222 mL. Normal echogenicity. No hydronephrosis or shadowing stone.  Bladder:  Appears normal for degree of bladder distention.  Other:  None.  IMPRESSION: 1. Echogenic right kidney a 5  cm upper pole cyst. No hydronephrosis or shadowing stone. 2. Unremarkable left kidney and urinary bladder.   Electronically Signed By: Vanetta Chou M.D. On: 12/24/2020 23:22  No results found for this or any previous visit.  No results found for this or any previous visit.  No results found for this or any previous visit.   Assessment & Plan:    1. Elevated PSA (Primary) Followup 6 months with PSA - Urinalysis, Routine w reflex microscopic  2. Benign prostatic hyperplasia with urinary obstruction Continue finasteride  5mg  daily  3. Nocturia Continue finasteride  5mg  and uroxatral  10mg     No follow-ups on file.  Belvie Clara, MD  [facility] [place]

## 2023-09-02 ENCOUNTER — Encounter: Payer: Self-pay | Admitting: Urology

## 2023-09-02 NOTE — Patient Instructions (Signed)

## 2023-09-16 ENCOUNTER — Other Ambulatory Visit: Payer: Self-pay | Admitting: Oncology

## 2023-09-28 ENCOUNTER — Ambulatory Visit (INDEPENDENT_AMBULATORY_CARE_PROVIDER_SITE_OTHER): Payer: PRIVATE HEALTH INSURANCE

## 2023-09-28 VITALS — BP 141/93 | HR 57 | Ht 69.0 in | Wt 240.0 lb

## 2023-09-28 DIAGNOSIS — H9193 Unspecified hearing loss, bilateral: Secondary | ICD-10-CM | POA: Diagnosis not present

## 2023-09-28 DIAGNOSIS — N184 Chronic kidney disease, stage 4 (severe): Secondary | ICD-10-CM

## 2023-09-28 DIAGNOSIS — Z7984 Long term (current) use of oral hypoglycemic drugs: Secondary | ICD-10-CM

## 2023-09-28 DIAGNOSIS — E1122 Type 2 diabetes mellitus with diabetic chronic kidney disease: Secondary | ICD-10-CM

## 2023-09-28 MED ORDER — GLIPIZIDE 5 MG PO TABS
5.0000 mg | ORAL_TABLET | Freq: Every morning | ORAL | 3 refills | Status: DC
Start: 2023-09-28 — End: 2023-10-30

## 2023-09-28 NOTE — Progress Notes (Unsigned)
 Established Patient Office Visit  Subjective   Patient ID: Thomas Malone, male    DOB: 12-28-1950  Age: 73 y.o. MRN: [id number]  Chief Complaint  Patient presents with   Medical Management of Chronic Issues    3 month follow up    HPI  Patient Active Problem List   Diagnosis Date Noted   Need for influenza vaccination 12/22/2022   Anemia 11/14/2022   Abnormal auditory perception of right ear 11/07/2022   Type 2 diabetes mellitus with diabetic chronic kidney disease (HCC) 08/06/2022   HLD (hyperlipidemia) 08/06/2022   GERD (gastroesophageal reflux disease) 08/06/2022   Fatigue 08/06/2022   Bilateral lower extremity edema 08/06/2022   OSA on CPAP 02/10/2022   Allergic rhinitis 05/09/2021   DOE (dyspnea on exertion) 03/28/2021   Elevated PSA 06/22/2019   Dysphagia 05/31/2018   Chronic kidney disease, stage 4 (severe) (HCC) 05/31/2018   BPH (benign prostatic hyperplasia) 05/31/2018   Postoperative seroma involving digestive system after non-digestive system procedure 05/30/2018   H/O sarcoidosis    Essential hypertension    Erectile dysfunction       ROS    Objective:     BP (!) 141/93   Pulse (!) 57   Ht 5' 9 (1.753 m)   Wt 240 lb (108.9 kg)   SpO2 95%   BMI 35.44 kg/m  BP Readings from Last 3 Encounters:  09/28/23 (!) 141/93  08/28/23 111/72  07/01/23 118/75   Wt Readings from Last 3 Encounters:  09/28/23 240 lb (108.9 kg)  07/01/23 238 lb 8.6 oz (108.2 kg)  06/25/23 237 lb 12.8 oz (107.9 kg)     Physical Exam Vitals and nursing note reviewed.  Constitutional:      Appearance: Normal appearance.  HENT:     Head: Normocephalic.  Eyes:     Extraocular Movements: Extraocular movements intact.     Pupils: Pupils are equal, round, and reactive to light.  Cardiovascular:     Rate and Rhythm: Normal rate and regular rhythm.  Pulmonary:     Effort: Pulmonary effort is normal.     Breath sounds: Normal breath sounds.  Musculoskeletal:     Cervical  back: Normal range of motion and neck supple.  Neurological:     Mental Status: He is alert and oriented to person, place, and time.  Psychiatric:        Mood and Affect: Mood normal.        Thought Content: Thought content normal.      No results found for any visits on 09/28/23.  Last CBC Lab Results  Component Value Date   WBC 6.1 05/08/2023   HGB 11.9 (L) 05/08/2023   HCT 35.3 (L) 05/08/2023   MCV 83.1 05/08/2023   MCH 28.0 05/08/2023   RDW 12.3 05/08/2023   PLT 212 05/08/2023   Last metabolic panel Lab Results  Component Value Date   GLUCOSE 170 (H) 06/30/2023   NA 140 06/30/2023   K 3.8 06/30/2023   CL 106 06/30/2023   CO2 26 06/30/2023   BUN 22 06/30/2023   CREATININE 2.10 (H) 06/30/2023   GFRNONAA 33 (L) 06/30/2023   CALCIUM 9.4 06/30/2023   PROT 7.4 05/08/2023   ALBUMIN 3.8 05/08/2023   LABGLOB 3.3 05/08/2023   AGRATIO 1.1 05/08/2023   BILITOT 1.1 05/08/2023   ALKPHOS 71 05/08/2023   AST 25 05/08/2023   ALT 18 05/08/2023   ANIONGAP 8 06/30/2023   Last lipids Lab Results  Component Value Date  CHOL 132 06/25/2023   HDL 32 (L) 06/25/2023   LDLCALC 78 06/25/2023   TRIG 122 06/25/2023   CHOLHDL 4.1 06/25/2023   Last hemoglobin A1c Lab Results  Component Value Date   HGBA1C 7.9 (H) 06/25/2023   Last thyroid  functions Lab Results  Component Value Date   TSH 2.120 08/06/2022   Last vitamin D  Lab Results  Component Value Date   VD25OH 44.09 11/14/2022   Last vitamin B12 and Folate Lab Results  Component Value Date   VITAMINB12 1,100 (H) 05/08/2023   FOLATE 16.0 11/14/2022      The 10-year ASCVD risk score (Arnett DK, et al., 2019) is: 40.8%    Assessment & Plan:   Problem List Items Addressed This Visit       Endocrine   Type 2 diabetes mellitus with diabetic chronic kidney disease (HCC)    No follow-ups on file.    Leita Longs, FNP

## 2023-09-29 LAB — B12 AND FOLATE PANEL
Folate: 17.2 ng/mL (ref >3.0)
Vitamin B-12: 1732 pg/mL — ABNORMAL HIGH (ref 232–1245)

## 2023-09-30 LAB — LIPID PANEL
Chol/HDL Ratio: 3.6 ratio (ref 0.0–5.0)
Cholesterol, Total: 145 mg/dL (ref 100–199)
HDL: 40 mg/dL (ref >39)
LDL Chol Calc (NIH): 87 mg/dL (ref 0–99)
Triglycerides: 97 mg/dL (ref 0–149)
VLDL Cholesterol Cal: 18 mg/dL (ref 5–40)

## 2023-09-30 LAB — HEMOGLOBIN A1C
Est. average glucose Bld gHb Est-mCnc: 194 mg/dL
Hgb A1c MFr Bld: 8.4 % — ABNORMAL HIGH (ref 4.8–5.6)

## 2023-09-30 LAB — CMP14+EGFR
ALT: 11 IU/L (ref 0–44)
AST: 14 IU/L (ref 0–40)
Albumin: 4.1 g/dL (ref 3.8–4.8)
Alkaline Phosphatase: 108 IU/L (ref 44–121)
BUN/Creatinine Ratio: 9 — ABNORMAL LOW (ref 10–24)
BUN: 22 mg/dL (ref 8–27)
Bilirubin Total: 0.7 mg/dL (ref 0.0–1.2)
CO2: 24 mmol/L (ref 20–29)
Calcium: 9.6 mg/dL (ref 8.6–10.2)
Chloride: 104 mmol/L (ref 96–106)
Creatinine, Ser: 2.46 mg/dL — ABNORMAL HIGH (ref 0.76–1.27)
Globulin, Total: 2.9 g/dL (ref 1.5–4.5)
Glucose: 159 mg/dL — ABNORMAL HIGH (ref 70–99)
Potassium: 4.4 mmol/L (ref 3.5–5.2)
Sodium: 142 mmol/L (ref 134–144)
Total Protein: 7 g/dL (ref 6.0–8.5)
eGFR: 27 mL/min/1.73 — ABNORMAL LOW (ref >59)

## 2023-09-30 LAB — MICROALBUMIN / CREATININE URINE RATIO
Creatinine, Urine: 74.1 mg/dL
Microalb/Creat Ratio: 8 mg/g{creat} (ref 0–29)
Microalbumin, Urine: 5.8 ug/mL

## 2023-10-02 DIAGNOSIS — H9193 Unspecified hearing loss, bilateral: Secondary | ICD-10-CM | POA: Insufficient documentation

## 2023-10-02 NOTE — Assessment & Plan Note (Signed)
 A1c 7.9 on labs from May 2025.  He is currently prescribed glipizide  5 mg daily.  Repeat A1c ordered today.

## 2023-10-02 NOTE — Assessment & Plan Note (Signed)
 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 Slight improvement noted. - Refer to audiologist for hearing test. - Refer to ENT specialist, preference for [place] or [place].

## 2023-10-22 ENCOUNTER — Other Ambulatory Visit: Payer: Self-pay | Admitting: *Deleted

## 2023-10-22 MED ORDER — BD DISP NEEDLES 25G X 5/8" MISC: 0 refills | Status: AC

## 2023-10-22 MED ORDER — CYANOCOBALAMIN 1000 MCG/ML IJ SOLN: 1000.0000 ug | INTRAMUSCULAR | 0 refills | Status: AC

## 2023-10-29 ENCOUNTER — Other Ambulatory Visit: Payer: Self-pay

## 2023-10-29 ENCOUNTER — Other Ambulatory Visit: Payer: Self-pay | Admitting: Family Medicine

## 2023-10-29 ENCOUNTER — Telehealth: Payer: Self-pay | Admitting: Internal Medicine

## 2023-10-29 ENCOUNTER — Ambulatory Visit: Payer: Self-pay | Admitting: Nurse Practitioner

## 2023-10-29 DIAGNOSIS — I10 Essential (primary) hypertension: Secondary | ICD-10-CM

## 2023-10-29 MED ORDER — AMLODIPINE BESYLATE 10 MG PO TABS: 5.0000 mg | ORAL_TABLET | Freq: Every day | ORAL | 3 refills | Status: AC

## 2023-10-29 NOTE — Telephone Encounter (Signed)
 Medication sent to pharmacy

## 2023-10-29 NOTE — Telephone Encounter (Signed)
 Patient came by office need med refill  amLODipine  (NORVASC ) 10 MG tablet [[id number]]   [facility] [place]

## 2023-10-30 ENCOUNTER — Telehealth: Admitting: Family Medicine

## 2023-10-30 DIAGNOSIS — N1832 Chronic kidney disease, stage 3b: Secondary | ICD-10-CM | POA: Diagnosis not present

## 2023-10-30 DIAGNOSIS — E1122 Type 2 diabetes mellitus with diabetic chronic kidney disease: Secondary | ICD-10-CM | POA: Diagnosis not present

## 2023-10-30 DIAGNOSIS — E1140 Type 2 diabetes mellitus with diabetic neuropathy, unspecified: Secondary | ICD-10-CM | POA: Insufficient documentation

## 2023-10-30 DIAGNOSIS — E1142 Type 2 diabetes mellitus with diabetic polyneuropathy: Secondary | ICD-10-CM | POA: Diagnosis not present

## 2023-10-30 MED ORDER — GLIPIZIDE 10 MG PO TABS: 10.0000 mg | ORAL_TABLET | Freq: Two times a day (BID) | ORAL | 3 refills | Status: AC

## 2023-10-30 MED ORDER — DULOXETINE HCL 30 MG PO CPEP: 30.0000 mg | ORAL_CAPSULE | Freq: Every day | ORAL | 3 refills | Status: DC

## 2023-10-30 MED ORDER — EMPAGLIFLOZIN 10 MG PO TABS: 10.0000 mg | ORAL_TABLET | Freq: Every day | ORAL | 2 refills | Status: DC

## 2023-10-30 NOTE — Progress Notes (Signed)
 Virtual Visit via Video Note  I connected with Thomas Malone on 10/30/23 at  1:40 PM EDT by a video enabled telemedicine application and verified that I am speaking with the correct person using two identifiers.  Patient Location: Home Provider Location: Home Office  I discussed the limitations, risks, security, and privacy concerns of performing an evaluation and management service by video and the availability of in person appointments. I also discussed with the patient that there may be a patient responsible charge related to this service. The patient expressed understanding and agreed to proceed.  Subjective: PCP: Melvenia Manus BRAVO, MD  No chief complaint on file.  Patient presents via telehealth for diabetic neuropathy. For the details of today's visit, please refer to assessment and plan.    ROS: Per HPI  Current Outpatient Medications:    DULoxetine  (CYMBALTA ) 30 MG capsule, Take 1 capsule (30 mg total) by mouth daily., Disp: 30 capsule, Rfl: 3   glipiZIDE  (GLUCOTROL ) 10 MG tablet, Take 1 tablet (10 mg total) by mouth 2 (two) times daily before a meal., Disp: 180 tablet, Rfl: 3   alfuzosin  (UROXATRAL ) 10 MG 24 hr tablet, Take 1 tablet (10 mg total) by mouth daily with breakfast., Disp: 90 tablet, Rfl: 3   amLODipine  (NORVASC ) 10 MG tablet, Take 0.5 tablets (5 mg total) by mouth daily., Disp: 90 tablet, Rfl: 3   aspirin EC 81 MG tablet, Take 81 mg by mouth daily. Swallow whole., Disp: , Rfl:    chlorthalidone  (HYGROTON ) 25 MG tablet, Take 1 tablet (25 mg total) by mouth daily., Disp: 90 tablet, Rfl: 3   cyanocobalamin  (VITAMIN B12) 1000 MCG/ML injection, Inject 1 mL (1,000 mcg total) into the muscle every 30 (thirty) days., Disp: 6 mL, Rfl: 0   finasteride  (PROSCAR ) 5 MG tablet, Take 1 tablet (5 mg total) by mouth daily., Disp: 90 tablet, Rfl: 3   FREESTYLE LITE test strip, SMARTSIG:Via Meter, Disp: 100 each, Rfl: 3   Glucosamine-Chondroitin (COSAMIN DS PO), Take 1 tablet by  mouth daily., Disp: , Rfl:    irbesartan  (AVAPRO ) 150 MG tablet, Take 1 tablet (150 mg total) by mouth daily., Disp: 90 tablet, Rfl: 3   Lancets (FREESTYLE) lancets, 1 each by Other route 2 (two) times daily., Disp: 100 each, Rfl: 3   Multiple Vitamin (MULTIVITAMIN WITH MINERALS) TABS tablet, Take 1 tablet by mouth daily., Disp: , Rfl:    NEEDLE, DISP, 25 G (BD DISP NEEDLES) 25G X 5/8 MISC, Patient to use with B 12 injection, Disp: 12 each, Rfl: 0   omeprazole  (PRILOSEC) 40 MG capsule, Take 1 capsule (40 mg total) by mouth every other day., Disp: 90 capsule, Rfl: 3   pravastatin  (PRAVACHOL ) 20 MG tablet, Take 20 mg by mouth daily., Disp: , Rfl:   Observations/Objective: There were no vitals filed for this visit. Physical Exam Patient is alert and no acute distress noted.   Assessment and Plan: Diabetic polyneuropathy associated with type 2 diabetes mellitus (HCC) Assessment & Plan: Numbs and tingling on both feet  Happens during the night for the past 5-6 months and gradually worsening Patient reports can not take gabapentin  Trial on Cymbalta  30 mg once daily   Orders: -     DULoxetine  HCl; Take 1 capsule (30 mg total) by mouth daily.  Dispense: 30 capsule; Refill: 3  Type 2 diabetes mellitus with stage 3b chronic kidney disease, without long-term current use of insulin  (HCC) Assessment & Plan: The patient's recent hemoglobin A1c is 8.4%,  indicating poor glycemic control. The plan is to increase Glipizide  to 10 mg twice daily with meals and for optimal control   Orders: -     glipiZIDE ; Take 1 tablet (10 mg total) by mouth 2 (two) times daily before a meal.  Dispense: 180 tablet; Refill: 3    Follow Up Instructions: No follow-ups on file.   I discussed the assessment and treatment plan with the patient. The patient was provided an opportunity to ask questions, and all were answered. The patient agreed with the plan and demonstrated an understanding of the instructions.   The  patient was advised to call back or seek an in-person evaluation if the symptoms worsen or if the condition fails to improve as anticipated.  The above assessment and management plan was discussed with the patient. The patient verbalized understanding of and has agreed to the management plan.   Thomas Kidd Wilhelmena Falter, FNP

## 2023-10-30 NOTE — Assessment & Plan Note (Addendum)
 The patient's recent hemoglobin A1c is 8.4%, indicating poor glycemic control. The plan is to increase Glipizide  to 10 mg twice daily with meals and for optimal control

## 2023-10-30 NOTE — Assessment & Plan Note (Signed)
 Numbs and tingling on both feet  Happens during the night for the past 5-6 months and gradually worsening Patient reports can not take gabapentin  Trial on Cymbalta  30 mg once daily

## 2023-10-30 NOTE — Progress Notes (Deleted)
   Established Patient Office Visit   Subjective  Patient ID: Thomas Malone, male    DOB: 01/06/1951  Age: 73 y.o. MRN: [id number]  No chief complaint on file.   He  has a past medical history of Abnormal CT of the chest (01/23/2014), BPH (benign prostatic hyperplasia) (05/31/2018), Complication of anesthesia, Diabetes mellitus without complication (HCC), Elevated PSA, Erectile dysfunction, Gastroesophageal reflux, H/O sarcoidosis, Headache, Hypertension, Pneumonia of upper lobe of lung (12/1213), and Sleep apnea.  Irrgengalr menense    ROS    Objective:     There were no vitals taken for this visit. {Vitals History (Optional):23777}  Physical Exam   No results found for any visits on 10/30/23.  The 10-year ASCVD risk score (Arnett DK, et al., 2019) is: 39.5%    Assessment & Plan:  There are no diagnoses linked to this encounter.  No follow-ups on file.   [name], FNP

## 2023-11-02 ENCOUNTER — Ambulatory Visit: Admitting: Family Medicine

## 2023-11-06 ENCOUNTER — Encounter (INDEPENDENT_AMBULATORY_CARE_PROVIDER_SITE_OTHER): Payer: Self-pay | Admitting: Physician Assistant

## 2023-11-06 ENCOUNTER — Ambulatory Visit (INDEPENDENT_AMBULATORY_CARE_PROVIDER_SITE_OTHER): Admitting: Audiology

## 2023-11-06 ENCOUNTER — Ambulatory Visit (INDEPENDENT_AMBULATORY_CARE_PROVIDER_SITE_OTHER): Admitting: Physician Assistant

## 2023-11-06 VITALS — BP 132/88 | HR 67 | Temp 97.7°F | Ht 70.0 in | Wt 240.0 lb

## 2023-11-06 DIAGNOSIS — H906 Mixed conductive and sensorineural hearing loss, bilateral: Secondary | ICD-10-CM

## 2023-11-06 DIAGNOSIS — H9391 Unspecified disorder of right ear: Secondary | ICD-10-CM

## 2023-11-06 DIAGNOSIS — H6591 Unspecified nonsuppurative otitis media, right ear: Secondary | ICD-10-CM

## 2023-11-06 DIAGNOSIS — H903 Sensorineural hearing loss, bilateral: Secondary | ICD-10-CM

## 2023-11-06 NOTE — Progress Notes (Signed)
 Dear Dr. Bevely, Here is my assessment for our mutual patient, Thomas Malone. Thank you for allowing me the opportunity to care for your patient. Please do not hesitate to contact me should you have any other questions. Sincerely, Chyrl Cohen PA-C  Otolaryngology Clinic Note Referring provider: Dr. Bevely HPI:  Thomas Malone is a 73 y.o. male kindly referred by Dr. Bevely   The patient is a 73 year old gentleman seen in our office for evaluation of decreased hearing.  The patient notes that he has worked in [employer] his whole life.  He feels like he has had a slow progressive loss of hearing bilateral.  He notes that several years ago he was injured and was placed on gabapentin  and another unknown medication.  He felt like his hearing got worse after taking his medications as well.  He notes the last month he felt like both of his ears were full, clogged, decreased hearing.  He notes that he was started on nasal spray and Valsalva, this caused his ears to pop he notes his hearing returned to normal on the left, he notes some persistent decreased hearing on the right when compared to left.  He denies associated pain, no drainage, no preceding infection.  He notes some baseline generalized dizziness, no vertigo but attributes this to elevated blood sugars and medications.  He denies any throat pain, changes to voice.  He notes bilateral tunnel tinnitus that is worse in quiet environments.  He reports a history of seasonal allergies and uses Flonase occasionally, does not feel congested today.   Independent Review of Additional Tests or Records:  Audiological evaluation 11/06/2023    Otoscopy: Right ear: Clear external ear canal and notable landmarks visualized on the tympanic membrane. Left ear:  Clear external ear canal and notable landmarks visualized on the tympanic membrane.   Tympanometry: Right ear: Type C- Normal external ear canal volume with negative middle ear pressure and normal  tympanic membrane compliance. Left ear: Normal external ear canal volume with negative middle ear pressure and reduced tympanic membrane compliance.   Pure tone Audiometry: Right ear- Normal to severe mixed hearing loss from 125 Hz - 8000 Hz. Left ear-  Normal to moderate mixed hearing loss from 125 Hz - 8000 Hz.   Speech Audiometry: Right ear- Speech Reception Threshold (SRT) was obtained at 20 dBHL. Left ear-Speech Reception Threshold (SRT) was obtained at 25 dBHL.   Word Recognition Score Tested using NU-6 (recorded) Right ear: 100% was obtained at a presentation level of 65 dBHL with contralateral masking which is deemed as  excellent. Left ear: 100% was obtained at a presentation level of 65 dBHL with contralateral masking which is deemed as  excellent.   The hearing test results were completed under headphones and results are deemed to be of good reliability. Test technique:  conventional     CT cervical spine-  PMH/Meds/All/SocHx/FamHx/ROS:   Past Medical History:  Diagnosis Date   Abnormal CT of the chest 01/23/2014   BPH (benign prostatic hyperplasia) 05/31/2018   Complication of anesthesia    hard to wake up    Diabetes mellitus without complication (HCC)    Elevated PSA    Erectile dysfunction    Gastroesophageal reflux    H/O sarcoidosis    Headache    7-8 months from gabapentin  and naproxen   Hypertension    Pneumonia of upper lobe of lung 12/1213   Sleep apnea      Past Surgical History:  Procedure Laterality Date  ANTERIOR CERVICAL DECOMP/DISCECTOMY FUSION N/A 05/28/2018   CATARACT EXTRACTION W/PHACO Left 04/11/2019   Procedure: CATARACT EXTRACTION PHACO AND INTRAOCULAR LENS PLACEMENT (IOC) (CDE:5.80);  Surgeon: Harrie Agent, MD;  Location: [facility];  Service: Ophthalmology;  Laterality: Left;   CATARACT EXTRACTION W/PHACO Right 06/03/2019   Procedure: CATARACT EXTRACTION PHACO AND INTRAOCULAR LENS PLACEMENT RIGHT EYE;  Surgeon: Harrie Agent, MD;   Location: [facility];  Service: Ophthalmology;  Laterality: Right;  CDE: 9.82   COLONOSCOPY     COLONOSCOPY N/A 06/23/2014   Procedure: COLONOSCOPY;  Surgeon: Oneil Budge Md, MD;  Location: [facility];  Service: Gastroenterology;  Laterality: N/A;   ELBOW SURGERY Right    2021   EYE SURGERY Bilateral    lasic   ORTHOPEDIC SURGERY Left    heel surgery   PENILE PROSTHESIS IMPLANT N/A 07/01/2023   Procedure: INSERTION OF INFLATABLE PENILE PROSTHESIS;  Surgeon: Lovie Arlyss CROME, MD;  Location: [facility];  Service: Urology;  Laterality: N/A;  120 MINUTES NEEDED FOR CASE   PROSTATE BIOPSY     SHOULDER ARTHROSCOPY Right    repair of tendons and ligaments   TONSILLECTOMY     ULNAR NERVE TRANSPOSITION Right 08/28/2022   Procedure: RIGHT ULNAR NERVE DECOMPRESSION WITH TRANSPOSITION POSSIBLE SUBMUSCULAR TRANSPOSITION;  Surgeon: Murrell Drivers, MD;  Location: [facility];  Service: Orthopedics;  Laterality: Right;  120 MIN   WRIST SURGERY     2021    Family History  Problem Relation Age of Onset   Lymphoma Mother    Heart disease Father    Hyperlipidemia Sister    Diabetes Sister    Heart disease Sister    Breast cancer Sister    Hyperlipidemia Brother    Diabetes Brother      Social Connections: Moderately Integrated (10/07/2022)   Social Connection and Isolation Panel    Frequency of Communication with Friends and Family: More than three times a week    Frequency of Social Gatherings with Friends and Family: More than three times a week    Attends Religious Services: More than 4 times per year    Active Member of [employer] or Organizations: No    Attends Banker Meetings: Never    Marital Status: Married      Current Outpatient Medications:    alfuzosin  (UROXATRAL ) 10 MG 24 hr tablet, Take 1 tablet (10 mg total) by mouth daily with breakfast., Disp: 90 tablet, Rfl: 3   amLODipine  (NORVASC ) 10 MG tablet, Take 0.5 tablets (5 mg total) by mouth  daily., Disp: 90 tablet, Rfl: 3   aspirin EC 81 MG tablet, Take 81 mg by mouth daily. Swallow whole., Disp: , Rfl:    chlorthalidone  (HYGROTON ) 25 MG tablet, Take 1 tablet (25 mg total) by mouth daily., Disp: 90 tablet, Rfl: 3   cyanocobalamin  (VITAMIN B12) 1000 MCG/ML injection, Inject 1 mL (1,000 mcg total) into the muscle every 30 (thirty) days., Disp: 6 mL, Rfl: 0   DULoxetine  (CYMBALTA ) 30 MG capsule, Take 1 capsule (30 mg total) by mouth daily., Disp: 30 capsule, Rfl: 3   finasteride  (PROSCAR ) 5 MG tablet, Take 1 tablet (5 mg total) by mouth daily., Disp: 90 tablet, Rfl: 3   FREESTYLE LITE test strip, SMARTSIG:Via Meter, Disp: 100 each, Rfl: 3   glipiZIDE  (GLUCOTROL ) 10 MG tablet, Take 1 tablet (10 mg total) by mouth 2 (two) times daily before a meal., Disp: 180 tablet, Rfl: 3   Glucosamine-Chondroitin (COSAMIN DS PO), Take 1  tablet by mouth daily., Disp: , Rfl:    irbesartan  (AVAPRO ) 150 MG tablet, Take 1 tablet (150 mg total) by mouth daily., Disp: 90 tablet, Rfl: 3   Lancets (FREESTYLE) lancets, 1 each by Other route 2 (two) times daily., Disp: 100 each, Rfl: 3   Multiple Vitamin (MULTIVITAMIN WITH MINERALS) TABS tablet, Take 1 tablet by mouth daily., Disp: , Rfl:    NEEDLE, DISP, 25 G (BD DISP NEEDLES) 25G X 5/8 MISC, Patient to use with B 12 injection, Disp: 12 each, Rfl: 0   omeprazole  (PRILOSEC) 40 MG capsule, Take 1 capsule (40 mg total) by mouth every other day., Disp: 90 capsule, Rfl: 3   pravastatin  (PRAVACHOL ) 20 MG tablet, Take 20 mg by mouth daily., Disp: , Rfl:    Physical Exam:   BP 132/88   Pulse 67   Temp 97.7 F (36.5 C)   Ht 5' 10 (1.778 m)   Wt 240 lb (108.9 kg)   SpO2 92%   BMI 34.44 kg/m   Pertinent Findings  CN II-XII intact Right EAC clear, TM intact, mucoid effusion, left EAC clear, TM intact with well-pneumatized middle ear space Anterior rhinoscopy: Septum midline; bilateral inferior turbinates with moderate hypertrophy No lesions of oral  cavity/oropharynx; dentition within normal limits No obviously palpable neck masses/lymphadenopathy/thyromegaly No respiratory distress or stridor Anterior neck with scarring  Seprately Identifiable Procedures:  Procedure Note Pre-procedure diagnosis: Right mucoid effusion Post-procedure diagnosis: Same Procedure: Transnasal Fiberoptic Laryngoscopy, CPT 68424 - Mod 25 Indication: see above Complications: None apparent EBL: 0 mL   The procedure was undertaken to further evaluate the patient's complaint of right mucoid effusion, with mirror exam inadequate for appropriate examination due to gag reflex and poor patient tolerance   Procedure:  Patient was identified as correct patient. Verbal consent was obtained. The nose was not sprayed given his allergy to lidocaine . The The flexible laryngoscope was passed through the nose to view the nasal cavity, pharynx (oropharynx, hypopharynx) and larynx.  The larynx was examined at rest and during multiple phonatory tasks. Documentation was obtained and reviewed with patient. The scope was removed. The patient tolerated the procedure well.   Findings: The nasal cavity and nasopharynx did not reveal any masses or lesions, mucosa appeared to be without obvious lesions. The tongue base, pharyngeal walls, piriform sinuses, vallecula, epiglottis and postcricoid region are normal in appearance with exception of protrusion of the oropharynx The visualized portion of the subglottis and proximal trachea is widely patent although limited view due to patient anatomy and compliance due to no topical anesthetics. The vocal folds are mobile bilaterally.. There are no lesions on the free edge of the vocal folds nor elsewhere in the larynx worrisome for malignancy.        Impression & Plans:  Thomas Malone is a 73 y.o. male with the following   Decreased hearing-  Bilateral sensorineural hearing loss.  The patient did have air-bone gaps bilateral predominantly  from 5000 through 1000 Hz.  On exam he had right sided mucoid effusion which correlates with his right sided ear fullness.  No infectious signs or symptoms.  Nasal endoscopy was performed, the patient was anaphylactically allergic to lidocaine  so was unable to use this this provided somewhat limited exam but I was able to visualize the opening to the right and left eustachian tubes which were clear.  He did have some bulging in the oropharynx, this did not appear to be any concerning lesion or mass.  He is status post  C3-7 ACDF, I reviewed a previous CT cervical spine from April 2020 which showed anterior osteophytes, this is likely what I am seeing on my physical exam today.  The patient has no associated neck pain, no changes to voice, no infectious signs or symptoms.  He understands if he develops any he should reach out to me immediately.  Otherwise I would recommend he complete a trial of Flonase, Claritin, saline irrigations, and ear popping.  I like to see him back in the office in 3 months with repeat audiological evaluation or sooner as needed.  The patient verbalized understanding and agreement to this plan had no further questions or concerns.   - f/u 66-month office visit with audiological evaluation    Thank you for allowing me the opportunity to care for your patient. Please do not hesitate to contact me should you have any other questions.  Sincerely, Chyrl Cohen PA-C [facility] Phone: [phone] Fax: [phone]  11/06/2023, 9:15 AM

## 2023-11-06 NOTE — Progress Notes (Signed)
  [address], [address] [place], [place] [address] [phone]  Audiological Evaluation    Name: Thomas Malone     DOB:   07/03/1950      MRN:   [id number]                                                                                     Service Date: 11/06/2023     Accompanied by: wife   Patient comes today after [name], PA-C sent a referral for a hearing evaluation due to concerns with hearing loss.   Symptoms Yes Details  Hearing loss  [x]  Hearing loss in both ears with onset 8 months ago.  Tinnitus  []  Sometimes, mostly the right  Ear pain/ infections/pressure  [x]  Clogged sensation, today feels worse in the right ear.  Balance problems  []    Noise exposure history  [x]  Worked in [employer]  Previous ear surgeries  []    Family history of hearing loss  []    Amplification  []    Other  []      Otoscopy: Right ear: Clear external ear canal and notable landmarks visualized on the tympanic membrane. Left ear:  Clear external ear canal and notable landmarks visualized on the tympanic membrane.  Tympanometry: Right ear: Type C- Normal external ear canal volume with negative middle ear pressure and normal tympanic membrane compliance. Left ear: Normal external ear canal volume with negative middle ear pressure and reduced tympanic membrane compliance.  Pure tone Audiometry: Right ear- Normal to severe mixed hearing loss from 125 Hz - 8000 Hz. Left ear-  Normal to moderate mixed hearing loss from 125 Hz - 8000 Hz.  Speech Audiometry: Right ear- Speech Reception Threshold (SRT) was obtained at 20 dBHL. Left ear-Speech Reception Threshold (SRT) was obtained at 25 dBHL.   Word Recognition Score Tested using NU-6 (recorded) Right ear: 100% was obtained at a presentation level of 65 dBHL with contralateral masking which is deemed as  excellent. Left ear: 100% was obtained at a presentation level of 65 dBHL with contralateral masking which is deemed as  excellent.    The hearing test results were completed under headphones and results are deemed to be of good reliability. Test technique:  conventional     Recommendations: Follow up with ENT as scheduled for today. Repeat audiogram after medical care.   Victory Dresden MARIE LEROUX-MARTINEZ, AUD

## 2023-11-08 ENCOUNTER — Other Ambulatory Visit: Payer: Self-pay | Admitting: Internal Medicine

## 2023-11-09 ENCOUNTER — Telehealth (INDEPENDENT_AMBULATORY_CARE_PROVIDER_SITE_OTHER): Payer: Self-pay

## 2023-11-09 ENCOUNTER — Encounter: Payer: Self-pay | Admitting: Oncology

## 2023-11-09 ENCOUNTER — Other Ambulatory Visit (HOSPITAL_COMMUNITY): Payer: Self-pay

## 2023-11-09 MED ORDER — FLUTICASONE PROPIONATE 50 MCG/ACT NA SUSP: 2.0000 | Freq: Every day | NASAL | 6 refills | Status: AC

## 2023-11-09 MED ORDER — LORATADINE 10 MG PO TABS: 10.0000 mg | ORAL_TABLET | Freq: Every day | ORAL | 11 refills | Status: AC

## 2023-11-09 NOTE — Telephone Encounter (Signed)
 Thanks, can you let her know that they should be there now. Sorry for the delay

## 2023-11-09 NOTE — Telephone Encounter (Signed)
 Called Pt wife back let her know she said thank you

## 2023-11-09 NOTE — Addendum Note (Signed)
 Addended byBETHA COHEN, Kristianna Saperstein on: 11/09/2023 11:45 AM   Modules accepted: Orders

## 2023-11-09 NOTE — Telephone Encounter (Signed)
 Pt wife called asking about the meds you prescribed on Friday stated that the [facility] in [place] never received them. She would like a call back.

## 2023-11-13 ENCOUNTER — Inpatient Hospital Stay: Payer: PRIVATE HEALTH INSURANCE | Attending: Oncology

## 2023-11-13 ENCOUNTER — Telehealth: Payer: Self-pay | Admitting: Family Medicine

## 2023-11-13 ENCOUNTER — Other Ambulatory Visit: Payer: Self-pay

## 2023-11-13 DIAGNOSIS — R351 Nocturia: Secondary | ICD-10-CM | POA: Diagnosis not present

## 2023-11-13 DIAGNOSIS — D509 Iron deficiency anemia, unspecified: Secondary | ICD-10-CM | POA: Insufficient documentation

## 2023-11-13 DIAGNOSIS — R779 Abnormality of plasma protein, unspecified: Secondary | ICD-10-CM | POA: Diagnosis present

## 2023-11-13 DIAGNOSIS — D649 Anemia, unspecified: Secondary | ICD-10-CM

## 2023-11-13 DIAGNOSIS — E782 Mixed hyperlipidemia: Secondary | ICD-10-CM

## 2023-11-13 DIAGNOSIS — N1832 Chronic kidney disease, stage 3b: Secondary | ICD-10-CM | POA: Diagnosis not present

## 2023-11-13 DIAGNOSIS — N401 Enlarged prostate with lower urinary tract symptoms: Secondary | ICD-10-CM | POA: Insufficient documentation

## 2023-11-13 DIAGNOSIS — R778 Other specified abnormalities of plasma proteins: Secondary | ICD-10-CM

## 2023-11-13 DIAGNOSIS — N529 Male erectile dysfunction, unspecified: Secondary | ICD-10-CM | POA: Insufficient documentation

## 2023-11-13 DIAGNOSIS — E1122 Type 2 diabetes mellitus with diabetic chronic kidney disease: Secondary | ICD-10-CM

## 2023-11-13 LAB — CBC WITH DIFFERENTIAL/PLATELET
Abs Immature Granulocytes: 0.01 K/uL (ref 0.00–0.07)
Basophils Absolute: 0 K/uL (ref 0.0–0.1)
Basophils Relative: 1 %
Eosinophils Absolute: 0.2 K/uL (ref 0.0–0.5)
Eosinophils Relative: 3 %
HCT: 38.7 % — ABNORMAL LOW (ref 39.0–52.0)
Hemoglobin: 13.1 g/dL (ref 13.0–17.0)
Immature Granulocytes: 0 %
Lymphocytes Relative: 27 %
Lymphs Abs: 1.6 K/uL (ref 0.7–4.0)
MCH: 27.5 pg (ref 26.0–34.0)
MCHC: 33.9 g/dL (ref 30.0–36.0)
MCV: 81.1 fL (ref 80.0–100.0)
Monocytes Absolute: 0.5 K/uL (ref 0.1–1.0)
Monocytes Relative: 8 %
Neutro Abs: 3.6 K/uL (ref 1.7–7.7)
Neutrophils Relative %: 61 %
Platelets: 262 K/uL (ref 150–400)
RBC: 4.77 MIL/uL (ref 4.22–5.81)
RDW: 11.7 % (ref 11.5–15.5)
WBC: 5.9 K/uL (ref 4.0–10.5)
nRBC: 0 % (ref 0.0–0.2)

## 2023-11-13 LAB — COMPREHENSIVE METABOLIC PANEL WITH GFR
ALT: 11 U/L (ref 0–44)
AST: 18 U/L (ref 15–41)
Albumin: 4.3 g/dL (ref 3.5–5.0)
Alkaline Phosphatase: 100 U/L (ref 38–126)
Anion gap: 14 (ref 5–15)
BUN: 37 mg/dL — ABNORMAL HIGH (ref 8–23)
CO2: 26 mmol/L (ref 22–32)
Calcium: 9.9 mg/dL (ref 8.9–10.3)
Chloride: 103 mmol/L (ref 98–111)
Creatinine, Ser: 2.94 mg/dL — ABNORMAL HIGH (ref 0.61–1.24)
GFR, Estimated: 22 mL/min — ABNORMAL LOW (ref >60)
Glucose, Bld: 138 mg/dL — ABNORMAL HIGH (ref 70–99)
Potassium: 3.8 mmol/L (ref 3.5–5.1)
Sodium: 142 mmol/L (ref 135–145)
Total Bilirubin: 0.7 mg/dL (ref 0.0–1.2)
Total Protein: 7.9 g/dL (ref 6.5–8.1)

## 2023-11-13 LAB — VITAMIN B12: Vitamin B-12: >4000 pg/mL — ABNORMAL HIGH (ref 180–914)

## 2023-11-13 LAB — IRON AND TIBC
Iron: 85 ug/dL (ref 45–182)
Saturation Ratios: 28 % (ref 17.9–39.5)
TIBC: 307 ug/dL (ref 250–450)
UIBC: 222 ug/dL

## 2023-11-13 LAB — FERRITIN: Ferritin: 187 ng/mL (ref 24–336)

## 2023-11-13 NOTE — Telephone Encounter (Signed)
 Patient came by office and said this medicine makes him very nausea. Patient is still taking this medication. When he stands up makes him worse, he has to stand there a few minutes before he can move.   And it has brought his blood pressure down really low.   Jardarance

## 2023-11-14 ENCOUNTER — Encounter (HOSPITAL_COMMUNITY): Payer: Self-pay

## 2023-11-14 ENCOUNTER — Other Ambulatory Visit: Payer: Self-pay

## 2023-11-14 ENCOUNTER — Emergency Department (HOSPITAL_COMMUNITY)
Admission: EM | Admit: 2023-11-14 | Discharge: 2023-11-14 | Disposition: A | Discharge status: Home / Self Care | Attending: Emergency Medicine | Admitting: Emergency Medicine

## 2023-11-14 DIAGNOSIS — Z7982 Long term (current) use of aspirin: Secondary | ICD-10-CM | POA: Insufficient documentation

## 2023-11-14 DIAGNOSIS — Z79899 Other long term (current) drug therapy: Secondary | ICD-10-CM | POA: Insufficient documentation

## 2023-11-14 DIAGNOSIS — R55 Syncope and collapse: Secondary | ICD-10-CM | POA: Insufficient documentation

## 2023-11-14 DIAGNOSIS — I10 Essential (primary) hypertension: Secondary | ICD-10-CM | POA: Insufficient documentation

## 2023-11-14 DIAGNOSIS — E119 Type 2 diabetes mellitus without complications: Secondary | ICD-10-CM | POA: Insufficient documentation

## 2023-11-14 LAB — BASIC METABOLIC PANEL WITH GFR
Anion gap: 11 (ref 5–15)
BUN: 40 mg/dL — ABNORMAL HIGH (ref 8–23)
CO2: 24 mmol/L (ref 22–32)
Calcium: 9.3 mg/dL (ref 8.9–10.3)
Chloride: 105 mmol/L (ref 98–111)
Creatinine, Ser: 2.81 mg/dL — ABNORMAL HIGH (ref 0.61–1.24)
GFR, Estimated: 23 mL/min — ABNORMAL LOW (ref >60)
Glucose, Bld: 197 mg/dL — ABNORMAL HIGH (ref 70–99)
Potassium: 3.5 mmol/L (ref 3.5–5.1)
Sodium: 140 mmol/L (ref 135–145)

## 2023-11-14 LAB — CBC WITH DIFFERENTIAL/PLATELET
Abs Immature Granulocytes: 0.02 K/uL (ref 0.00–0.07)
Basophils Absolute: 0 K/uL (ref 0.0–0.1)
Basophils Relative: 1 %
Eosinophils Absolute: 0.2 K/uL (ref 0.0–0.5)
Eosinophils Relative: 3 %
HCT: 36.3 % — ABNORMAL LOW (ref 39.0–52.0)
Hemoglobin: 12.3 g/dL — ABNORMAL LOW (ref 13.0–17.0)
Immature Granulocytes: 0 %
Lymphocytes Relative: 33 %
Lymphs Abs: 2.2 K/uL (ref 0.7–4.0)
MCH: 28 pg (ref 26.0–34.0)
MCHC: 33.9 g/dL (ref 30.0–36.0)
MCV: 82.5 fL (ref 80.0–100.0)
Monocytes Absolute: 0.6 K/uL (ref 0.1–1.0)
Monocytes Relative: 9 %
Neutro Abs: 3.5 K/uL (ref 1.7–7.7)
Neutrophils Relative %: 54 %
Platelets: 232 K/uL (ref 150–400)
RBC: 4.4 MIL/uL (ref 4.22–5.81)
RDW: 11.8 % (ref 11.5–15.5)
WBC: 6.5 K/uL (ref 4.0–10.5)
nRBC: 0 % (ref 0.0–0.2)

## 2023-11-14 LAB — URINALYSIS, ROUTINE W REFLEX MICROSCOPIC
Bacteria, UA: NONE SEEN
Bilirubin Urine: NEGATIVE
Glucose, UA: >=500 mg/dL — AB
Hgb urine dipstick: NEGATIVE
Ketones, ur: NEGATIVE mg/dL
Leukocytes,Ua: NEGATIVE
Nitrite: NEGATIVE
Protein, ur: NEGATIVE mg/dL
Specific Gravity, Urine: 1.011 (ref 1.005–1.030)
pH: 5 (ref 5.0–8.0)

## 2023-11-14 LAB — D-DIMER, QUANTITATIVE: D-Dimer, Quant: 0.42 ug{FEU}/mL (ref 0.00–0.50)

## 2023-11-14 LAB — TROPONIN T, HIGH SENSITIVITY: Troponin T High Sensitivity: 17 ng/L (ref 0–19)

## 2023-11-14 MED ORDER — SODIUM CHLORIDE 0.9 % IV BOLUS
1000.0000 mL | Freq: Once | INTRAVENOUS | Status: AC
  Administered 2023-11-14: 1000 mL via INTRAVENOUS

## 2023-11-14 NOTE — ED Triage Notes (Addendum)
 BIB EMS  from home. Per EMS LOC while getting up going to bathroom hitting right shoulder denies pain at this time or hitting head. CBG 221. Pt given by EMS  A&Ox4. Denies dizziness, headache, nausea, or vomiting at this time.

## 2023-11-14 NOTE — ED Provider Notes (Signed)
 [place] EMERGENCY DEPARTMENT AT [facility] Provider Note   CSN: [id number] Arrival date & time: 11/14/23  9681     Patient presents with: Loss of Consciousness   Thomas Malone is a 73 y.o. male.   The history is provided by the patient, the spouse and a relative.  Loss of Consciousness Associated symptoms: no chest pain, no fever, no headaches, no seizures, no shortness of breath and no vomiting   Patient w/history of diabetes, hypertension presents for syncopal episode.  Patient reports he was sleeping in the front room when he woke up feeling the urge to urinate.  He stood up to go to the bathroom he did have some mild incontinence.  He went to change his clothes and went back to bed.  Soon after he had the urge to urinate again and when he stood up he had a brief syncopal episode and fell into the nightstand.  No head injury.  He is not on anticoagulation.  No seizures reported.  He woke up soon after.  He has not had this before.  He does report he has been having recent fatigue after recently starting Jardiance . Denies any previous history of CAD or CVA  Denies any traumatic injury from the fall Past Medical History:  Diagnosis Date   Abnormal CT of the chest 01/23/2014   BPH (benign prostatic hyperplasia) 05/31/2018   Complication of anesthesia    hard to wake up    Diabetes mellitus without complication (HCC)    Elevated PSA    Erectile dysfunction    Gastroesophageal reflux    H/O sarcoidosis    Headache    7-8 months from gabapentin  and naproxen   Hypertension    Pneumonia of upper lobe of lung 12/1213   Sleep apnea     Prior to Admission medications   Medication Sig Start Date End Date Taking? Authorizing Provider  alfuzosin  (UROXATRAL ) 10 MG 24 hr tablet Take 1 tablet (10 mg total) by mouth daily with breakfast. 08/28/23   McKenzie, Belvie CROME, MD  amLODipine  (NORVASC ) 10 MG tablet Take 0.5 tablets (5 mg total) by mouth daily. 10/29/23   Del Orbe  Polanco, Iliana, FNP  aspirin EC 81 MG tablet Take 81 mg by mouth daily. Swallow whole.    [provider]  chlorthalidone  (HYGROTON ) 25 MG tablet Take 1 tablet (25 mg total) by mouth daily. 03/25/23   Melvenia Manus BRAVO, MD  cyanocobalamin  (VITAMIN B12) 1000 MCG/ML injection Inject 1 mL (1,000 mcg total) into the muscle every 30 (thirty) days. 10/22/23   Geofm Delon BRAVO, NP  DULoxetine  (CYMBALTA ) 30 MG capsule Take 1 capsule (30 mg total) by mouth daily. 10/30/23   Del Wilhelmena Lloyd Sola, FNP  finasteride  (PROSCAR ) 5 MG tablet Take 1 tablet (5 mg total) by mouth daily. 08/28/23   McKenzie, Belvie CROME, MD  fluticasone (FLONASE) 50 MCG/ACT nasal spray Place 2 sprays into both nostrils daily. 11/09/23   Hedges, Reyes, PA-C  FREESTYLE TEST STRIPS test strip USE AS DIRECTED 11/09/23   Antonetta Rollene BRAVO, MD  glipiZIDE  (GLUCOTROL ) 10 MG tablet Take 1 tablet (10 mg total) by mouth 2 (two) times daily before a meal. 10/30/23   Del Wilhelmena Lloyd, [place], FNP  Glucosamine-Chondroitin (COSAMIN DS PO) Take 1 tablet by mouth daily.    [provider]  irbesartan  (AVAPRO ) 150 MG tablet Take 1 tablet (150 mg total) by mouth daily. 12/22/22   Melvenia Manus BRAVO, MD  Lancets (FREESTYLE) lancets USE  1 LANCET TO CHECK GLUCOSE TWICE DAILY 11/09/23   Antonetta Rollene BRAVO, MD  loratadine (CLARITIN) 10 MG tablet Take 1 tablet (10 mg total) by mouth daily. 11/09/23   Hedges, Reyes, PA-C  Multiple Vitamin (MULTIVITAMIN WITH MINERALS) TABS tablet Take 1 tablet by mouth daily.    [provider]  NEEDLE, DISP, 25 G (BD DISP NEEDLES) 25G X 5/8 MISC Patient to use with B 12 injection 10/22/23   Burns, Delon BRAVO, NP  omeprazole  (PRILOSEC) 40 MG capsule Take 1 capsule (40 mg total) by mouth every other day. 12/22/22   Melvenia Manus BRAVO, MD  pravastatin  (PRAVACHOL ) 20 MG tablet Take 20 mg by mouth daily. 03/22/23   [provider]    Allergies: Diclofenac, Diclofenac sodium, Levaquin [levofloxacin in  d5w], and Lidocaine     Review of Systems  Constitutional:  Positive for fatigue. Negative for fever.  Respiratory:  Negative for shortness of breath.   Cardiovascular:  Positive for syncope. Negative for chest pain.  Gastrointestinal:  Negative for abdominal pain and vomiting.  Musculoskeletal:  Negative for arthralgias, back pain and neck pain.  Neurological:  Positive for syncope. Negative for seizures and headaches.    Updated Vital Signs BP 125/85   Pulse 78   Temp 97.8 F (36.6 C) (Oral)   Resp 16   Ht 1.778 m (5' 10)   Wt 108.9 kg   SpO2 93%   BMI 34.44 kg/m   Physical Exam CONSTITUTIONAL: Well developed/well nourished HEAD: Normocephalic/atraumatic, no visible head trauma EYES: EOMI/PERRL ENMT: Mucous membranes moist, no facial trauma or tongue lacerations NECK: supple no meningeal signs SPINE/BACK:entire spine nontender No bruising/crepitance/stepoffs noted to spine CV: S1/S2 noted, no murmurs/rubs/gallops noted LUNGS: Lungs are clear to auscultation bilaterally, no apparent distress ABDOMEN: soft, nontender, no rebound or guarding, bowel sounds noted throughout abdomen GU:no cva tenderness NEURO: Pt is awake/alert/appropriate, moves all extremitiesx4.  No facial droop.  No arm or leg drift.  No confusion and mental status appropriate EXTREMITIES: pulses normal/equal, full ROM, no deformities SKIN: warm, color normal PSYCH: no abnormalities of mood noted, alert and oriented to situation  (all labs ordered are listed, but only abnormal results are displayed) Labs Reviewed  CBC WITH DIFFERENTIAL/PLATELET - Abnormal; Notable for the following components:      Result Value   Hemoglobin 12.3 (*)    HCT 36.3 (*)    All other components within normal limits  BASIC METABOLIC PANEL WITH GFR - Abnormal; Notable for the following components:   Glucose, Bld 197 (*)    BUN 40 (*)    Creatinine, Ser 2.81 (*)    GFR, Estimated 23 (*)    All other components within normal  limits  URINALYSIS, ROUTINE W REFLEX MICROSCOPIC - Abnormal; Notable for the following components:   Color, Urine STRAW (*)    Glucose, UA >=500 (*)    All other components within normal limits  D-DIMER, QUANTITATIVE  TROPONIN T, HIGH SENSITIVITY    EKG: EKG Interpretation Date/Time:  Saturday November 14 2023 04:02:13 EDT Ventricular Rate:  64 PR Interval:  177 QRS Duration:  161 QT Interval:  443 QTC Calculation: 458 R Axis:   -9  Text Interpretation: Sinus rhythm Right bundle branch block No significant change since last tracing Confirmed by Midge Golas ([id number]) on 11/14/2023 4:19:04 AM  Radiology: No results found.   Procedures   Medications Ordered in the ED  sodium chloride  0.9 % bolus 1,000 mL (0 mLs Intravenous Stopped 11/14/23 0550)  sodium  chloride 0.9 % bolus 1,000 mL (0 mLs Intravenous Stopped 11/14/23 0550)    Clinical Course as of 11/14/23 0641  Sat Nov 14, 2023  0446 Creatinine(!): 2.81 Renal insufficiency [DW]  0450 Patient presents after syncopal episode.  He reports recently feeling fatigued every time he sat down.  His PCP suspected it was related to Jardiance  use which he has now stopped Family reports at home he had difficulty obtaining a blood pressure His blood pressure did drop to the upper 90s here upon standing. I suspect this may be related to orthostatic hypotension.  His labs are near baseline though he does have chronic renal insufficiency We will give IV fluids and reassess [DW]  0640 Patient feeling improved.  No tachycardia upon ambulation.  He has steady gait.  Pulse ox has remained in the mid 90s.  I did check a D-dimer, which was negative so unlikely to be a PE.  No signs of any dysrhythmia. [DW]  [phone] Suspect this is related to his Jardiance  use as well as multi antihypertensives He has been instructed to stop the Jardiance , and to hold all of his hypertension meds for the next 24 hours He was also instructed to follow-up with his  PCP [DW]    Clinical Course User Index [DW] Midge Golas, MD                       [facility] Syncope Rule Score: 0          Medical Decision Making Amount and/or Complexity of Data Reviewed Labs: ordered. Decision-making details documented in ED Course. ECG/medicine tests: ordered.   This patient presents to the ED for concern of syncope, this involves an extensive number of treatment options, and is a complaint that carries with it a high risk of complications and morbidity.  The differential diagnosis includes but is not limited to static hypotension, vasovagal syncope, cardiac arrhythmia, seizure, AAA, pulmonary embolism, adverse medication reaction  Comorbidities that complicate the patient evaluation: Patient's presentation is complicated by their history of diabetes and hypertension  Social Determinants of Health: Patient's former tobacco use  increases the complexity of managing their presentation  Additional history obtained: Additional history obtained from family and spouse Records reviewed Primary Care Documents Patient was recently advised to stop Jardiance  due to fatigue  Lab Tests: I Ordered, and personally interpreted labs.  The pertinent results include: Chronic renal failure  Cardiac Monitoring: The patient was maintained on a cardiac monitor.  I personally viewed and interpreted the cardiac monitor which showed an underlying rhythm of:  sinus rhythm  Medicines ordered and prescription drug management: I ordered medication including IV fluids for dehydration Reevaluation of the patient after these medicines showed that the patient    improved  Test Considered: CT head was considered, but no signs of trauma to his head and he is not anticoagulated  Critical Interventions:   IV fluids  Reevaluation: After the interventions noted above, I reevaluated the patient and found that they have :improved  Complexity of problems addressed: Patient's  presentation is most consistent with  acute presentation with potential threat to life or bodily function  Disposition: After consideration of the diagnostic results and the patient's response to treatment,  I feel that the patent would benefit from discharge  .        Final diagnoses:  Syncope and collapse    ED Discharge Orders     None  Midge Golas, MD 11/14/23 [phone]

## 2023-11-14 NOTE — ED Notes (Signed)
 Pt ambulated with pulse ox per EDP request. Pt's VS stayed WNL however, pt was breathing heavy during the walk. EDP made aware.

## 2023-11-14 NOTE — Discharge Instructions (Addendum)
 Please stop your amlodipine  (norvasc ) chlorthalidone  (hygroton ) and irbesartan  for the next 24 hours Stop your jardiance   If you are feeling better on Sunday, you can restart the Norvasc , Hygroton  and irbesartan  Please follow-up with Dr. Antonetta in a week

## 2023-11-16 ENCOUNTER — Ambulatory Visit: Payer: Self-pay

## 2023-11-16 LAB — KAPPA/LAMBDA LIGHT CHAINS
Kappa free light chain: 57.3 mg/L — ABNORMAL HIGH (ref 3.3–19.4)
Kappa, lambda light chain ratio: 1.05 (ref 0.26–1.65)
Lambda free light chains: 54.8 mg/L — ABNORMAL HIGH (ref 5.7–26.3)

## 2023-11-16 LAB — PROTEIN ELECTROPHORESIS, SERUM
A/G Ratio: 1 (ref 0.7–1.7)
Albumin ELP: 3.7 g/dL (ref 2.9–4.4)
Alpha-1-Globulin: 0.2 g/dL (ref 0.0–0.4)
Alpha-2-Globulin: 0.7 g/dL (ref 0.4–1.0)
Beta Globulin: 1.2 g/dL (ref 0.7–1.3)
Gamma Globulin: 1.5 g/dL (ref 0.4–1.8)
Globulin, Total: 3.6 g/dL (ref 2.2–3.9)
Total Protein ELP: 7.3 g/dL (ref 6.0–8.5)

## 2023-11-16 NOTE — Telephone Encounter (Signed)
 Pt instrcuted by ED to stop taking medication. Has er follow up 10/20

## 2023-11-16 NOTE — Telephone Encounter (Signed)
 Pt wife informed

## 2023-11-16 NOTE — Telephone Encounter (Signed)
  FYI Only or Action Required?: Action required by provider: clinical question for provider and update on patient condition.  Patient was last seen in primary care on 10/30/2023 by Terry Wilhelmena Lloyd Hilario, FNP.  Called Nurse Triage reporting Hypotension.  Symptoms began today.  Interventions attempted: Rest, hydration, or home remedies.  Symptoms are: gradually improving.  Triage Disposition: See PCP When Office is Open (Within 3 Days)  Patient/caregiver understands and will follow disposition?: Yes, but will wait   Copied from CRM [id number]. Topic: Clinical - Medical Advice >> Nov 16, 2023  1:22 PM Ahlexyia S wrote: Reason for CRM: Pt is wanting to know if he needs to do anything different with his blood pressure medicine. Pt stated that his blood pressure dropped while he was sleeping to 85/70. Informed pt someone will give him a call shortly. Reason for Disposition  Patient wants doctor (or NP/PA) to measure BP  Answer Assessment - Initial Assessment Questions Additional info: 1) Glipizide  was recently changed from 5mg  to10mg  bid. Spouse is  wondering if this is the correct dose for him and if this could be cause of syncope. BS 90 at noon, Current blood sugar 108.  2) Hospital follow up visit is scheduled on 11/23/23.  3) Patient questions for PCP today. Do  I need to adjust blood pressure medication for bp 85/70. Could glipizide  cause syncope.    1. BLOOD PRESSURE: What is your blood pressure? Did you take at least two measurements 5 minutes apart?     85/70 at noon had been napping, recheck while on this call 112/84, HR 100 2. ONSET: When did you take your blood pressure?    12:00 3. HOW: How did you take your blood pressure? (e.g., visiting nurse, automatic home BP monitor)     Home cuff  4. HISTORY: Do you have a history of low blood pressure? What is your blood pressure normally?     No-htn 5. MEDICINES: Are you taking any medicines for blood pressure? If  Yes, ask: Have they been changed recently?     As prescribed  6. PULSE RATE: Do you know what your pulse rate is?      100 7. OTHER SYMPTOMS: Have you been sick recently? Have you had a recent injury?     Currently asymptomatic.  Syncope on 11/14/23-[place] [facility].  Protocols used: Blood Pressure - Low-A-AH

## 2023-11-17 LAB — IMMUNOFIXATION ELECTROPHORESIS
IgA: 374 mg/dL (ref 61–437)
IgG (Immunoglobin G), Serum: 1638 mg/dL — ABNORMAL HIGH (ref 603–1613)
IgM (Immunoglobulin M), Srm: 100 mg/dL (ref 15–143)
Total Protein ELP: 7.2 g/dL (ref 6.0–8.5)

## 2023-11-18 ENCOUNTER — Ambulatory Visit: Payer: Self-pay

## 2023-11-20 ENCOUNTER — Inpatient Hospital Stay: Payer: PRIVATE HEALTH INSURANCE | Admitting: Oncology

## 2023-11-20 VITALS — BP 134/81 | HR 64 | Temp 97.7°F | Resp 16 | Wt 237.2 lb

## 2023-11-20 DIAGNOSIS — D508 Other iron deficiency anemias: Secondary | ICD-10-CM | POA: Diagnosis not present

## 2023-11-20 DIAGNOSIS — R778 Other specified abnormalities of plasma proteins: Secondary | ICD-10-CM | POA: Insufficient documentation

## 2023-11-20 DIAGNOSIS — R779 Abnormality of plasma protein, unspecified: Secondary | ICD-10-CM | POA: Diagnosis not present

## 2023-11-20 NOTE — Assessment & Plan Note (Addendum)
-  Most recent labs for 11/14/2023 shows a calcium level 9, stable baseline creatinine 2.94, hemoglobin 13.1 with normal differential. -SPEP did not reveal an M spike. -IFE showed polyclonal increase in 1 or more immunoglobulin. -Kappa lambda free light chains both elevated with normal light chain ratio. -Patient denies bone pain, B symptoms or neurological changes. -24-hour urine from 05/19/2023 did not reveal evidence of an M spike or M protein. -Patient has never had bone scan as it is not indicated. -If patient develops M spike, would recommend PET scan.

## 2023-11-20 NOTE — Assessment & Plan Note (Deleted)
-   Likely multifactorial with relative iron deficiency and CKD. -Patient is symptomatic with fatigue.  Hemoglobin is 11.9 (11.3).  - Nutritional workup was essentially unremarkable from 11/14/22. - He is taking iron supplements 1/day.  He is interested in continuing B12 injections. -Labs from -Discussed continuing B12 injections.  New prescription sent to pharmacy.  B12 level is 1100 which is slightly elevated.  B12 injections appear to give him a boost of his energy and we will continue for now.  If levels continue to rise, will switch to every other month injections.

## 2023-11-20 NOTE — Progress Notes (Signed)
 [facility] OFFICE PROGRESS NOTE  Antonetta Rollene BRAVO, MD  ASSESSMENT & PLAN:    Assessment & Plan Abnormal SPEP -Most recent labs for 11/14/2023 shows a calcium level 9, stable baseline creatinine 2.94, hemoglobin 13.1 with normal differential. -SPEP did not reveal an M spike. -IFE showed polyclonal increase in 1 or more immunoglobulin. -Kappa lambda free light chains both elevated with normal light chain ratio. -Patient denies bone pain, B symptoms or neurological changes. -24-hour urine from 05/19/2023 did not reveal evidence of an M spike or M protein. -Patient has never had bone scan as it is not indicated. -If patient develops M spike, would recommend PET scan. Other iron deficiency anemia - Likely multifactorial with relative iron deficiency and CKD. - He is currently taking iron supplements 1/day. -Labs from 11/14/2023 show hemoglobin of 13.1, MCV 82.5, iron saturation 28% with normal TIBC, ferritin 187. -Would recommend iron supplements every other day with vitamin C. -Vitamin B12 levels are elevated.  Most recently checked B12 shot last week. - Reduce B12 injections to every other month.  Recheck in 6 months.  Orders Placed This Encounter  Procedures   Immunofixation electrophoresis    Standing Status:   Future    Expected Date:   05/10/2024    Expiration Date:   11/19/2024   Methylmalonic acid, serum    Standing Status:   Future    Expected Date:   05/10/2024    Expiration Date:   11/19/2024   Kappa/lambda light chains    Standing Status:   Future    Expected Date:   05/10/2024    Expiration Date:   11/19/2024   Protein electrophoresis, serum    Standing Status:   Future    Expected Date:   05/10/2024    Expiration Date:   11/19/2024   Comprehensive metabolic panel    Standing Status:   Future    Expected Date:   05/10/2024    Expiration Date:   11/19/2024   CBC with Differential    Standing Status:   Future    Expected Date:   05/10/2024    Expiration  Date:   11/19/2024   Ferritin    Standing Status:   Future    Expected Date:   05/10/2024    Expiration Date:   11/19/2024   Iron and TIBC ([facility] ONLY)    Standing Status:   Future    Expected Date:   05/10/2024    Expiration Date:   11/19/2024   Vitamin B12    Standing Status:   Future    Expected Date:   05/10/2024    Expiration Date:   11/19/2024    INTERVAL HISTORY: Mr. Mccallum 73 y.o. male returns for routine follow-up of abnormal SPEP.    Patient was evaluated on 11/14/2023 for loss of consciousness.  Workup was essentially unremarkable and was thought to be due to orthostatic hypotension.  No further episodes.  He had recently been put on Jardiance  and thinks that may have contributed.  He has stopped that.  He also was taken off for blood pressure medicines.   He is followed by urology for elevated PSA, BPH, nocturia and erectile dysfunction.  Patient had insertion of inflatable penile prosthesis on 07/01/2023.   Reports overall he is doing well.  He denies any additional falls since his hospital stay.  All his blood pressure medicines and has follow-up with PCP on Monday.  Energy and appetite levels have improved since then.  Denies any bleeding, bright red blood per rectum, melena or hematochezia.   Reports he has follow-up with Dr. Waldemar at the end of the month.   Reports he gives himself B12 shots monthly.  His last shot was last week.  We reviewed IFE, MMA, kappa lambda light chains, protein electrophoresis, CMP, CBC, ferritin, iron panel and vitamin B12.  SUMMARY OF HEMATOLOGIC HISTORY: Oncology History Overview Note  - Referred by Dr. Rachele due to labs from 12/20/2020 and 04/12/2021 showing a poorly defined band of restricted protein and gammaglobulin regions on SPEP/IFE.  Urine IFE showed poorly defined area of restricted protein mobility detected and is reactive with kappa light chain antisera. - Hematology work-up (10/21/2021): SPEP and  immunofixation negative for monoclonal protein. Urine immunofixation unremarkable, no evidence of monoclonal protein. Elevated kappa light chains 42.1 with lambda light chains elevated at 35.4 and normal ratio 1.19.  This is in keeping with patient's CKD stage IIIb. Mildly elevated beta-2  microglobulin 2.6.  Normal LDH. No evidence of CRAB features on CBC/CMP.  (Baseline anemia with Hgb 11.7, baseline CKD stage IIIb with creatinine 1.88, calcium 8.9) - MGUS/myeloma panel (05/09/2022): Immunofixation negative. SPEP normal. Stable elevation in kappa light chain 58.4, elevated lambda light chain 46.3, normal kappa/lambda ratio 1.26.  This is in keeping with patient CKD stage IIIb. LDH normal. Creatinine 2.41, somewhat worsened from baseline.  Normal calcium 9.1.  Mild anemia with Hgb 11.7/MCV 82.9. 24-hour urine shows elevated light chains, but immunofixation pattern is unremarkable and M spike is negative.    No history exists.     CBC    Component Value Date/Time   WBC 6.5 11/14/2023 0340   RBC 4.40 11/14/2023 0340   HGB 12.3 (L) 11/14/2023 0340   HGB 11.7 (L) 08/06/2022 0841   HCT 36.3 (L) 11/14/2023 0340   HCT 35.8 (L) 08/06/2022 0841   PLT 232 11/14/2023 0340   PLT 214 08/06/2022 0841   MCV 82.5 11/14/2023 0340   MCV 83 08/06/2022 0841   MCH 28.0 11/14/2023 0340   MCHC 33.9 11/14/2023 0340   RDW 11.8 11/14/2023 0340   RDW 12.1 08/06/2022 0841   LYMPHSABS 2.2 11/14/2023 0340   LYMPHSABS 1.7 08/06/2022 0841   MONOABS 0.6 11/14/2023 0340   EOSABS 0.2 11/14/2023 0340   EOSABS 0.3 08/06/2022 0841   BASOSABS 0.0 11/14/2023 0340   BASOSABS 0.0 08/06/2022 0841       Latest Ref Rng & Units 11/14/2023    3:40 AM 11/13/2023   10:31 AM 09/28/2023    8:49 AM  CMP  Glucose 70 - 99 mg/dL 802  861  840   BUN 8 - 23 mg/dL 40  37  22   Creatinine 0.61 - 1.24 mg/dL 7.18  7.05  7.53   Sodium 135 - 145 mmol/L 140  142  142   Potassium 3.5 - 5.1 mmol/L 3.5  3.8  4.4   Chloride 98 - 111  mmol/L 105  103  104   CO2 22 - 32 mmol/L 24  26  24    Calcium 8.9 - 10.3 mg/dL 9.3  9.9  9.6   Total Protein 6.5 - 8.1 g/dL  7.9  7.0   Total Bilirubin 0.0 - 1.2 mg/dL  0.7  0.7   Alkaline Phos 38 - 126 U/L  100  108   AST 15 - 41 U/L  18  14   ALT 0 - 44 U/L  11  11      Lab Results  Component Value Date   FERRITIN 187 11/13/2023   VITAMINB12 >4,000 (H) 11/13/2023    Vitals:   11/20/23 1045  BP: 134/81  Pulse: 64  Resp: 16  Temp: 97.7 F (36.5 C)  SpO2: 100%    Review of System:  Review of Systems  Constitutional:  Positive for malaise/fatigue.  Musculoskeletal:  Positive for falls.  Neurological:  Positive for dizziness.    Physical Exam: Physical Exam Constitutional:      Appearance: Normal appearance.  HENT:     Head: Normocephalic and atraumatic.  Eyes:     Pupils: Pupils are equal, round, and reactive to light.  Cardiovascular:     Rate and Rhythm: Normal rate and regular rhythm.     Heart sounds: Normal heart sounds. No murmur heard. Pulmonary:     Effort: Pulmonary effort is normal.     Breath sounds: Normal breath sounds. No wheezing.  Abdominal:     General: Bowel sounds are normal. There is no distension.     Palpations: Abdomen is soft.     Tenderness: There is no abdominal tenderness.  Musculoskeletal:        General: Normal range of motion.     Cervical back: Normal range of motion.  Skin:    General: Skin is warm and dry.     Findings: No rash.  Neurological:     Mental Status: He is alert and oriented to person, place, and time.     Gait: Gait is intact.  Psychiatric:        Mood and Affect: Mood and affect normal.        Cognition and Memory: Memory normal.        Judgment: Judgment normal.      I spent 20 minutes dedicated to the care of this patient (face-to-face and non-face-to-face) on the date of the encounter to include what is described in the assessment and plan.,  Delon Hope, NP 11/20/2023 11:07 AM

## 2023-11-20 NOTE — Assessment & Plan Note (Addendum)
-   Likely multifactorial with relative iron deficiency and CKD. - He is currently taking iron supplements 1/day. -Labs from 11/14/2023 show hemoglobin of 13.1, MCV 82.5, iron saturation 28% with normal TIBC, ferritin 187. -Would recommend iron supplements every other day with vitamin C. -Vitamin B12 levels are elevated.  Most recently checked B12 shot last week. - Reduce B12 injections to every other month.  Recheck in 6 months.

## 2023-11-21 LAB — METHYLMALONIC ACID, SERUM: Methylmalonic Acid, Quantitative: 270 nmol/L (ref 0–378)

## 2023-11-23 ENCOUNTER — Encounter: Payer: Self-pay | Admitting: Oncology

## 2023-11-23 ENCOUNTER — Encounter: Payer: Self-pay | Admitting: Internal Medicine

## 2023-11-23 ENCOUNTER — Ambulatory Visit (INDEPENDENT_AMBULATORY_CARE_PROVIDER_SITE_OTHER): Admitting: Internal Medicine

## 2023-11-23 VITALS — BP 138/82 | HR 62 | Ht 70.0 in | Wt 239.0 lb

## 2023-11-23 DIAGNOSIS — Z09 Encounter for follow-up examination after completed treatment for conditions other than malignant neoplasm: Secondary | ICD-10-CM | POA: Insufficient documentation

## 2023-11-23 DIAGNOSIS — I10 Essential (primary) hypertension: Secondary | ICD-10-CM

## 2023-11-23 DIAGNOSIS — R55 Syncope and collapse: Secondary | ICD-10-CM | POA: Diagnosis not present

## 2023-11-23 NOTE — Assessment & Plan Note (Signed)
 Likely had orthostatic hypotension related syncope Advised to maintain adequate hydration and eat at regular intervals Avoid sudden positional changes Discontinued irbesartan  and chlorthalidone  for now

## 2023-11-23 NOTE — Assessment & Plan Note (Signed)
 BP Readings from Last 1 Encounters:  11/23/23 138/82   Well-controlled currently despite stopping amlodipine , irbesartan  and chlorthalidone  recently after ER visit Advised to stop irbesartan  and chlorthalidone  Advised to check BP once daily - if SBP more than 140 on 3 consecutive days, start amlodipine  5 mg QD and contact us  Counseled for compliance with the medications Advised DASH diet and moderate exercise/walking, at least 150 mins/week

## 2023-11-23 NOTE — Progress Notes (Signed)
 Established Patient Office Visit  Subjective:  Patient ID: Thomas Malone, male    DOB: 1950/05/16  Age: 73 y.o. MRN: [id number]  CC:  Chief Complaint  Patient presents with   Follow-up    ER follow up    HPI Thomas Malone is a 73 y.o. male with past medical history of HTN, type II DM, CKD, BPH and HLD who presents for f/u of recent ER visit for syncope.  From ER report: He was sleeping in the front room when he woke up feeling the urge to urinate. He stood up to go to the bathroom, he did have some mild incontinence. He went to change his clothes and went back to bed. Soon after he had the urge to urinate again and when he stood up, he had a brief syncopal episode and fell into the nightstand. No head injury. He is not on anticoagulation. No seizures reported. He woke up soon after. He has not had this before. He does report he has been having recent fatigue after recently starting Jardiance . He has stopped taking Jardiance  now.  He was given IV fluids in the ER for dehydration, suspected from orthostatic hypotension.  He has stopped taking all his antihypertensives as well as advised from ER. His BP is wnl today.  Past Medical History:  Diagnosis Date   Abnormal CT of the chest 01/23/2014   BPH (benign prostatic hyperplasia) 05/31/2018   Complication of anesthesia    hard to wake up    Diabetes mellitus without complication (HCC)    Elevated PSA    Erectile dysfunction    Gastroesophageal reflux    H/O sarcoidosis    Headache    7-8 months from gabapentin  and naproxen   Hypertension    Pneumonia of upper lobe of lung 12/1213   Sleep apnea     Past Surgical History:  Procedure Laterality Date   ANTERIOR CERVICAL DECOMP/DISCECTOMY FUSION N/A 05/28/2018   CATARACT EXTRACTION W/PHACO Left 04/11/2019   Procedure: CATARACT EXTRACTION PHACO AND INTRAOCULAR LENS PLACEMENT (IOC) (CDE:5.80);  Surgeon: Harrie Agent, MD;  Location: [facility];  Service: Ophthalmology;  Laterality:  Left;   CATARACT EXTRACTION W/PHACO Right 06/03/2019   Procedure: CATARACT EXTRACTION PHACO AND INTRAOCULAR LENS PLACEMENT RIGHT EYE;  Surgeon: Harrie Agent, MD;  Location: [facility];  Service: Ophthalmology;  Laterality: Right;  CDE: 9.82   COLONOSCOPY     COLONOSCOPY N/A 06/23/2014   Procedure: COLONOSCOPY;  Surgeon: Oneil Budge Md, MD;  Location: [facility];  Service: Gastroenterology;  Laterality: N/A;   ELBOW SURGERY Right    2021   EYE SURGERY Bilateral    lasic   ORTHOPEDIC SURGERY Left    heel surgery   PENILE PROSTHESIS IMPLANT N/A 07/01/2023   Procedure: INSERTION OF INFLATABLE PENILE PROSTHESIS;  Surgeon: Lovie Arlyss CROME, MD;  Location: [facility];  Service: Urology;  Laterality: N/A;  120 MINUTES NEEDED FOR CASE   PROSTATE BIOPSY     SHOULDER ARTHROSCOPY Right    repair of tendons and ligaments   TONSILLECTOMY     ULNAR NERVE TRANSPOSITION Right 08/28/2022   Procedure: RIGHT ULNAR NERVE DECOMPRESSION WITH TRANSPOSITION POSSIBLE SUBMUSCULAR TRANSPOSITION;  Surgeon: Murrell Drivers, MD;  Location: [facility];  Service: Orthopedics;  Laterality: Right;  120 MIN   WRIST SURGERY     2021    Family History  Problem Relation Age of Onset   Lymphoma Mother    Heart disease Father  Hyperlipidemia Sister    Diabetes Sister    Heart disease Sister    Breast cancer Sister    Hyperlipidemia Brother    Diabetes Brother     Social History   Socioeconomic History   Marital status: Married    Spouse name: Not on file   Number of children: 2   Years of education: Not on file   Highest education level: Not on file  Occupational History   Occupation: concrete finisher  Tobacco Use   Smoking status: Former    Types: Cigars   Smokeless tobacco: Never   Tobacco comments:    and pipe-none since 2010  Vaping Use   Vaping status: Never Used  Substance and Sexual Activity   Alcohol  use: No   Drug use: No   Sexual activity: Yes    Birth  control/protection: None  Other Topics Concern   Not on file  Social History Narrative   Not on file   Social Drivers of Health   Financial Resource Strain: Low Risk  (10/07/2022)   Overall Financial Resource Strain (CARDIA)    Difficulty of Paying Living Expenses: Not hard at all  Food Insecurity: No Food Insecurity (10/07/2022)   Hunger Vital Sign    Worried About Running Out of Food in the Last Year: Never true    Ran Out of Food in the Last Year: Never true  Transportation Needs: No Transportation Needs (10/07/2022)   PRAPARE - Administrator, Civil Service (Medical): No    Lack of Transportation (Non-Medical): No  Physical Activity: Sufficiently Active (10/07/2022)   Exercise Vital Sign    Days of Exercise per Week: 7 days    Minutes of Exercise per Session: 30 min  Stress: No Stress Concern Present (10/07/2022)   [employer] of Occupational Health - Occupational Stress Questionnaire    Feeling of Stress : Not at all  Social Connections: Moderately Integrated (10/07/2022)   Social Connection and Isolation Panel    Frequency of Communication with Friends and Family: More than three times a week    Frequency of Social Gatherings with Friends and Family: More than three times a week    Attends Religious Services: More than 4 times per year    Active Member of [employer] or Organizations: No    Attends Banker Meetings: Never    Marital Status: Married  Catering manager Violence: Not At Risk (10/07/2022)   Humiliation, Afraid, Rape, and Kick questionnaire    Fear of Current or Ex-Partner: No    Emotionally Abused: No    Physically Abused: No    Sexually Abused: No    Outpatient Medications Prior to Visit  Medication Sig Dispense Refill   alfuzosin  (UROXATRAL ) 10 MG 24 hr tablet Take 1 tablet (10 mg total) by mouth daily with breakfast. 90 tablet 3   amLODipine  (NORVASC ) 10 MG tablet Take 0.5 tablets (5 mg total) by mouth daily. (Patient not taking: Reported on  11/20/2023) 90 tablet 3   aspirin EC 81 MG tablet Take 81 mg by mouth daily. Swallow whole.     cyanocobalamin  (VITAMIN B12) 1000 MCG/ML injection Inject 1 mL (1,000 mcg total) into the muscle every 30 (thirty) days. 6 mL 0   DULoxetine  (CYMBALTA ) 30 MG capsule Take 1 capsule (30 mg total) by mouth daily. 30 capsule 3   finasteride  (PROSCAR ) 5 MG tablet Take 1 tablet (5 mg total) by mouth daily. 90 tablet 3   fluticasone (FLONASE) 50 MCG/ACT nasal  spray Place 2 sprays into both nostrils daily. 16 g 6   FREESTYLE TEST STRIPS test strip USE AS DIRECTED 100 each 0   glipiZIDE  (GLUCOTROL ) 10 MG tablet Take 1 tablet (10 mg total) by mouth 2 (two) times daily before a meal. 180 tablet 3   Glucosamine-Chondroitin (COSAMIN DS PO) Take 1 tablet by mouth daily.     Lancets (FREESTYLE) lancets USE 1 LANCET TO CHECK GLUCOSE TWICE DAILY 100 each 0   loratadine (CLARITIN) 10 MG tablet Take 1 tablet (10 mg total) by mouth daily. 30 tablet 11   Multiple Vitamin (MULTIVITAMIN WITH MINERALS) TABS tablet Take 1 tablet by mouth daily.     NEEDLE, DISP, 25 G (BD DISP NEEDLES) 25G X 5/8 MISC Patient to use with B 12 injection 12 each 0   omeprazole  (PRILOSEC) 40 MG capsule Take 1 capsule (40 mg total) by mouth every other day. 90 capsule 3   pravastatin  (PRAVACHOL ) 20 MG tablet Take 20 mg by mouth daily.     chlorthalidone  (HYGROTON ) 25 MG tablet Take 1 tablet (25 mg total) by mouth daily. (Patient not taking: Reported on 11/20/2023) 90 tablet 3   irbesartan  (AVAPRO ) 150 MG tablet Take 1 tablet (150 mg total) by mouth daily. 90 tablet 3   JARDIANCE  10 MG TABS tablet Take 10 mg by mouth daily.     No facility-administered medications prior to visit.    Allergies  Allergen Reactions   Diclofenac Anaphylaxis   Diclofenac Sodium Anaphylaxis    Topical gel   Levaquin [Levofloxacin In D5w] Anaphylaxis   Lidocaine  Anaphylaxis    ROS Review of Systems  Constitutional:  Negative for chills and fever.  HENT:   Negative for congestion and sore throat.   Eyes:  Negative for pain and discharge.  Respiratory:  Negative for cough and shortness of breath.   Cardiovascular:  Negative for chest pain and palpitations.  Gastrointestinal:  Negative for constipation, diarrhea, nausea and vomiting.  Endocrine: Negative for polydipsia and polyuria.  Genitourinary:  Negative for dysuria and hematuria.  Musculoskeletal:  Negative for neck pain and neck stiffness.  Skin:  Negative for rash.  Neurological:  Negative for weakness, numbness and headaches.  Psychiatric/Behavioral:  Negative for agitation and behavioral problems.       Objective:    Physical Exam Vitals reviewed.  Constitutional:      General: He is not in acute distress.    Appearance: He is not diaphoretic.  HENT:     Head: Normocephalic and atraumatic.     Nose: Nose normal.     Mouth/Throat:     Mouth: Mucous membranes are moist.  Eyes:     General: No scleral icterus.    Extraocular Movements: Extraocular movements intact.  Cardiovascular:     Rate and Rhythm: Normal rate and regular rhythm.     Heart sounds: Normal heart sounds. No murmur heard. Pulmonary:     Breath sounds: Normal breath sounds. No wheezing or rales.  Musculoskeletal:     Cervical back: Neck supple. No tenderness.     Right lower leg: No edema.     Left lower leg: No edema.  Skin:    General: Skin is warm.     Findings: No rash.  Neurological:     General: No focal deficit present.     Mental Status: He is alert and oriented to person, place, and time.     Sensory: No sensory deficit.     Motor: No weakness.  Psychiatric:        Mood and Affect: Mood normal.        Behavior: Behavior normal.     BP 138/82   Pulse 62   Ht 5' 10 (1.778 m)   Wt 239 lb (108.4 kg)   SpO2 100%   BMI 34.29 kg/m  Wt Readings from Last 3 Encounters:  11/23/23 239 lb (108.4 kg)  11/20/23 237 lb 3.4 oz (107.6 kg)  11/14/23 240 lb (108.9 kg)    Lab Results   Component Value Date   TSH 2.120 08/06/2022   Lab Results  Component Value Date   WBC 6.5 11/14/2023   HGB 12.3 (L) 11/14/2023   HCT 36.3 (L) 11/14/2023   MCV 82.5 11/14/2023   PLT 232 11/14/2023   Lab Results  Component Value Date   NA 140 11/14/2023   K 3.5 11/14/2023   CO2 24 11/14/2023   GLUCOSE 197 (H) 11/14/2023   BUN 40 (H) 11/14/2023   CREATININE 2.81 (H) 11/14/2023   BILITOT 0.7 11/13/2023   ALKPHOS 100 11/13/2023   AST 18 11/13/2023   ALT 11 11/13/2023   PROT 7.9 11/13/2023   ALBUMIN 4.3 11/13/2023   CALCIUM 9.3 11/14/2023   ANIONGAP 11 11/14/2023   EGFR 27 (L) 09/28/2023   Lab Results  Component Value Date   CHOL 145 09/28/2023   Lab Results  Component Value Date   HDL 40 09/28/2023   Lab Results  Component Value Date   LDLCALC 87 09/28/2023   Lab Results  Component Value Date   TRIG 97 09/28/2023   Lab Results  Component Value Date   CHOLHDL 3.6 09/28/2023   Lab Results  Component Value Date   HGBA1C 8.4 (H) 09/28/2023      Assessment & Plan:   Problem List Items Addressed This Visit       Cardiovascular and Mediastinum   Essential hypertension (Chronic)   BP Readings from Last 1 Encounters:  11/23/23 138/82   Well-controlled currently despite stopping amlodipine , irbesartan  and chlorthalidone  recently after ER visit Advised to stop irbesartan  and chlorthalidone  Advised to check BP once daily - if SBP more than 140 on 3 consecutive days, start amlodipine  5 mg QD and contact us  Counseled for compliance with the medications Advised DASH diet and moderate exercise/walking, at least 150 mins/week        Other   Syncope and collapse   Likely had orthostatic hypotension related syncope Advised to maintain adequate hydration and eat at regular intervals Avoid sudden positional changes Discontinued irbesartan  and chlorthalidone  for now      Encounter for examination following treatment at hospital - Primary   ER chart reviewed,  including blood tests Likely had vasovagal syncope in the setting of orthostatic hypotension BP WNL today, symptoms resolved now       No orders of the defined types were placed in this encounter.   Follow-up: Return if symptoms worsen or fail to improve.    Suzzane MARLA Blanch, MD

## 2023-11-23 NOTE — Patient Instructions (Signed)
 Please stop taking Chlorthalidone  and Irbesartan  for now.  Please check BP once daily regularly at home. If you notice blood pressure over 140/90 on 3 consecutive days, please start taking half tablet of Amlodipine .  Please maintain at least 64 ounces of fluid intake in a day.

## 2023-11-23 NOTE — Assessment & Plan Note (Signed)
 ER chart reviewed, including blood tests Likely had vasovagal syncope in the setting of orthostatic hypotension BP WNL today, symptoms resolved now

## 2023-12-01 ENCOUNTER — Encounter: Payer: Self-pay | Admitting: Internal Medicine

## 2023-12-08 ENCOUNTER — Ambulatory Visit

## 2024-01-05 ENCOUNTER — Ambulatory Visit

## 2024-01-05 VITALS — BP 130/88 | HR 70 | Ht 70.0 in | Wt 236.1 lb

## 2024-01-05 DIAGNOSIS — I10 Essential (primary) hypertension: Secondary | ICD-10-CM

## 2024-01-05 DIAGNOSIS — E782 Mixed hyperlipidemia: Secondary | ICD-10-CM

## 2024-01-05 MED ORDER — PRAVASTATIN SODIUM 20 MG PO TABS: 20.0000 mg | ORAL_TABLET | Freq: Every day | ORAL | 3 refills | Status: AC

## 2024-01-05 MED ORDER — IRBESARTAN 150 MG PO TABS: 150.0000 mg | ORAL_TABLET | Freq: Every day | ORAL | 1 refills | Status: AC

## 2024-01-05 NOTE — Assessment & Plan Note (Signed)
 Blood pressure remains elevated on amlodipine  10 mg and irbesartan  75 mg. Home readings up to 160 mmHg indicate current regimen may be insufficient. - Increased irbesartan  to 150 mg daily. - Instructed to monitor blood pressure at home and report lightheadedness. - Scheduled follow-up in three months.

## 2024-01-05 NOTE — Assessment & Plan Note (Signed)
 Managed with pravastatin  20 mg.  Refills provided.  Update lipid panel at [facility] in 3 months.

## 2024-01-05 NOTE — Progress Notes (Signed)
 Established Patient Office Visit  Subjective   Patient ID: Thomas Malone, male    DOB: 1950/08/12  Age: 73 y.o. MRN: [id number]  Chief Complaint  Patient presents with   Medical Management of Chronic Issues    3 month follow up, Blood Pressure been acting up the past couple months been running high     HPI Discussed the use of AI scribe software for clinical note transcription with the patient, who gave verbal consent to proceed.  History of Present Illness    Thomas Malone is a 73 year old male with hypertension who presents for follow-up of elevated blood pressure.  Hypertension - Persistent elevated blood pressure despite recent medication adjustments - Current antihypertensive regimen includes amlodipine  5 mg and irbesartan  75 mg, both taken twice daily (morning and evening) - Blood pressure measured at 160 mmHg this morning - Previously on a higher dose of irbesartan  (150 mg), which was discontinued - Uses a home blood pressure monitor with upper arm measurements, which he believes is more accurate than wrist monitors     Patient Active Problem List   Diagnosis Date Noted   Syncope and collapse 11/23/2023   Encounter for examination following treatment at hospital 11/23/2023   Abnormal SPEP 11/20/2023   Diabetic neuropathy (HCC) 10/30/2023   Bilateral hearing loss 10/02/2023   Need for influenza vaccination 12/22/2022   Anemia 11/14/2022   Abnormal auditory perception of right ear 11/07/2022   Type 2 diabetes mellitus with diabetic chronic kidney disease (HCC) 08/06/2022   HLD (hyperlipidemia) 08/06/2022   GERD (gastroesophageal reflux disease) 08/06/2022   Fatigue 08/06/2022   Bilateral lower extremity edema 08/06/2022   OSA on CPAP 02/10/2022   Allergic rhinitis 05/09/2021   DOE (dyspnea on exertion) 03/28/2021   Elevated PSA 06/22/2019   Dysphagia 05/31/2018   Chronic kidney disease, stage 4 (severe) (HCC) 05/31/2018   BPH (benign prostatic hyperplasia)  05/31/2018   Postoperative seroma involving digestive system after non-digestive system procedure 05/30/2018   H/O sarcoidosis    Essential hypertension    Erectile dysfunction     ROS    Objective:     BP 130/88 (BP Location: Left Arm, Patient Position: Sitting, Cuff Size: Normal)   Pulse 70   Ht 5' 10 (1.778 m)   Wt 236 lb 1.3 oz (107.1 kg)   SpO2 94%   BMI 33.87 kg/m  BP Readings from Last 3 Encounters:  01/05/24 130/88  11/23/23 138/82  11/20/23 134/81   Wt Readings from Last 3 Encounters:  01/05/24 236 lb 1.3 oz (107.1 kg)  11/23/23 239 lb (108.4 kg)  11/20/23 237 lb 3.4 oz (107.6 kg)     Physical Exam Vitals and nursing note reviewed.  Constitutional:      Appearance: Normal appearance.  HENT:     Head: Normocephalic.  Eyes:     Extraocular Movements: Extraocular movements intact.     Pupils: Pupils are equal, round, and reactive to light.  Cardiovascular:     Rate and Rhythm: Normal rate and regular rhythm.  Pulmonary:     Effort: Pulmonary effort is normal.     Breath sounds: Normal breath sounds.  Musculoskeletal:     Cervical back: Normal range of motion and neck supple.  Neurological:     Mental Status: He is alert and oriented to person, place, and time.  Psychiatric:        Mood and Affect: Mood normal.        Thought Content: Thought  content normal.      No results found for any visits on 01/05/24.  Last CBC Lab Results  Component Value Date   WBC 6.5 11/14/2023   HGB 12.3 (L) 11/14/2023   HCT 36.3 (L) 11/14/2023   MCV 82.5 11/14/2023   MCH 28.0 11/14/2023   RDW 11.8 11/14/2023   PLT 232 11/14/2023   Last metabolic panel Lab Results  Component Value Date   GLUCOSE 197 (H) 11/14/2023   NA 140 11/14/2023   K 3.5 11/14/2023   CL 105 11/14/2023   CO2 24 11/14/2023   BUN 40 (H) 11/14/2023   CREATININE 2.81 (H) 11/14/2023   GFRNONAA 23 (L) 11/14/2023   CALCIUM 9.3 11/14/2023   PROT 7.9 11/13/2023   ALBUMIN 4.3 11/13/2023    LABGLOB 3.6 11/13/2023   AGRATIO 1.0 11/13/2023   BILITOT 0.7 11/13/2023   ALKPHOS 100 11/13/2023   AST 18 11/13/2023   ALT 11 11/13/2023   ANIONGAP 11 11/14/2023   Last lipids Lab Results  Component Value Date   CHOL 145 09/28/2023   HDL 40 09/28/2023   LDLCALC 87 09/28/2023   TRIG 97 09/28/2023   CHOLHDL 3.6 09/28/2023   Last hemoglobin A1c Lab Results  Component Value Date   HGBA1C 8.4 (H) 09/28/2023   Last thyroid  functions Lab Results  Component Value Date   TSH 2.120 08/06/2022   FREET4 1.26 08/06/2022      The 10-year ASCVD risk score (Arnett DK, et al., 2019) is: 35%    Assessment & Plan:   Problem List Items Addressed This Visit       Cardiovascular and Mediastinum   Essential hypertension - Primary (Chronic)   Blood pressure remains elevated on amlodipine  10 mg and irbesartan  75 mg. Home readings up to 160 mmHg indicate current regimen may be insufficient. - Increased irbesartan  to 150 mg daily. - Instructed to monitor blood pressure at home and report lightheadedness. - Scheduled follow-up in three months.      Relevant Medications   pravastatin  (PRAVACHOL ) 20 MG tablet   irbesartan  (AVAPRO ) 150 MG tablet     Other   HLD (hyperlipidemia) (Chronic)   Managed with pravastatin  20 mg.  Refills provided.  Update lipid panel at [facility] in 3 months.       Relevant Medications   pravastatin  (PRAVACHOL ) 20 MG tablet   irbesartan  (AVAPRO ) 150 MG tablet    Return in about 3 months (around 04/04/2024) for chronic follow-up with PCP.    Leita Longs, FNP

## 2024-01-12 ENCOUNTER — Ambulatory Visit

## 2024-02-10 ENCOUNTER — Ambulatory Visit (INDEPENDENT_AMBULATORY_CARE_PROVIDER_SITE_OTHER): Admitting: Physician Assistant

## 2024-02-10 ENCOUNTER — Encounter (INDEPENDENT_AMBULATORY_CARE_PROVIDER_SITE_OTHER): Payer: Self-pay | Admitting: Physician Assistant

## 2024-02-10 VITALS — BP 150/94 | HR 65 | Temp 98.4°F

## 2024-02-10 DIAGNOSIS — H906 Mixed conductive and sensorineural hearing loss, bilateral: Secondary | ICD-10-CM

## 2024-02-10 NOTE — Progress Notes (Signed)
 Patient took BP medication this morning at 7. Patient explains that his doctor is working on getting his BP lower.

## 2024-02-10 NOTE — Progress Notes (Signed)
 Dear Dr. Antonetta, Here is my assessment for our mutual patient, Thomas Malone. Thank you for allowing me the opportunity to care for your patient. Please do not hesitate to contact me should you have any other questions. Sincerely, Chyrl Cohen PA-C  Otolaryngology Clinic Note Referring provider: Dr. Antonetta HPI:  Thomas Malone is a 74 y.o. male kindly referred by Dr. Antonetta   Discussed the use of AI scribe software for clinical note transcription with the patient, who gave verbal consent to proceed.  History of Present Illness   Thomas Malone is a 74 year old male with bilateral mixed hearing loss who presents for follow-up of hearing loss.  He was last seen in the office on 11/06/2023.  Below is a recap of encounter.  The patient is a 74 year old gentleman seen in our office for evaluation of decreased hearing.  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  He feels like he has had a slow progressive loss of hearing bilateral.  He notes that several years ago he was injured and was placed on gabapentin  and another unknown medication.  He felt like his hearing got worse after taking his medications as well.  He notes the last month he felt like both of his ears were full, clogged, decreased hearing.  He notes that he was started on nasal spray and Valsalva, this caused his ears to pop he notes his hearing returned to normal on the left, he notes some persistent decreased hearing on the right when compared to left.  He denies associated pain, no drainage, no preceding infection.  He notes some baseline generalized dizziness, no vertigo but attributes this to elevated blood sugars and medications.  He denies any throat pain, changes to voice.  He notes bilateral tunnel tinnitus that is worse in quiet environments.  He reports a history of seasonal allergies and uses Flonase  occasionally, does not feel congested today.    Update 02/10/2024    He initially experienced  profound bilateral hearing loss prior to his last visit in October, describing a sensation of being underwater and requiring increased television volume. Since then, his hearing has improved and is now at a manageable level, though not fully restored. He denies otalgia and reports decreased aural fullness.  He continues to experience constant bilateral tinnitus, described as ringing or buzzing, which is more noticeable in quiet environments and less bothersome when outside or driving. He expresses a desire for improvement in the tinnitus.  He continues daily use of intranasal corticosteroids and an oral medication, which he feels have improved his ear symptoms. He denies current nasal obstruction. It is unclear if he is currently irrigating his sinuses.           Independent Review of Additional Tests or Records:  none   PMH/Meds/All/SocHx/FamHx/ROS:   Past Medical History:  Diagnosis Date   Abnormal CT of the chest 01/23/2014   BPH (benign prostatic hyperplasia) 05/31/2018   Complication of anesthesia    hard to wake up    Diabetes mellitus without complication (HCC)    Elevated PSA    Erectile dysfunction    Gastroesophageal reflux    H/O sarcoidosis    Headache    7-8 months from gabapentin  and naproxen   Hypertension    Pneumonia of upper lobe of lung 12/1213   Sleep apnea      Past Surgical History:  Procedure Laterality Date   ANTERIOR CERVICAL DECOMP/DISCECTOMY FUSION N/A 05/28/2018   CATARACT  EXTRACTION W/PHACO Left 04/11/2019   Procedure: CATARACT EXTRACTION PHACO AND INTRAOCULAR LENS PLACEMENT (IOC) (CDE:5.80);  Surgeon: Harrie Agent, MD;  Location: [facility];  Service: Ophthalmology;  Laterality: Left;   CATARACT EXTRACTION W/PHACO Right 06/03/2019   Procedure: CATARACT EXTRACTION PHACO AND INTRAOCULAR LENS PLACEMENT RIGHT EYE;  Surgeon: Harrie Agent, MD;  Location: [facility];  Service: Ophthalmology;  Laterality: Right;  CDE: 9.82   COLONOSCOPY     COLONOSCOPY N/A  06/23/2014   Procedure: COLONOSCOPY;  Surgeon: Oneil Budge Md, MD;  [facility]: [facility];  Service: Gastroenterology;  Laterality: N/A;   ELBOW SURGERY Right    2021   EYE SURGERY Bilateral    lasic   ORTHOPEDIC SURGERY Left    heel surgery   PENILE PROSTHESIS IMPLANT N/A 07/01/2023   Procedure: INSERTION OF INFLATABLE PENILE PROSTHESIS;  Surgeon: Lovie Arlyss CROME, MD;  Location: [facility];  Service: Urology;  Laterality: N/A;  120 MINUTES NEEDED FOR CASE   PROSTATE BIOPSY     SHOULDER ARTHROSCOPY Right    repair of tendons and ligaments   TONSILLECTOMY     ULNAR NERVE TRANSPOSITION Right 08/28/2022   Procedure: RIGHT ULNAR NERVE DECOMPRESSION WITH TRANSPOSITION POSSIBLE SUBMUSCULAR TRANSPOSITION;  Surgeon: Murrell Drivers, MD;  Location: [facility];  Service: Orthopedics;  Laterality: Right;  120 MIN   WRIST SURGERY     2021    Family History  Problem Relation Age of Onset   Lymphoma Mother    Heart disease Father    Hyperlipidemia Sister    Diabetes Sister    Heart disease Sister    Breast cancer Sister    Hyperlipidemia Brother    Diabetes Brother      Social Connections: Moderately Integrated (10/07/2022)   Social Connection and Isolation Panel    Frequency of Communication with Friends and Family: More than three times a week    Frequency of Social Gatherings with Friends and Family: More than three times a week    Attends Religious Services: More than 4 times per year    Active Member of [employer] or Organizations: No    Attends Engineer, Structural: Never    Marital Status: Married     Current Medications[1]   Physical Exam:   BP (!) 150/94   Pulse 65   Temp 98.4 F (36.9 C)   SpO2 94%   Pertinent Findings  CN II-XII grossly intact Bilateral EAC clear and TM intact with well pneumatized middle ear spaces Anterior rhinoscopy: Septum midline; bilateral inferior turbinates with moderate hypertrophy No lesions of oral  cavity/oropharynx; dentition WNl No obviously palpable neck masses/lymphadenopathy/thyromegaly No respiratory distress or stridor   Seprately Identifiable Procedures:  None  Impression & Plans:  Thomas Malone is a 74 y.o. male with the following   Assessment and Plan    Mixed conductive and sensorineural hearing loss, bilateral- -symptomatically the patient is feeling better.  His previous audio did show mixed hearing loss.  I would like repeat audiological evaluation.  Once these results are available I will discuss them on the phone with the patient.  Based on previous audiogram he would likely benefit from hearing aids.  Additionally he still has turbinate hypertrophy, advised him to continue using Nasonex and saline irrigation.  The patient verbalized understanding and agreement to today's plan.        - f/u phone call discussion with audiological evaluation   Thank you for allowing me the opportunity to care for your patient.  Please do not hesitate to contact me should you have any other questions.  Sincerely, Chyrl Cohen PA-C [facility] Phone: [phone] Fax: [phone]  02/10/2024, 2:46 PM        [1]  Current Outpatient Medications:    alfuzosin  (UROXATRAL ) 10 MG 24 hr tablet, Take 1 tablet (10 mg total) by mouth daily with breakfast., Disp: 90 tablet, Rfl: 3   aspirin EC 81 MG tablet, Take 81 mg by mouth daily. Swallow whole., Disp: , Rfl:    cyanocobalamin  (VITAMIN B12) 1000 MCG/ML injection, Inject 1 mL (1,000 mcg total) into the muscle every 30 (thirty) days., Disp: 6 mL, Rfl: 0   DULoxetine  (CYMBALTA ) 30 MG capsule, Take 1 capsule (30 mg total) by mouth daily., Disp: 30 capsule, Rfl: 3   finasteride  (PROSCAR ) 5 MG tablet, Take 1 tablet (5 mg total) by mouth daily., Disp: 90 tablet, Rfl: 3   fluticasone  (FLONASE ) 50 MCG/ACT nasal spray, Place 2 sprays into both nostrils daily., Disp: 16 g, Rfl: 6   FREESTYLE TEST STRIPS test strip, USE AS  DIRECTED, Disp: 100 each, Rfl: 0   glipiZIDE  (GLUCOTROL ) 10 MG tablet, Take 1 tablet (10 mg total) by mouth 2 (two) times daily before a meal., Disp: 180 tablet, Rfl: 3   Glucosamine-Chondroitin (COSAMIN DS PO), Take 1 tablet by mouth daily., Disp: , Rfl:    irbesartan  (AVAPRO ) 150 MG tablet, Take 1 tablet (150 mg total) by mouth daily., Disp: 90 tablet, Rfl: 1   Lancets (FREESTYLE) lancets, USE 1 LANCET TO CHECK GLUCOSE TWICE DAILY, Disp: 100 each, Rfl: 0   loratadine  (CLARITIN ) 10 MG tablet, Take 1 tablet (10 mg total) by mouth daily., Disp: 30 tablet, Rfl: 11   Multiple Vitamin (MULTIVITAMIN WITH MINERALS) TABS tablet, Take 1 tablet by mouth daily., Disp: , Rfl:    NEEDLE, DISP, 25 G (BD DISP NEEDLES) 25G X 5/8 MISC, Patient to use with B 12 injection, Disp: 12 each, Rfl: 0   omeprazole  (PRILOSEC) 40 MG capsule, Take 1 capsule (40 mg total) by mouth every other day., Disp: 90 capsule, Rfl: 3   pravastatin  (PRAVACHOL ) 20 MG tablet, Take 1 tablet (20 mg total) by mouth daily., Disp: 90 tablet, Rfl: 3   amLODipine  (NORVASC ) 10 MG tablet, Take 0.5 tablets (5 mg total) by mouth daily. (Patient not taking: Reported on 02/10/2024), Disp: 90 tablet, Rfl: 3

## 2024-02-17 ENCOUNTER — Other Ambulatory Visit: Payer: Self-pay | Admitting: Family Medicine

## 2024-02-19 ENCOUNTER — Telehealth: Payer: Self-pay | Admitting: Pharmacy Technician

## 2024-02-19 ENCOUNTER — Other Ambulatory Visit: Payer: Self-pay

## 2024-02-19 ENCOUNTER — Other Ambulatory Visit (HOSPITAL_COMMUNITY): Payer: Self-pay

## 2024-02-19 MED ORDER — LANCET DEVICE MISC: 1.0000 | 0 refills | Status: AC

## 2024-02-19 MED ORDER — LANCETS MISC: 1.0000 | 2 refills | Status: AC

## 2024-02-19 MED ORDER — BLOOD GLUCOSE TEST VI STRP: 1.0000 | ORAL_STRIP | 2 refills | Status: AC

## 2024-02-19 MED ORDER — BLOOD GLUCOSE MONITORING SUPPL DEVI: 1.0000 | 0 refills | Status: AC

## 2024-02-19 NOTE — Telephone Encounter (Signed)
 Diabetic testing supplies for accu check guide sent to [facility]

## 2024-02-19 NOTE — Telephone Encounter (Signed)
 Pharmacy Patient Advocate Encounter   Received notification from [employer] CMM KEY that prior authorization for Freestyle lite test strips and freestyle lancets is required/requested.   Insurance verification completed.   The patient is insured through [facility] ADVANTAGE/RX ADVANCE.   Per test claim:  Accu-chek guide or Contour is preferred by the insurance.  If suggested medication is appropriate, Please send in a new RX and discontinue this one. If not, please advise as to why it's not appropriate so that we may request a Prior Authorization. Please note, some preferred medications may still require a PA.  If the suggested medications have not been trialed and there are no contraindications to their use, the PA will not be submitted, as it will not be approved. Archived Key: [id number]  This is a 2026 formulary change set by [facility] Advantage.

## 2024-02-26 ENCOUNTER — Other Ambulatory Visit

## 2024-02-26 DIAGNOSIS — R972 Elevated prostate specific antigen [PSA]: Secondary | ICD-10-CM

## 2024-02-27 LAB — PSA, TOTAL AND FREE
PSA, Free Pct: 18 %
PSA, Free: 1.55 ng/mL
Prostate Specific Ag, Serum: 8.6 ng/mL — ABNORMAL HIGH (ref 0.0–4.0)

## 2024-02-28 ENCOUNTER — Other Ambulatory Visit: Payer: Self-pay | Admitting: Family Medicine

## 2024-02-28 ENCOUNTER — Other Ambulatory Visit: Payer: Self-pay | Admitting: Internal Medicine

## 2024-02-28 DIAGNOSIS — E1142 Type 2 diabetes mellitus with diabetic polyneuropathy: Secondary | ICD-10-CM

## 2024-02-28 DIAGNOSIS — K219 Gastro-esophageal reflux disease without esophagitis: Secondary | ICD-10-CM

## 2024-03-02 ENCOUNTER — Ambulatory Visit: Admitting: Urology

## 2024-03-02 VITALS — BP 138/87 | HR 72

## 2024-03-02 DIAGNOSIS — N401 Enlarged prostate with lower urinary tract symptoms: Secondary | ICD-10-CM | POA: Diagnosis not present

## 2024-03-02 DIAGNOSIS — R3912 Poor urinary stream: Secondary | ICD-10-CM | POA: Diagnosis not present

## 2024-03-02 DIAGNOSIS — R972 Elevated prostate specific antigen [PSA]: Secondary | ICD-10-CM

## 2024-03-02 DIAGNOSIS — N138 Other obstructive and reflux uropathy: Secondary | ICD-10-CM

## 2024-03-02 LAB — URINALYSIS, ROUTINE W REFLEX MICROSCOPIC
Bilirubin, UA: NEGATIVE
Ketones, UA: NEGATIVE
Leukocytes,UA: NEGATIVE
Nitrite, UA: NEGATIVE
RBC, UA: NEGATIVE
Specific Gravity, UA: 1.01 (ref 1.005–1.030)
Urobilinogen, Ur: 0.2 mg/dL (ref 0.2–1.0)
pH, UA: 6 (ref 5.0–7.5)

## 2024-03-02 LAB — MICROSCOPIC EXAMINATION: Bacteria, UA: NONE SEEN

## 2024-03-02 MED ORDER — FINASTERIDE 5 MG PO TABS: 5.0000 mg | ORAL_TABLET | Freq: Every day | ORAL | 3 refills | Status: AC

## 2024-03-02 MED ORDER — ALFUZOSIN HCL ER 10 MG PO TB24: 10.0000 mg | ORAL_TABLET | Freq: Two times a day (BID) | ORAL | 3 refills | Status: AC

## 2024-03-02 NOTE — Progress Notes (Unsigned)
 "  03/02/2024 8:54 AM   Thomas Malone October 19, 1950 [id number]  Referring provider: Melvenia Manus BRAVO, MD [address] [place],  [place] [address]  Followup elevated PSA and BPH   HPI: Thomas Malone is a 73yo here for followup for elevated PSA and BPh with weak urinary stream. PSa decreased to 8.6 from 10.5. IPSS 6 QOL 1 on uroxatral  10mg  and finasteride  5mg . He has an intermittent weak stream especially in the mornings.    PMH: Past Medical History:  Diagnosis Date   Abnormal CT of the chest 01/23/2014   BPH (benign prostatic hyperplasia) 05/31/2018   Complication of anesthesia    hard to wake up    Diabetes mellitus without complication (HCC)    Elevated PSA    Erectile dysfunction    Gastroesophageal reflux    H/O sarcoidosis    Headache    7-8 months from gabapentin  and naproxen   Hypertension    Pneumonia of upper lobe of lung 12/1213   Sleep apnea     Surgical History: Past Surgical History:  Procedure Laterality Date   ANTERIOR CERVICAL DECOMP/DISCECTOMY FUSION N/A 05/28/2018   CATARACT EXTRACTION W/PHACO Left 04/11/2019   Procedure: CATARACT EXTRACTION PHACO AND INTRAOCULAR LENS PLACEMENT (IOC) (CDE:5.80);  Surgeon: Harrie Agent, MD;  Location: [facility];  Service: Ophthalmology;  Laterality: Left;   CATARACT EXTRACTION W/PHACO Right 06/03/2019   Procedure: CATARACT EXTRACTION PHACO AND INTRAOCULAR LENS PLACEMENT RIGHT EYE;  Surgeon: Harrie Agent, MD;  Location: [facility];  Service: Ophthalmology;  Laterality: Right;  CDE: 9.82   COLONOSCOPY     COLONOSCOPY N/A 06/23/2014   Procedure: COLONOSCOPY;  Surgeon: Oneil Budge Md, MD;  [facility];  Service: Gastroenterology;  Laterality: N/A;   ELBOW SURGERY Right    2021   EYE SURGERY Bilateral    lasic   ORTHOPEDIC SURGERY Left    heel surgery   PENILE PROSTHESIS IMPLANT N/A 07/01/2023   Procedure: INSERTION OF INFLATABLE PENILE PROSTHESIS;  Surgeon: Lovie Arlyss CROME, MD;  Location: [facility];  Service: Urology;  Laterality: N/A;  120 MINUTES NEEDED FOR CASE   PROSTATE BIOPSY     SHOULDER ARTHROSCOPY Right    repair of tendons and ligaments   TONSILLECTOMY     ULNAR NERVE TRANSPOSITION Right 08/28/2022   Procedure: RIGHT ULNAR NERVE DECOMPRESSION WITH TRANSPOSITION POSSIBLE SUBMUSCULAR TRANSPOSITION;  Surgeon: Murrell Drivers, MD;  Location: [facility];  Service: Orthopedics;  Laterality: Right;  120 MIN   WRIST SURGERY     2021    Home Medications:  Allergies as of 03/02/2024       Reactions   Diclofenac Anaphylaxis   Diclofenac Sodium Anaphylaxis   Topical gel   Levaquin [levofloxacin In D5w] Anaphylaxis   Lidocaine  Anaphylaxis        Medication List        Accurate as of March 02, 2024  8:54 AM. If you have any questions, ask your nurse or doctor.          alfuzosin  10 MG 24 hr tablet Commonly known as: UROXATRAL  Take 1 tablet (10 mg total) by mouth daily with breakfast.   amLODipine  10 MG tablet Commonly known as: NORVASC  Take 0.5 tablets (5 mg total) by mouth daily.   aspirin EC 81 MG tablet Take 81 mg by mouth daily. Swallow whole.   BD Disp Needles 25G X 5/8 Misc Generic drug: NEEDLE (DISP) 25 G Patient to use with B 12  injection   Blood Glucose Monitoring Suppl Devi 1 each by Does not apply route as directed. Substitute to Accu Chek Guide per patient's insurance. Use as directed for twice daily testing (FOR ICD-10 E10.9, E11.9).   COSAMIN DS PO Take 1 tablet by mouth daily.   cyanocobalamin  1000 MCG/ML injection Commonly known as: VITAMIN B12 Inject 1 mL (1,000 mcg total) into the muscle every 30 (thirty) days.   DULoxetine  30 MG capsule Commonly known as: CYMBALTA  Take 1 capsule by mouth once daily   finasteride  5 MG tablet Commonly known as: PROSCAR  Take 1 tablet (5 mg total) by mouth daily.   fluticasone  50 MCG/ACT nasal spray Commonly known as: FLONASE  Place 2 sprays into both nostrils daily.    freestyle lancets USE 1 LANCET TO CHECK GLUCOSE TWICE DAILY   Lancets Misc 1 each by Does not apply route as directed. Substitute to Accu Chek Guide per patient's insurance. Use as directed for twice daily testing (FOR ICD-10 E10.9, E11.9).   FREESTYLE TEST STRIPS test strip Generic drug: glucose blood USE AS DIRECTED TO  CHECK  TWICE  DAILY   BLOOD GLUCOSE TEST STRIPS Strp 1 each by Does not apply route as directed. Substitute to Accu Chek Guide per patient's insurance. Use as directed for twice daily testing (FOR ICD-10 E10.9, E11.9).   glipiZIDE  10 MG tablet Commonly known as: GLUCOTROL  Take 1 tablet (10 mg total) by mouth 2 (two) times daily before a meal.   irbesartan  150 MG tablet Commonly known as: AVAPRO  Take 1 tablet (150 mg total) by mouth daily.   Lancet Device Misc 1 each by Does not apply route as directed. Substitute to Accu Chek Guide per patient's insurance. Use as directed for twice daily testing (FOR ICD-10 E10.9, E11.9).   loratadine  10 MG tablet Commonly known as: CLARITIN  Take 1 tablet (10 mg total) by mouth daily.   multivitamin with minerals Tabs tablet Take 1 tablet by mouth daily.   omeprazole  40 MG capsule Commonly known as: PRILOSEC TAKE 1 CAPSULE BY MOUTH EVERY OTHER DAY   pravastatin  20 MG tablet Commonly known as: PRAVACHOL  Take 1 tablet (20 mg total) by mouth daily.        Allergies: Allergies[1]  Family History: Family History  Problem Relation Age of Onset   Lymphoma Mother    Heart disease Father    Hyperlipidemia Sister    Diabetes Sister    Heart disease Sister    Breast cancer Sister    Hyperlipidemia Brother    Diabetes Brother     Social History:  reports that he has quit smoking. His smoking use included cigars. He has never used smokeless tobacco. He reports that he does not drink alcohol  and does not use drugs.  ROS: All other review of systems were reviewed and are negative except what is noted above in  HPI  Physical Exam: BP 138/87   Pulse 72   Constitutional:  Alert and oriented, No acute distress. HEENT: [place] AT, moist mucus membranes.  Trachea midline, no masses. Cardiovascular: No clubbing, cyanosis, or edema. Respiratory: Normal respiratory effort, no increased work of breathing. GI: Abdomen is soft, nontender, nondistended, no abdominal masses GU: No CVA tenderness.  Lymph: No cervical or inguinal lymphadenopathy. Skin: No rashes, bruises or suspicious lesions. Neurologic: Grossly intact, no focal deficits, moving all 4 extremities. Psychiatric: Normal mood and affect.  Laboratory Data: Lab Results  Component Value Date   WBC 6.5 11/14/2023   HGB 12.3 (L) 11/14/2023  HCT 36.3 (L) 11/14/2023   MCV 82.5 11/14/2023   PLT 232 11/14/2023    Lab Results  Component Value Date   CREATININE 2.81 (H) 11/14/2023    Lab Results  Component Value Date   PSA 6.7 (H) 06/22/2019    No results found for: TESTOSTERONE   Lab Results  Component Value Date   HGBA1C 8.4 (H) 09/28/2023    Urinalysis    Component Value Date/Time   COLORURINE STRAW (A) 11/14/2023 0509   APPEARANCEUR CLEAR 11/14/2023 0509   APPEARANCEUR Clear 08/28/2023 1233   LABSPEC 1.011 11/14/2023 0509   PHURINE 5.0 11/14/2023 0509   GLUCOSEU >=500 (A) 11/14/2023 0509   HGBUR NEGATIVE 11/14/2023 0509   BILIRUBINUR NEGATIVE 11/14/2023 0509   BILIRUBINUR Negative 08/28/2023 1233   KETONESUR NEGATIVE 11/14/2023 0509   PROTEINUR NEGATIVE 11/14/2023 0509   UROBILINOGEN 0.2 06/22/2019 1104   NITRITE NEGATIVE 11/14/2023 0509   LEUKOCYTESUR NEGATIVE 11/14/2023 0509    Lab Results  Component Value Date   LABMICR 5.8 09/28/2023   BACTERIA NONE SEEN 11/14/2023    Pertinent Imaging: *** No results found for this or any previous visit.  No results found for this or any previous visit.  No results found for this or any previous visit.  No results found for this or any previous visit.  Results for  orders placed during the hospital encounter of 12/24/20  US  RENAL  Narrative CLINICAL DATA:  Chronic kidney disease.  EXAM: RENAL / URINARY TRACT ULTRASOUND COMPLETE  COMPARISON:  None.  FINDINGS: Right Kidney:  Renal measurements: 11.5 x 6.1 x 5.2 cm = volume: 188 mL. There is diffuse increased renal parenchymal echogenicity. No hydronephrosis or shadowing stone. There is a 5 cm upper pole cyst.  Left Kidney:  Renal measurements: 11.9 x 6.6 x 5.4 cm = volume: 222 mL. Normal echogenicity. No hydronephrosis or shadowing stone.  Bladder:  Appears normal for degree of bladder distention.  Other:  None.  IMPRESSION: 1. Echogenic right kidney a 5 cm upper pole cyst. No hydronephrosis or shadowing stone. 2. Unremarkable left kidney and urinary bladder.   Electronically Signed By: Vanetta Chou M.D. On: 12/24/2020 23:22  No results found for this or any previous visit.  No results found for this or any previous visit.  No results found for this or any previous visit.   Assessment & Plan:    1. Elevated PSA (Primary) *** - Urinalysis, Routine w reflex microscopic  2. Benign prostatic hyperplasia with urinary obstruction Increase uroxatral  to 10mg  BID and continue finasterdie  3. Weak urinary stream ***   No follow-ups on file.  Belvie Clara, MD  [facility] [place]      [1]  Allergies Allergen Reactions   Diclofenac Anaphylaxis   Diclofenac Sodium Anaphylaxis    Topical gel   Levaquin [Levofloxacin In D5w] Anaphylaxis   Lidocaine  Anaphylaxis   "

## 2024-03-03 ENCOUNTER — Encounter: Payer: Self-pay | Admitting: Urology

## 2024-03-03 NOTE — Patient Instructions (Signed)
 PSA (Prostate-Specific Antigen): Blood Test Why am I having this test? The prostate-specific antigen (PSA) test is a screening test for prostate cancer. It can identify early signs of prostate cancer, which may allow for early detection and more effective treatment. Your health care provider may recommend that you have a PSA test starting at age 73 or that you have one earlier if you are at higher risk for prostate cancer. You may also have a PSA test: To monitor treatment of prostate cancer. To check whether prostate cancer has returned after treatment. What is being tested? This test measures the amount of PSA in your blood. PSA is a protein that is made in the prostate. The prostate naturally produces more PSA as you age, but very high levels may be a sign of a medical condition. What kind of sample is taken?  A blood sample is required for this test. It is usually collected by inserting a needle into a blood vessel but can also be collected by sticking a finger with a small needle. Blood for this test should be drawn before having an exam of the prostate that involves digital rectal examination to avoid affecting the results. How do I prepare for this test? Do not ejaculate starting 24 hours before your test, or as long as told by your health care provider, as this can cause an elevation in PSA. Do not undergo any procedures that require manipulation of the prostate, such as biopsy or surgery, for 6 weeks before the test is done as this can cause an elevation in PSA. Tell a health care provider about: Any signs you may have of other conditions that can affect PSA levels, such as: An enlarged prostate that is not caused by cancer (benign prostatic hyperplasia, or BPH). This condition is very common in older men. A prostate or urinary tract infection. Any allergies you have. All medicines you are taking, including vitamins, herbs, eye drops, creams, and over-the-counter medicines. This also  includes: Medicines to assist with hair growth, such as finasteride . Any recent exposure to a medicine called diethylstilbestrol (DES). Medicines such as male hormones (like testosterone ) or other medicines that raise testosterone  levels. Any bleeding problems you have. Any recent procedures you have had, especially any procedures involving the prostate or rectum. Any medical conditions you have. How are the results reported? Your test results will be reported as a value that indicates how much PSA is in your blood. This will be given as nanograms of PSA per milliliter of blood (ng/mL). Your health care provider will compare your results to normal ranges that were established after testing a large group of people (reference ranges). Reference ranges may vary among labs and hospitals. PSA levels vary from person to person and generally increase with age. Because of this variation, there is no single PSA value that is considered normal for everyone. Instead, PSA reference ranges are used to describe whether your PSA levels are considered low or high (elevated). Common reference ranges are: Low: 0-2.5 ng/mL. Slightly to moderately elevated: 2.6-10.0 ng/mL. Moderately elevated: 10.0-19.9 ng/mL. Significantly elevated: 20 ng/mL or greater. What do the results mean? A test result that is higher than 4 ng/mL may mean that you have prostate cancer. However, a PSA test by itself is not enough to diagnose prostate cancer. High PSA levels may also be caused by the natural aging process, prostate infection (prostatitis), or BPH. PSA screening cannot tell you if your PSA is high due to cancer or a different cause.  A prostate biopsy is the only way to diagnose prostate cancer. A risk of having the PSA test is diagnosing and treating prostate cancer that would never have caused any symptoms or problems (overdiagnosis and overtreatment). Talk with your health care provider about what your results mean. In some  cases, your health care provider may do more testing to confirm the results. Questions to ask your health care provider Ask your health care provider, or the department that is doing the test: When will my results be ready? How will I get my results? What are my treatment options? What other tests do I need? What are my next steps? Summary The prostate-specific antigen (PSA) test is a screening test for prostate cancer. Your health care provider may recommend that you have a PSA test starting at age 47 or that you have one earlier if you are at higher risk for prostate cancer. A test result that is higher than 4 ng/mL may mean that you have prostate cancer. However, elevated levels can be caused by a number of conditions other than prostate cancer. Talk with your health care provider about what your results mean. This information is not intended to replace advice given to you by your health care provider. Make sure you discuss any questions you have with your health care provider. Document Revised: 12/02/2023 Document Reviewed: 05/30/2020 Elsevier Patient Education  2025 [employer].

## 2024-03-11 ENCOUNTER — Ambulatory Visit (INDEPENDENT_AMBULATORY_CARE_PROVIDER_SITE_OTHER): Admitting: Audiology

## 2024-03-11 ENCOUNTER — Ambulatory Visit (INDEPENDENT_AMBULATORY_CARE_PROVIDER_SITE_OTHER): Admitting: Physician Assistant

## 2024-03-11 DIAGNOSIS — H90A32 Mixed conductive and sensorineural hearing loss, unilateral, left ear with restricted hearing on the contralateral side: Secondary | ICD-10-CM

## 2024-03-11 NOTE — Progress Notes (Unsigned)
 Dear Dr. Antonetta, Here is my assessment for our mutual patient, Thomas Malone. Thank you for allowing me the opportunity to care for your patient. Please do not hesitate to contact me should you have any other questions. Sincerely, Chyrl Cohen PA-C  Otolaryngology Clinic Note Referring provider: Dr. Antonetta HPI:  Thomas Malone is a 74 y.o. male kindly referred by Dr. Antonetta    Visit conducted via real-time audio/video telehealth platform. Patient location: [place] . Provider location: [place] . Patient identity verified. Consent for telehealth obtained. History and exam performed within limitations of telehealth. Total time spent: 5  Mr. Peel was last seen in the office on 02/10/2024.  At that time he was diagnosed with mixed hearing loss.  He notes a gradual hearing loss over time.  He did have acute worsening of his hearing describing a sensation of being underwater.  He did have progressive improvement and is now back to his baseline.  He had a repeat audiogram today.  He notes he continues to use Nasonex nasal spray.  Independent Review of Additional Tests or Records:  Audiological evaluation 03/11/2024     PMH/Meds/All/SocHx/FamHx/ROS:   Past Medical History:  Diagnosis Date   Abnormal CT of the chest 01/23/2014   BPH (benign prostatic hyperplasia) 05/31/2018   Complication of anesthesia    hard to wake up    Diabetes mellitus without complication (HCC)    Elevated PSA    Erectile dysfunction    Gastroesophageal reflux    H/O sarcoidosis    Headache    7-8 months from gabapentin  and naproxen   Hypertension    Pneumonia of upper lobe of lung 12/1213   Sleep apnea      Past Surgical History:  Procedure Laterality Date   ANTERIOR CERVICAL DECOMP/DISCECTOMY FUSION N/A 05/28/2018   CATARACT EXTRACTION W/PHACO Left 04/11/2019   Procedure: CATARACT EXTRACTION PHACO AND INTRAOCULAR LENS PLACEMENT (IOC) (CDE:5.80);  Surgeon: Harrie Agent, MD;  Location: [facility];   Service: Ophthalmology;  Laterality: Left;   CATARACT EXTRACTION W/PHACO Right 06/03/2019   Procedure: CATARACT EXTRACTION PHACO AND INTRAOCULAR LENS PLACEMENT RIGHT EYE;  Surgeon: Harrie Agent, MD;  Location: [facility];  Service: Ophthalmology;  Laterality: Right;  CDE: 9.82   COLONOSCOPY     COLONOSCOPY N/A 06/23/2014   Procedure: COLONOSCOPY;  Surgeon: Oneil Budge Md, MD;  Location: [facility];  Service: Gastroenterology;  Laterality: N/A;   ELBOW SURGERY Right    2021   EYE SURGERY Bilateral    lasic   ORTHOPEDIC SURGERY Left    heel surgery   PENILE PROSTHESIS IMPLANT N/A 07/01/2023   Procedure: INSERTION OF INFLATABLE PENILE PROSTHESIS;  Surgeon: Lovie Arlyss CROME, MD;  Location: [facility];  Service: Urology;  Laterality: N/A;  120 MINUTES NEEDED FOR CASE   PROSTATE BIOPSY     SHOULDER ARTHROSCOPY Right    repair of tendons and ligaments   TONSILLECTOMY     ULNAR NERVE TRANSPOSITION Right 08/28/2022   Procedure: RIGHT ULNAR NERVE DECOMPRESSION WITH TRANSPOSITION POSSIBLE SUBMUSCULAR TRANSPOSITION;  Surgeon: Murrell Drivers, MD;  Location: [facility];  Service: Orthopedics;  Laterality: Right;  120 MIN   WRIST SURGERY     2021    Family History  Problem Relation Age of Onset   Lymphoma Mother    Heart disease Father    Hyperlipidemia Sister    Diabetes Sister    Heart disease Sister    Breast cancer Sister    Hyperlipidemia Brother  Diabetes Brother      Social Connections: Moderately Integrated (10/07/2022)   Social Connection and Isolation Panel    Frequency of Communication with Friends and Family: More than three times a week    Frequency of Social Gatherings with Friends and Family: More than three times a week    Attends Religious Services: More than 4 times per year    Active Member of [employer] or Organizations: No    Attends Banker Meetings: Never    Marital Status: Married     Current Medications[1]   Physical Exam:    There were no vitals taken for this visit.  Pertinent Findings  Telephone visit  History and exam performed within limitations of telehealth. Patient speaking in full sentences in no acute distress    Seprately Identifiable Procedures:  None  Impression & Plans:  Thomas Malone is a 74 y.o. male with the following    Mixed hearing loss.  Patient notes he is back at his baseline.  He does have minimal air-bone gaps.  He also has bilateral type C tympanometry.  I would recommend continued treatment of allergic rhinitis as this is probably contributing.  I do also recommend a repeat audiological evaluation in 1 year.  The patient may pursue hearing aids at this time as it would likely be beneficial to him.  He understand and verbalized agreement to today's plan.  - f/u 1 year   Thank you for allowing me the opportunity to care for your patient. Please do not hesitate to contact me should you have any other questions.  Sincerely, Chyrl Cohen PA-C  [facility] Phone: [phone] Fax: [phone]  03/11/2024, 12:29 PM      [1]  Current Outpatient Medications:    alfuzosin  (UROXATRAL ) 10 MG 24 hr tablet, Take 1 tablet (10 mg total) by mouth in the morning and at bedtime., Disp: 180 tablet, Rfl: 3   amLODipine  (NORVASC ) 10 MG tablet, Take 0.5 tablets (5 mg total) by mouth daily. (Patient not taking: Reported on 02/10/2024), Disp: 90 tablet, Rfl: 3   aspirin EC 81 MG tablet, Take 81 mg by mouth daily. Swallow whole., Disp: , Rfl:    Blood Glucose Monitoring Suppl DEVI, 1 each by Does not apply route as directed. Substitute to Accu Chek Guide per patient's insurance. Use as directed for twice daily testing (FOR ICD-10 E10.9, E11.9)., Disp: 1 each, Rfl: 0   cyanocobalamin  (VITAMIN B12) 1000 MCG/ML injection, Inject 1 mL (1,000 mcg total) into the muscle every 30 (thirty) days., Disp: 6 mL, Rfl: 0   DULoxetine  (CYMBALTA ) 30 MG capsule, Take 1 capsule by mouth once daily,  Disp: 30 capsule, Rfl: 0   finasteride  (PROSCAR ) 5 MG tablet, Take 1 tablet (5 mg total) by mouth daily., Disp: 90 tablet, Rfl: 3   fluticasone  (FLONASE ) 50 MCG/ACT nasal spray, Place 2 sprays into both nostrils daily., Disp: 16 g, Rfl: 6   FREESTYLE TEST STRIPS test strip, USE AS DIRECTED TO  CHECK  TWICE  DAILY, Disp: 100 each, Rfl: 0   glipiZIDE  (GLUCOTROL ) 10 MG tablet, Take 1 tablet (10 mg total) by mouth 2 (two) times daily before a meal., Disp: 180 tablet, Rfl: 3   Glucosamine-Chondroitin (COSAMIN DS PO), Take 1 tablet by mouth daily., Disp: , Rfl:    Glucose Blood (BLOOD GLUCOSE TEST STRIPS) STRP, 1 each by Does not apply route as directed. Substitute to Accu Chek Guide per patient's insurance. Use as directed for twice daily testing (FOR  ICD-10 E10.9, E11.9)., Disp: 100 strip, Rfl: 2   irbesartan  (AVAPRO ) 150 MG tablet, Take 1 tablet (150 mg total) by mouth daily., Disp: 90 tablet, Rfl: 1   Lancet Device MISC, 1 each by Does not apply route as directed. Substitute to Accu Chek Guide per patient's insurance. Use as directed for twice daily testing (FOR ICD-10 E10.9, E11.9)., Disp: 1 each, Rfl: 0   Lancets (FREESTYLE) lancets, USE 1 LANCET TO CHECK GLUCOSE TWICE DAILY, Disp: 100 each, Rfl: 0   Lancets MISC, 1 each by Does not apply route as directed. Substitute to Accu Chek Guide per patient's insurance. Use as directed for twice daily testing (FOR ICD-10 E10.9, E11.9)., Disp: 100 each, Rfl: 2   loratadine  (CLARITIN ) 10 MG tablet, Take 1 tablet (10 mg total) by mouth daily., Disp: 30 tablet, Rfl: 11   Multiple Vitamin (MULTIVITAMIN WITH MINERALS) TABS tablet, Take 1 tablet by mouth daily., Disp: , Rfl:    NEEDLE, DISP, 25 G (BD DISP NEEDLES) 25G X 5/8 MISC, Patient to use with B 12 injection, Disp: 12 each, Rfl: 0   omeprazole  (PRILOSEC) 40 MG capsule, TAKE 1 CAPSULE BY MOUTH EVERY OTHER DAY, Disp: 50 capsule, Rfl: 0   pravastatin  (PRAVACHOL ) 20 MG tablet, Take 1 tablet (20 mg total) by mouth  daily., Disp: 90 tablet, Rfl: 3

## 2024-03-11 NOTE — Progress Notes (Signed)
" °  [address], [address] [place], [place] [address] [phone]  Audiological Evaluation    Name: Thomas Malone     DOB:   April 10, 1950      MRN:   [id number]                                                                                     Service Date: 03/11/2024     Accompanied by: wife and grandson   Patient comes today after Thomas Cohen, PA-C sent a referral for a hearing evaluation due to concerns with mixed hearing loss. Patient reports that today feels can hear better compared to the last time he was in the clinic.   Symptoms Yes Details  Hearing loss  [x]  11-06-2023-Right ear- Normal to severe mixed hearing loss from 125 Hz - 8000 Hz. Left ear-  Normal to moderate mixed hearing loss from 125 Hz - 8000 Hz.  Tinnitus  []    Ear pain/ infections/pressure  []    Balance problems  []    Noise exposure history  []    Previous ear surgeries  []    Family history of hearing loss  []    Amplification  []    Other  []      Otoscopy: Right ear: clear external ear canal and notable landmarks visualized on the tympanic membrane. Left ear:  clear external ear canal and notable landmarks visualized on the tympanic membrane.  Tympanometry: Right ear: Type C - Normal external ear canal volume with negative middle ear pressure and normal or reduced tympanic membrane compliance. Findings are consistent with Eustachian tube dysfunction.   Left ear: Type C - Normal external ear canal volume with negative middle ear pressure and normal or reduced tympanic membrane compliance. Findings are consistent with Eustachian tube dysfunction.     Hearing Evaluation The hearing test results were completed under headphones and results are deemed to be of good reliability. Test technique:  conventional    Pure tone Audiometry: Right ear- Normal to moderate sensorineural hearing loss from 250 Hz - 8000 Hz. Left ear-  Normal to moderate mixed hearing loss from 250 Hz - 8000 Hz.  Speech  Audiometry: Right ear- Speech Reception Threshold (SRT) was obtained at 20 dBHL. Left ear-Speech Reception Threshold (SRT) was obtained at 25 dBHL.   Word Recognition Score Tested using NU-6 (recorded) Right ear: 100% was obtained at a presentation level of 65 dBHL with contralateral masking which is deemed as  excellent. Left ear: 100% was obtained at a presentation level of 65 dBHL with contralateral masking which is deemed as  excellent.   Impression: Today's audiograms shows an improvement in the conductive component in both ears.   Recommendations: Follow up with ENT as scheduled. Repeat audiogram after medical care. Return for a hearing evaluation if concerns with hearing changes arise or per MD recommendation. Consider a communication needs assessment for amplification after medical clearance is obtained, if needed. Consider a repeat hearing test prior to a hearing aid fitting.   Raman Featherston MARIE LEROUX-MARTINEZ, AUD  "

## 2024-03-21 ENCOUNTER — Ambulatory Visit: Admitting: Orthopedic Surgery

## 2024-04-05 ENCOUNTER — Ambulatory Visit

## 2024-05-13 ENCOUNTER — Inpatient Hospital Stay

## 2024-05-19 ENCOUNTER — Inpatient Hospital Stay: Admitting: Oncology

## 2024-05-20 ENCOUNTER — Inpatient Hospital Stay: Admitting: Oncology

## 2024-09-08 ENCOUNTER — Other Ambulatory Visit

## 2024-09-14 ENCOUNTER — Ambulatory Visit: Admitting: Urology
